# Patient Record
Sex: Female | Born: 1950 | Race: Black or African American | Hispanic: No | Marital: Single | Smoking: Never smoker
Health system: Southern US, Community
[De-identification: ages and names within clinical notes are randomized; demographics above are authoritative.]

## PROBLEM LIST (undated history)

## (undated) DIAGNOSIS — M1990 Unspecified osteoarthritis, unspecified site: Secondary | ICD-10-CM

## (undated) DIAGNOSIS — E785 Hyperlipidemia, unspecified: Secondary | ICD-10-CM

## (undated) DIAGNOSIS — K219 Gastro-esophageal reflux disease without esophagitis: Secondary | ICD-10-CM

## (undated) DIAGNOSIS — M109 Gout, unspecified: Secondary | ICD-10-CM

## (undated) DIAGNOSIS — C50919 Malignant neoplasm of unspecified site of unspecified female breast: Secondary | ICD-10-CM

## (undated) DIAGNOSIS — I10 Essential (primary) hypertension: Secondary | ICD-10-CM

## (undated) DIAGNOSIS — D249 Benign neoplasm of unspecified breast: Secondary | ICD-10-CM

## (undated) HISTORY — PX: ABDOMINAL HYSTERECTOMY: SHX81

## (undated) HISTORY — PX: CYST EXCISION: SHX5701

## (undated) HISTORY — DX: Unspecified osteoarthritis, unspecified site: M19.90

## (undated) HISTORY — DX: Malignant neoplasm of unspecified site of unspecified female breast: C50.919

## (undated) HISTORY — PX: KNEE SURGERY: SHX244

## (undated) HISTORY — DX: Hyperlipidemia, unspecified: E78.5

---

## 1999-02-04 ENCOUNTER — Emergency Department (HOSPITAL_COMMUNITY): Admission: EM | Admit: 1999-02-04 | Discharge: 1999-02-04 | Payer: Self-pay | Admitting: *Deleted

## 2000-02-29 ENCOUNTER — Other Ambulatory Visit: Admission: RE | Admit: 2000-02-29 | Discharge: 2000-02-29 | Payer: Self-pay | Admitting: Obstetrics

## 2000-03-15 ENCOUNTER — Encounter: Payer: Self-pay | Admitting: Obstetrics

## 2000-03-15 ENCOUNTER — Ambulatory Visit (HOSPITAL_COMMUNITY): Admission: RE | Admit: 2000-03-15 | Discharge: 2000-03-15 | Payer: Self-pay | Admitting: Obstetrics

## 2003-09-16 ENCOUNTER — Emergency Department (HOSPITAL_COMMUNITY): Admission: EM | Admit: 2003-09-16 | Discharge: 2003-09-16 | Payer: Self-pay | Admitting: Emergency Medicine

## 2003-12-09 ENCOUNTER — Encounter: Admission: RE | Admit: 2003-12-09 | Discharge: 2003-12-09 | Payer: Self-pay | Admitting: Family Medicine

## 2004-08-26 ENCOUNTER — Emergency Department (HOSPITAL_COMMUNITY): Admission: EM | Admit: 2004-08-26 | Discharge: 2004-08-26 | Payer: Self-pay | Admitting: Emergency Medicine

## 2005-08-12 ENCOUNTER — Ambulatory Visit: Payer: Self-pay | Admitting: Internal Medicine

## 2005-08-13 ENCOUNTER — Ambulatory Visit (HOSPITAL_COMMUNITY): Admission: RE | Admit: 2005-08-13 | Discharge: 2005-08-13 | Payer: Self-pay | Admitting: Internal Medicine

## 2005-08-16 ENCOUNTER — Ambulatory Visit: Payer: Self-pay | Admitting: Internal Medicine

## 2005-10-12 ENCOUNTER — Ambulatory Visit: Payer: Self-pay | Admitting: Family Medicine

## 2005-11-03 ENCOUNTER — Encounter: Admission: RE | Admit: 2005-11-03 | Discharge: 2005-11-25 | Payer: Self-pay | Admitting: Orthopedic Surgery

## 2005-12-03 ENCOUNTER — Ambulatory Visit: Payer: Self-pay | Admitting: Family Medicine

## 2005-12-08 ENCOUNTER — Ambulatory Visit: Payer: Self-pay | Admitting: Family Medicine

## 2005-12-23 ENCOUNTER — Ambulatory Visit (HOSPITAL_COMMUNITY): Admission: RE | Admit: 2005-12-23 | Discharge: 2005-12-23 | Payer: Self-pay | Admitting: Internal Medicine

## 2005-12-24 ENCOUNTER — Ambulatory Visit: Payer: Self-pay | Admitting: Family Medicine

## 2006-01-24 ENCOUNTER — Ambulatory Visit: Payer: Self-pay | Admitting: Family Medicine

## 2006-03-28 ENCOUNTER — Ambulatory Visit: Payer: Self-pay | Admitting: Family Medicine

## 2006-07-28 ENCOUNTER — Emergency Department (HOSPITAL_COMMUNITY): Admission: EM | Admit: 2006-07-28 | Discharge: 2006-07-28 | Payer: Self-pay | Admitting: Family Medicine

## 2007-01-18 ENCOUNTER — Encounter (INDEPENDENT_AMBULATORY_CARE_PROVIDER_SITE_OTHER): Payer: Self-pay | Admitting: Family Medicine

## 2007-03-07 DIAGNOSIS — I10 Essential (primary) hypertension: Secondary | ICD-10-CM | POA: Insufficient documentation

## 2007-03-07 DIAGNOSIS — E785 Hyperlipidemia, unspecified: Secondary | ICD-10-CM | POA: Insufficient documentation

## 2007-03-07 DIAGNOSIS — M62838 Other muscle spasm: Secondary | ICD-10-CM | POA: Insufficient documentation

## 2007-03-07 DIAGNOSIS — M25519 Pain in unspecified shoulder: Secondary | ICD-10-CM | POA: Insufficient documentation

## 2007-04-28 ENCOUNTER — Telehealth (INDEPENDENT_AMBULATORY_CARE_PROVIDER_SITE_OTHER): Payer: Self-pay | Admitting: *Deleted

## 2007-04-28 ENCOUNTER — Ambulatory Visit: Payer: Self-pay | Admitting: Internal Medicine

## 2007-05-03 ENCOUNTER — Ambulatory Visit: Payer: Self-pay | Admitting: *Deleted

## 2007-05-03 ENCOUNTER — Ambulatory Visit: Payer: Self-pay | Admitting: Family Medicine

## 2007-05-03 LAB — CONVERTED CEMR LAB
ALT: 26 units/L (ref 0–35)
AST: 15 units/L (ref 0–37)
Albumin: 4.7 g/dL (ref 3.5–5.2)
Alkaline Phosphatase: 72 units/L (ref 39–117)
BUN: 24 mg/dL — ABNORMAL HIGH (ref 6–23)
Bilirubin Urine: NEGATIVE
Blood in Urine, dipstick: NEGATIVE
CO2: 23 meq/L (ref 19–32)
Calcium: 9.3 mg/dL (ref 8.4–10.5)
Chloride: 104 meq/L (ref 96–112)
Cholesterol: 206 mg/dL — ABNORMAL HIGH (ref 0–200)
Creatinine, Ser: 0.79 mg/dL (ref 0.40–1.20)
Glucose, Bld: 92 mg/dL (ref 70–99)
Glucose, Urine, Semiquant: NEGATIVE
HDL: 53 mg/dL (ref >39)
Ketones, urine, test strip: NEGATIVE
LDL Cholesterol: 113 mg/dL — ABNORMAL HIGH (ref 0–99)
Nitrite: NEGATIVE
Potassium: 4.5 meq/L (ref 3.5–5.3)
Sodium: 140 meq/L (ref 135–145)
Specific Gravity, Urine: 1.015
Total Bilirubin: 1.1 mg/dL (ref 0.3–1.2)
Total CHOL/HDL Ratio: 3.9
Total Protein: 7.7 g/dL (ref 6.0–8.3)
Triglycerides: 201 mg/dL — ABNORMAL HIGH (ref <150)
Urobilinogen, UA: NEGATIVE
VLDL: 40 mg/dL (ref 0–40)
WBC Urine, dipstick: NEGATIVE
pH: 5

## 2007-11-20 ENCOUNTER — Emergency Department (HOSPITAL_COMMUNITY): Admission: EM | Admit: 2007-11-20 | Discharge: 2007-11-20 | Payer: Self-pay | Admitting: Emergency Medicine

## 2008-05-13 ENCOUNTER — Telehealth (INDEPENDENT_AMBULATORY_CARE_PROVIDER_SITE_OTHER): Payer: Self-pay | Admitting: *Deleted

## 2008-06-05 ENCOUNTER — Ambulatory Visit: Payer: Self-pay | Admitting: Family Medicine

## 2008-06-05 LAB — CONVERTED CEMR LAB
ALT: 20 units/L (ref 0–35)
AST: 16 units/L (ref 0–37)
Albumin: 4.6 g/dL (ref 3.5–5.2)
Alkaline Phosphatase: 82 units/L (ref 39–117)
BUN: 29 mg/dL — ABNORMAL HIGH (ref 6–23)
Basophils Absolute: 0 10*3/uL (ref 0.0–0.1)
Basophils Relative: 1 % (ref 0–1)
CO2: 23 meq/L (ref 19–32)
Calcium: 9.4 mg/dL (ref 8.4–10.5)
Chloride: 105 meq/L (ref 96–112)
Cholesterol: 185 mg/dL (ref 0–200)
Creatinine, Ser: 0.87 mg/dL (ref 0.40–1.20)
Eosinophils Absolute: 0.1 10*3/uL (ref 0.0–0.7)
Eosinophils Relative: 3 % (ref 0–5)
Glucose, Bld: 102 mg/dL — ABNORMAL HIGH (ref 70–99)
HCT: 37.2 % (ref 36.0–46.0)
HDL: 49 mg/dL (ref >39)
Hemoglobin: 12.1 g/dL (ref 12.0–15.0)
LDL Cholesterol: 104 mg/dL — ABNORMAL HIGH (ref 0–99)
Lymphocytes Relative: 38 % (ref 12–46)
Lymphs Abs: 1.7 10*3/uL (ref 0.7–4.0)
MCHC: 32.5 g/dL (ref 30.0–36.0)
MCV: 97.9 fL (ref 78.0–100.0)
Monocytes Absolute: 0.4 10*3/uL (ref 0.1–1.0)
Monocytes Relative: 8 % (ref 3–12)
Neutro Abs: 2.3 10*3/uL (ref 1.7–7.7)
Neutrophils Relative %: 50 % (ref 43–77)
Platelets: 258 10*3/uL (ref 150–400)
Potassium: 4.5 meq/L (ref 3.5–5.3)
RBC: 3.8 M/uL — ABNORMAL LOW (ref 3.87–5.11)
RDW: 12.7 % (ref 11.5–15.5)
Sodium: 140 meq/L (ref 135–145)
TSH: 0.853 microintl units/mL (ref 0.350–4.50)
Total Bilirubin: 0.7 mg/dL (ref 0.3–1.2)
Total CHOL/HDL Ratio: 3.8
Total Protein: 7.7 g/dL (ref 6.0–8.3)
Triglycerides: 160 mg/dL — ABNORMAL HIGH (ref <150)
VLDL: 32 mg/dL (ref 0–40)
WBC: 4.5 10*3/uL (ref 4.0–10.5)

## 2008-06-06 ENCOUNTER — Encounter (INDEPENDENT_AMBULATORY_CARE_PROVIDER_SITE_OTHER): Payer: Self-pay | Admitting: Family Medicine

## 2008-06-07 ENCOUNTER — Ambulatory Visit (HOSPITAL_COMMUNITY): Admission: RE | Admit: 2008-06-07 | Discharge: 2008-06-07 | Payer: Self-pay | Admitting: Family Medicine

## 2008-07-03 ENCOUNTER — Encounter (INDEPENDENT_AMBULATORY_CARE_PROVIDER_SITE_OTHER): Payer: Self-pay | Admitting: Family Medicine

## 2008-07-16 ENCOUNTER — Encounter (INDEPENDENT_AMBULATORY_CARE_PROVIDER_SITE_OTHER): Payer: Self-pay | Admitting: Family Medicine

## 2008-09-10 ENCOUNTER — Encounter (INDEPENDENT_AMBULATORY_CARE_PROVIDER_SITE_OTHER): Payer: Self-pay | Admitting: Family Medicine

## 2008-09-10 ENCOUNTER — Ambulatory Visit: Payer: Self-pay | Admitting: Family Medicine

## 2008-09-10 LAB — CONVERTED CEMR LAB
Bilirubin Urine: NEGATIVE
Blood in Urine, dipstick: NEGATIVE
Glucose, Urine, Semiquant: NEGATIVE
Nitrite: NEGATIVE
Protein, U semiquant: NEGATIVE
Specific Gravity, Urine: 1.02
Urobilinogen, UA: 0.2
WBC Urine, dipstick: NEGATIVE
pH: 5

## 2008-09-16 ENCOUNTER — Encounter (INDEPENDENT_AMBULATORY_CARE_PROVIDER_SITE_OTHER): Payer: Self-pay | Admitting: Family Medicine

## 2009-05-23 ENCOUNTER — Telehealth: Payer: Self-pay | Admitting: Physician Assistant

## 2009-05-30 ENCOUNTER — Ambulatory Visit: Payer: Self-pay | Admitting: Physician Assistant

## 2009-09-26 ENCOUNTER — Ambulatory Visit: Payer: Self-pay | Admitting: Physician Assistant

## 2009-09-26 DIAGNOSIS — R1013 Epigastric pain: Secondary | ICD-10-CM

## 2009-09-26 DIAGNOSIS — K3189 Other diseases of stomach and duodenum: Secondary | ICD-10-CM | POA: Insufficient documentation

## 2009-09-26 LAB — CONVERTED CEMR LAB
ALT: 13 units/L (ref 0–35)
AST: 14 units/L (ref 0–37)
Albumin: 4.4 g/dL (ref 3.5–5.2)
Alkaline Phosphatase: 58 units/L (ref 39–117)
BUN: 18 mg/dL (ref 6–23)
Basophils Absolute: 0 10*3/uL (ref 0.0–0.1)
Basophils Relative: 0 % (ref 0–1)
Bilirubin Urine: NEGATIVE
Blood in Urine, dipstick: NEGATIVE
CO2: 25 meq/L (ref 19–32)
Calcium: 8.9 mg/dL (ref 8.4–10.5)
Chloride: 104 meq/L (ref 96–112)
Creatinine, Ser: 0.97 mg/dL (ref 0.40–1.20)
Eosinophils Absolute: 0.2 10*3/uL (ref 0.0–0.7)
Eosinophils Relative: 3 % (ref 0–5)
Glucose, Bld: 71 mg/dL (ref 70–99)
Glucose, Urine, Semiquant: NEGATIVE
HCT: 37.7 % (ref 36.0–46.0)
Hemoglobin: 12.1 g/dL (ref 12.0–15.0)
Ketones, urine, test strip: NEGATIVE
Lymphocytes Relative: 34 % (ref 12–46)
Lymphs Abs: 1.9 10*3/uL (ref 0.7–4.0)
MCHC: 32.1 g/dL (ref 30.0–36.0)
MCV: 96.2 fL (ref 78.0–100.0)
Monocytes Absolute: 0.4 10*3/uL (ref 0.1–1.0)
Monocytes Relative: 7 % (ref 3–12)
Neutro Abs: 3.1 10*3/uL (ref 1.7–7.7)
Neutrophils Relative %: 56 % (ref 43–77)
Nitrite: NEGATIVE
Platelets: 264 10*3/uL (ref 150–400)
Potassium: 4 meq/L (ref 3.5–5.3)
Protein, U semiquant: NEGATIVE
RBC: 3.92 M/uL (ref 3.87–5.11)
RDW: 12.9 % (ref 11.5–15.5)
Rapid HIV Screen: NEGATIVE
Sodium: 141 meq/L (ref 135–145)
Specific Gravity, Urine: >=1.03
TSH: 1.095 microintl units/mL (ref 0.350–4.500)
Total Bilirubin: 0.6 mg/dL (ref 0.3–1.2)
Total Protein: 7.3 g/dL (ref 6.0–8.3)
Urobilinogen, UA: 0.2
WBC Urine, dipstick: NEGATIVE
WBC: 5.5 10*3/uL (ref 4.0–10.5)
pH: 6

## 2009-09-29 ENCOUNTER — Ambulatory Visit (HOSPITAL_COMMUNITY): Admission: RE | Admit: 2009-09-29 | Discharge: 2009-09-29 | Payer: Self-pay | Admitting: Internal Medicine

## 2009-09-30 ENCOUNTER — Ambulatory Visit: Payer: Self-pay | Admitting: Physician Assistant

## 2009-10-01 ENCOUNTER — Encounter: Payer: Self-pay | Admitting: Physician Assistant

## 2009-10-01 LAB — CONVERTED CEMR LAB
Cholesterol, target level: 200 mg/dL
Cholesterol: 182 mg/dL (ref 0–200)
HDL goal, serum: 40 mg/dL
HDL: 50 mg/dL (ref >39)
LDL Cholesterol: 101 mg/dL — ABNORMAL HIGH (ref 0–99)
LDL Goal: 130 mg/dL
Total CHOL/HDL Ratio: 3.6
Triglycerides: 154 mg/dL — ABNORMAL HIGH (ref <150)
VLDL: 31 mg/dL (ref 0–40)

## 2010-05-19 NOTE — Letter (Signed)
Summary: COLONOSCOPY  COLONOSCOPY   Imported By: Arta Bruce 08/15/2008 12:37:28  _____________________________________________________________________  External Attachment:    Type:   Image     Comment:   External Document  Appended Document: COLONOSCOPY    Clinical Lists Changes  Observations: Added new observation of COLONRECACT: Further recommendations pending biopsy results.  Repeat colonoscopy in 5 years. Repeat colonoscopy in 10 years.   (07/16/2008 9:14) Added new observation of COLONOSCOPY:  Results: Polyp.  Results: Hemorrhoids.     Results: Diverticulosis.        (07/16/2008 9:14)       Colonoscopy  Procedure date:  07/16/2008  Findings:       Results: Polyp.  Results: Hemorrhoids.     Results: Diverticulosis.         Comments:      Further recommendations pending biopsy results.  Repeat colonoscopy in 5 years. Repeat colonoscopy in 10 years.

## 2010-05-19 NOTE — Progress Notes (Signed)
Summary: med request  Phone Note Call from Patient Call back at [facility] Phone [phone]   Caller: Patient Summary of Call: THE PT IS OUT FROM HER CHOLESTEROL MEDICATION AND SHE WOULD LIKE TO GET SOME THAT CAN LAST UNTIL HER VISIT.  [facility] ([address]) [phone] DR Barbaraann Barthel Initial call taken by: Manon Hilding,  May 13, 2008 4:08 PM  Follow-up for Phone Call        forward to Miran Kautzman Follow-up by: [place] Shannon,  May 14, 2008 9:40 AM  Additional Follow-up for Phone Call Additional follow up Details #1::        Pt has not been seen in 1 year. She must come to fasting labs in next week....FLP,CMET. This is last refill she will receive without labs.Done via escript request(separate document) Additional Follow-up by: Beverley Fiedler MD,  May 15, 2008 3:04 PM    Additional Follow-up for Phone Call Additional follow up Details #2::    spoke with patient and she already had an appt scheduled to f/u with Lynetta Tomczak on Feb 17. pt advised to come in fasting that day and she can still pick up her refill on medications but she needs to keep f/u appt with Justen Fonda........ Follow-up by: Mikey College CMA,  May 16, 2008 10:47 AM

## 2010-05-19 NOTE — Assessment & Plan Note (Signed)
Summary: CPP////KT   Vital Signs:  Patient profile:   60 year old female Height:      60 inches Weight:      196 pounds Temp:     97.7 degrees F oral Pulse rate:   60 / minute Pulse rhythm:   regular Resp:     18 per minute BP sitting:   120 / 78  (left arm) Cuff size:   regular  Vitals Entered By: [place] Shannon (Sep 10, 2008 2:08 PM)  History of Present Illness: Here for CPP. Had hysterectomy for fibroids in past. Denies vaginal bleeding or incontinence issues. Widow. Not sexually active. Has had her blood work,mammogram, and colonoscopy. Need Path report from GI,Dr.Hung. Pt had one polyp and reports that Dr.Hung told her it was benign.  Habits & Providers     Tobacco Status: never     Drug Use: no  Current Medications (verified): 1)  Benazepril-Hydrochlorothiazide 10-12.5 Mg  Tabs (Benazepril-Hydrochlorothiazide) .Marland Kitchen.. 1 Tab By Mouth Daily 2)  Pravachol 40 Mg  Tabs (Pravastatin Sodium) .Marland Kitchen.. 1 Tab By Mouth Daily 3)  Diclofenac Sodium 75 Mg Tbec (Diclofenac Sodium) .Marland Kitchen.. 1 By Mouth Two Times A Day As Needed 4)  Nabumetone 750 Mg Tabs (Nabumetone) .Marland Kitchen.. 1 By Mouth Two Times A Day As Needed 5)  Aspirin Adult Low Strength 81 Mg Tbec (Aspirin) .Marland Kitchen.. 1 By Mouth Once Daily  Allergies (verified): No Known Drug Allergies  Past History:  Past Medical History:    Hypertension    Hyperlipidemia     (06/05/2008)  Past Surgical History:    s/p right knee arthroscopy 2008.Marland KitchenMarland KitchenDr.Duda.    s/p TAH 1990(fibroids)     (06/05/2008)  Social History:    Never Smoked    Alcohol use-yes...occasional beer on weekend.    Drug use-yes...occasional MJ    Widow/Widower    No sexual partner.    Alcohol use-yes...occasional 4 beer on weekend.    Drug use-no     (09/10/2008)  Social History:    Never Smoked    Alcohol use-yes...occasional beer on weekend.    Drug use-yes...occasional MJ    Widow/Widower    No sexual partner.    Alcohol use-yes...occasional 4 beer on weekend.    Drug  use-no    Drug Use:  no  Review of Systems  The patient denies anorexia, chest pain, syncope, dyspnea on exertion, peripheral edema, prolonged cough, abdominal pain, melena, and hematochezia.   ENT:  Denies decreased hearing. CV:  Denies chest pain or discomfort, fainting, palpitations, and shortness of breath with exertion. Resp:  Denies cough. GI:  Denies abdominal pain, bloody stools, change in bowel habits, dark tarry stools, nausea, and vomiting; some heartburn with greasy foods.. GU:  Denies abnormal vaginal bleeding, discharge, and dysuria. Derm:  Denies poor wound healing and rash; No new rashes or changes in moles.. Neuro:  Denies falling down. Psych:  Denies anxiety and depression.  Physical Exam  General:  Well-developed,well-nourished,in no acute distress; alert,appropriate and cooperative throughout examination Head:  Normocephalic and atraumatic without obvious abnormalities. No apparent alopecia or balding. Eyes:  pupils equal, pupils round, pupils reactive to light, and no injection.   Ears:  External ear exam shows no significant lesions or deformities.  Otoscopic examination reveals clear canals, tympanic membranes are intact bilaterally without bulging, retraction, inflammation or discharge. Hearing is grossly normal bilaterally. Nose:  External nasal examination shows no deformity or inflammation. Nasal mucosa are pink and moist without lesions or exudates. Mouth:  Oral mucosa  and oropharynx without lesions or exudates.  Has had lost teeth but no evidence of current caries. Pt says last cleaned 2 years ago and she plans to return to her dentist. Neck:  supple, no thyromegaly, and no carotid bruits.   Breasts:  No mass, nodules, thickening, tenderness, bulging, retraction, inflamation, nipple discharge or skin changes noted.   Lungs:  Normal respiratory effort, chest expands symmetrically. Lungs are clear to auscultation, no crackles or wheezes. Heart:  Normal rate and  regular rhythm. S1 and S2 normal without gallop, murmur, click, rub or other extra sounds. Abdomen:  Bowel sounds positive,abdomen soft and non-tender without masses, organomegaly or hernias noted. Rectal:  deferred Genitalia:  Pelvic Exam:        External: normal female genitalia without lesions or masses        Vagina: normal without lesions or masses        Cervix: absent        Adnexa: normal bimanual exam without masses or fullness        Uterus: absent        Pap smear: vaginal wall smear. Msk:  No deformity or scoliosis noted of thoracic or lumbar spine.   Extremities:  No CCE Neurologic:  alert & oriented X3 and cranial nerves II-XII intact grossly.  Skin:  Intact without suspicious lesions or rashes Cervical Nodes:  No lymphadenopathy noted Axillary Nodes:  No palpable lymphadenopathy Psych:  Oriented X3, memory intact for recent and remote, normally interactive, good eye contact, not anxious appearing, and not depressed appearing.     Impression & Recommendations:  Problem # 1:  EXAMINATION, ROUTINE MEDICAL (ICD-V70.0) Had Mammogram. Had screening colonoscopy. Had bloodwork 05/2008. Tdap 05/2008. Orders: Thin Prep Pap ([id number]) Pap Smear, Thin Prep ( Collection of) ([id number]) UA Dipstick w/o Micro (automated)  (81003)  Problem # 2:  HYPERTENSION (ICD-401.9) At goal. Her updated medication list for this problem includes:    Benazepril-hydrochlorothiazide 10-12.5 Mg Tabs (Benazepril-hydrochlorothiazide) .Marland Kitchen... 1 tab by mouth daily  Problem # 3:  HYPERLIPIDEMIA (ICD-272.4) Stable.LDL 104. Her updated medication list for this problem includes:    Pravachol 40 Mg Tabs (Pravastatin sodium) .Marland Kitchen... 1 tab by mouth daily  Complete Medication List: 1)  Benazepril-hydrochlorothiazide 10-12.5 Mg Tabs (Benazepril-hydrochlorothiazide) .Marland Kitchen.. 1 tab by mouth daily 2)  Pravachol 40 Mg Tabs (Pravastatin sodium) .Marland Kitchen.. 1 tab by mouth daily 3)  Nabumetone 750 Mg Tabs (Nabumetone) .Marland Kitchen.. 1 by mouth  two times a day as needed 4)  Aspirin Adult Low Strength 81 Mg Tbec (Aspirin) .Marland Kitchen.. 1 by mouth once daily  Patient Instructions: 1)  Next visit  ~ October 2010 for BP check and fasting labs Prescriptions: PRAVACHOL 40 MG  TABS (PRAVASTATIN SODIUM) 1 tab by mouth daily  #30 Each x 5   Entered and Authorized by:   Beverley Fiedler MD   Signed by:   Beverley Fiedler MD on 09/10/2008   Method used:   Electronically to        [facility] [phone]* (retail)       [address]       [place], [place]  [address]       Ph: [phone]       Fax: [phone]   RxID:   [id number] BENAZEPRIL-HYDROCHLOROTHIAZIDE 10-12.5 MG  TABS (BENAZEPRIL-HYDROCHLOROTHIAZIDE) 1 tab by mouth daily  #30 Each x 5   Entered and Authorized by:   Beverley Fiedler MD   Signed by:   Beverley Fiedler MD on 09/10/2008   Method  used:   Electronically to        [facility] [phone]* (retail)       [address]       [place], [place]  [address]       Ph: [phone]       Fax: [phone]   RxID:   [id number]   Laboratory Results   Urine Tests  Date/Time Received: Sep 10, 2008 2:49 PM   Routine Urinalysis   Glucose: negative   (Normal Range: Negative) Bilirubin: negative   (Normal Range: Negative) Ketone: trace (5)   (Normal Range: Negative) Spec. Gravity: 1.020   (Normal Range: 1.003-1.035) Blood: negative   (Normal Range: Negative) pH: 5.0   (Normal Range: 5.0-8.0) Protein: negative   (Normal Range: Negative) Urobilinogen: 0.2   (Normal Range: 0-1) Nitrite: negative   (Normal Range: Negative) Leukocyte Esterace: negative   (Normal Range: Negative)

## 2010-05-19 NOTE — Letter (Signed)
Summary: [facility]  [facility]   Imported By: Arta Bruce 11/08/2008 11:38:51  _____________________________________________________________________  External Attachment:    Type:   Image     Comment:   External Document

## 2010-05-19 NOTE — Assessment & Plan Note (Signed)
Summary: RENEW MEDICATION/GK   Vital Signs:  Patient Profile:   60 Years Old Female Height:     60 inches Weight:      198.5 pounds BMI:     38.91 Pulse rhythm:   regular BP sitting:   120 / 82  (left arm) Cuff size:   regular  Pt. in pain?   yes    Location:   lt shoulder    Intensity:   7  Vitals Entered By: Vesta Mixer CMA (June 05, 2008 9:03 AM)              Is Patient Diabetic? No  Does patient need assistance? Ambulation Normal     Chief Complaint:  Left shoulder has been hurting x 2 months worse at night when she tries to sleep.Marland Kitchen  History of Present Illness: Pt not seen in > 1 year. Here for fasting labs and med refills. No acute issues.Reports compliance with her meds. No recent labs. Behind on multiple health maintenance issues.Does inform MD that she has Medicare(shows card).  Has 2 NSAID prescriptions from 2 orthopedists. MD discussed with her that she should not take both NSAIDS and should call her Ortho about which one he wishes her to take. Pt has seen her orthopedist for left shoulder(Dr.Duda) and was told it was "a nerve" and surgery was not recommended.Last saw Dr.Duda in 2009.    Prior Medications Reviewed Using: Medication Bottles  Current Allergies: No known allergies   Past Medical History:    Reviewed history and no changes required:       Hypertension       Hyperlipidemia  Past Surgical History:    Reviewed history and no changes required:       s/p right knee arthroscopy 2008.Marland KitchenMarland KitchenDr.Duda.       s/p TAH 1990(fibroids)   Social History:    Never Smoked    Alcohol use-yes...occasional beer on weekend.    Drug use-yes...occasional MJ   Risk Factors:  Tobacco use:  never Drug use:  yes Alcohol use:  yes   Review of Systems  The patient denies anorexia, chest pain, dyspnea on exertion, and prolonged cough.     Physical Exam  General:     Well-developed,well-nourished,in no acute distress; alert,appropriate and  cooperative throughout examination Lungs:     Normal respiratory effort, chest expands symmetrically. Lungs are clear to auscultation, no crackles or wheezes. Heart:     Normal rate and regular rhythm. S1 and S2 normal without gallop, murmur, click, rub or other extra sounds.    Impression & Recommendations:  Problem # 1:  HYPERTENSION (ICD-401.9)  Her updated medication list for this problem includes:    Benazepril-hydrochlorothiazide 10-12.5 Mg Tabs (Benazepril-hydrochlorothiazide) .Marland Kitchen... 1 tab by mouth daily  Orders: T-General Health Panel (CBCD, CMP, TSH) ([id number])   Problem # 2:  HYPERLIPIDEMIA (ICD-272.4)  Her updated medication list for this problem includes:    Pravachol 40 Mg Tabs (Pravastatin sodium) .Marland Kitchen... 1 tab by mouth daily  Orders: T-Lipid Profile ([id number])   Problem # 3:  Preventive Health Care (ICD-V70.0) Behind on multiple areas. Referral to GI for screening colonoscopy. Mammogram. Stool cards x 3. Tdap. F/U CPP in 4 months  Complete Medication List: 1)  Benazepril-hydrochlorothiazide 10-12.5 Mg Tabs (Benazepril-hydrochlorothiazide) .Marland Kitchen.. 1 tab by mouth daily 2)  Pravachol 40 Mg Tabs (Pravastatin sodium) .Marland Kitchen.. 1 tab by mouth daily 3)  Diclofenac Sodium 75 Mg Tbec (Diclofenac sodium) .Marland Kitchen.. 1 by mouth two times a day as needed 4)  Nabumetone 750 Mg Tabs (Nabumetone) .Marland Kitchen.. 1 by mouth two times a day as needed 5)  Aspirin Adult Low Strength 81 Mg Tbec (Aspirin) .Marland Kitchen.. 1 by mouth once daily  Other Orders: Gastroenterology Referral (GI) Mammogram (Screening) (Mammo)   Patient Instructions: 1)  You got a Tdap shot today. 2)  We are referring you to get a colonoscopy. 3)  Get yout mammogrm. 4)  See Dr.Rankins for CPP in 4 months   Prescriptions: PRAVACHOL 40 MG  TABS (PRAVASTATIN SODIUM) 1 tab by mouth daily  #30 Each x 4   Entered and Authorized by:   Beverley Fiedler MD   Signed by:   Beverley Fiedler MD on 06/05/2008   Method used:   Print then  Give to Patient   RxID:   [id number] BENAZEPRIL-HYDROCHLOROTHIAZIDE 10-12.5 MG  TABS (BENAZEPRIL-HYDROCHLOROTHIAZIDE) 1 tab by mouth daily  #30 Each x 4   Entered and Authorized by:   Beverley Fiedler MD   Signed by:   Beverley Fiedler MD on 06/05/2008   Method used:   Print then Give to Patient   RxID:   [id number]   Appended Document: RENEW MEDICATION/GK   Tetanus/Td Vaccine    Vaccine Type: Tdap    Site: right deltoid    Mfr: Sanofi Pasteur    Dose: 0.5 ml    Route: IM    Given by: Vesta Mixer CMA    Exp. Date: 06/14/2010    Lot #: [id number]    VIS given: 03/07/07 version given June 05, 2008.   Appended Document: hemoccult results  Laboratory Results    Stool - Occult Blood Hemmoccult #1: negative Date: 06/19/2008 Hemoccult #2: negative Date: 06/19/2008 Hemoccult #3: negative Date: 06/19/2008

## 2010-05-19 NOTE — Letter (Signed)
Summary: *HSN Results Follow up  [facility]  [address] [place], [place] [address]   Phone: [phone]  Fax: [phone]      06/06/2008   Sandy Morales [address] [place], [place]  [address]   Dear  Ms. [facility],                            ____S.Drinkard,FNP   ____D. Gore,FNP       ____B. McPherson,MD   __X__V. Margarie Mcguirt,MD    ____E. Mulberry,MD    ____N. Daphine Deutscher, FNP  ____D. Reche Dixon, MD    ____K. Philipp Deputy, MD    ____Other     This letter is to inform you that your recent test(s):  _______Pap Smear    __x____Lab Test     _______X-ray    ___X____ is within acceptable limits  _______ requires a medication change  _______ requires a follow-up lab visit  _______ requires a follow-up visit with your provider   Comments:       _________________________________________________________ If you have any questions, please contact our office                     Sincerely,  Beverley Fiedler MD [facility]

## 2010-05-19 NOTE — Assessment & Plan Note (Signed)
Summary: Sandy Morales PT//OUT OF BPS MEDS//KT   Vital Signs:  Patient Profile:   60 Years Old Female Temp:     203 degrees F 97.6 Pulse rate:   76 / minute Pulse rhythm:   regular Resp:     18 per minute BP sitting:   136 / 86  (left arm) Cuff size:   large  Pt. in pain?   yes    Location:   knee    Intensity:   6  Vitals Entered By: Vesta Mixer CMA (April 28, 2007 2:44 PM)              Is Patient Diabetic? No  Does patient need assistance? Ambulation Normal     Chief Complaint:  needs refill on meds out x 8 days [facility] [address]  History of Present Illness: 1.  Hypertension:  Out of meds for 8 days.  States did check pressures at pharmacy--sound borderline.  Pt. not a good historian regarding numbers obtained.  Minimal exercise.  Not really working on diet.  Describes a poor diet.  3 sodas daily.  Few vegetables.  2.  Hypercholesterolemia:  See above.  Little in way of fried food.  Current Allergies: No known allergies     Risk Factors:  Tobacco use:  never    Physical Exam  General:     obese. Lungs:     Normal respiratory effort, chest expands symmetrically. Lungs are clear to auscultation, no crackles or wheezes. Heart:     Normal rate and regular rhythm. S1 and S2 normal without gallop, murmur, click, rub or other extra sounds.  Radial pulses normal and equal.    Impression & Recommendations:  Problem # 1:  HYPERTENSION (ICD-401.9) Restart meds To return for fasting CMET. Her updated medication list for this problem includes:    Benazepril-hydrochlorothiazide 10-12.5 Mg Tabs (Benazepril-hydrochlorothiazide) .Marland Kitchen... 1 tab by mouth daily   Problem # 2:  HYPERLIPIDEMIA (ICD-272.4) Restart meds To return for FLP The following medications were removed from the medication list:    Crestor 10 Mg Tabs (Rosuvastatin calcium)  Her updated medication list for this problem includes:    Pravachol 40 Mg Tabs (Pravastatin sodium) .Marland Kitchen... 1 tab by mouth  daily   Complete Medication List: 1)  Benazepril-hydrochlorothiazide 10-12.5 Mg Tabs (Benazepril-hydrochlorothiazide) .Marland Kitchen.. 1 tab by mouth daily 2)  Pravachol 40 Mg Tabs (Pravastatin sodium) .Marland Kitchen.. 1 tab by mouth daily   Patient Instructions: 1)  Lab appt next week for fasting lipids, CMET, UA 2)  Call for CPP with Dr. Barbaraann Morales in next 4 months.    Prescriptions: PRAVACHOL 40 MG  TABS (PRAVASTATIN SODIUM) 1 tab by mouth daily  #30 x 4   Entered and Authorized by:   Julieanne Manson MD   Signed by:   Julieanne Manson MD on 04/28/2007   Method used:   Print then Give to Patient   RxID:   [id number] BENAZEPRIL-HYDROCHLOROTHIAZIDE 10-12.5 MG  TABS (BENAZEPRIL-HYDROCHLOROTHIAZIDE) 1 tab by mouth daily  #30 x 4   Entered and Authorized by:   Julieanne Manson MD   Signed by:   Julieanne Manson MD on 04/28/2007   Method used:   Print then Give to Patient   RxID:   [id number]  ]

## 2010-05-19 NOTE — Assessment & Plan Note (Signed)
Summary: 4 MONTH FU FOR CPP///KT   Vital Signs:  Patient profile:   60 year old female Height:      60 inches Weight:      197 pounds BMI:     38.61 Temp:     97.5 degrees F oral Pulse rate:   66 / minute Pulse rhythm:   regular Resp:     18 per minute BP sitting:   104 / 73  (left arm) Cuff size:   regular  Vitals Entered By: Armenia Shannon (September 26, 2009 11:25 AM)  Primary Care Provider:  Tereso Newcomer PA-C   History of Present Illness: Here for CPE.  Health Maint: S/p Hyst.  Had pap last year.  Does not need for few years. No vaginal bleeding or discharge.   Due for Mammo. No FHx breast, colo or ovarian cancer. Had colo in 2010.  Had poly.  Due for repeat 2015 - 2020. Not taking calcium. PHQ9=0  GERD:  Has occ. dypepsia.  No dyphagia or odynophagia.  No melena or hematochezia.  + belching.  No water brash.  Took relative's Nexium which helped.     Hypertension History:      She denies chest pain, dyspnea with exertion, and peripheral edema.  She notes no problems with any antihypertensive medication side effects.        Positive major cardiovascular risk factors include female age 35 years old or older, hyperlipidemia, and hypertension.  Negative major cardiovascular risk factors include non-tobacco-user status.     Problems Prior to Update: 1)  Screening For Malignant Neoplasm, Cervix  (ICD-V76.2) 2)  Examination, Routine Medical  (ICD-V70.0) 3)  Screening For Mlig Neop, Breast, Nos  (ICD-V76.10) 4)  Hyperlipidemia  (ICD-272.4) 5)  Hypertension  (ICD-401.9) 6)  Shoulder Pain, Left  (ICD-719.41) 7)  Muscle Spasm, Trapezius Muscle, Left  (ICD-728.85)  Allergies: No Known Drug Allergies  Past History:  Past Medical History: Last updated: 06/05/2008 Hypertension Hyperlipidemia  Past Surgical History: Last updated: 06/05/2008 s/p right knee arthroscopy 2008.Marland KitchenMarland KitchenDr.Duda. s/p TAH 1990(fibroids)  Family History: no fhx colo, breast or ovarian cancer Family  History Hypertension - aunt, sis  Social History: Reviewed history from 09/10/2008 and no changes required. Never Smoked Alcohol use-yes...occasional beer on weekend. Drug use-yes...occasional MJ Widow/Widower No sexual partner. Alcohol use-yes...occasional 4 beer on weekend. Drug use-no  Review of Systems      See HPI General:  Denies chills and fever. Eyes:  will schedule eye appt soon. CV:  See HPI; Denies chest pain or discomfort and shortness of breath with exertion. Resp:  Denies cough. GI:  See HPI; Denies bloody stools and dark tarry stools. GU:  Denies dysuria and hematuria. MS:  Denies joint pain. Derm:  Denies rash. Neuro:  Denies headaches. Psych:  Denies depression and suicidal thoughts/plans. Heme:  Denies bleeding.  Physical Exam  General:  alert, well-developed, and well-nourished.   Head:  normocephalic and atraumatic.   Ears:  R ear normal and L ear normal.   Nose:  no external deformity.   Mouth:  pharynx pink and moist.   Neck:  supple, no thyromegaly, no carotid bruits, and no cervical lymphadenopathy.   Breasts:  skin/areolae normal, no masses, no abnormal thickening, no nipple discharge, no tenderness, and no adenopathy.   Lungs:  normal breath sounds, no crackles, and no wheezes.   Heart:  normal rate, regular rhythm, and no murmur.   Abdomen:  soft, non-tender, normal bowel sounds, and no hepatomegaly.   Rectal:  deferred Genitalia:  deferred  Msk:  normal ROM.   Pulses:  R posterior tibial normal, R dorsalis pedis normal, L posterior tibial normal, and L dorsalis pedis normal.   Extremities:  no edema Neurologic:  alert & oriented X3, cranial nerves II-XII intact, strength normal in all extremities, and DTRs symmetrical and normal.   Skin:  turgor normal.   Psych:  normally interactive and good eye contact.     Impression & Recommendations:  Problem # 1:  DYSPEPSIA (ICD-536.8) rx for pepcid as needed  Problem # 2:  HYPERTENSION  (ICD-401.9) Assessment: Improved  Her updated medication list for this problem includes:    Benazepril-hydrochlorothiazide 10-12.5 Mg Tabs (Benazepril-hydrochlorothiazide) .Marland Kitchen... 1 tab by mouth daily  Orders: T-Comprehensive Metabolic Panel (57846-96295) T-TSH (28413-24401)  Problem # 3:  HYPERLIPIDEMIA (ICD-272.4)  Her updated medication list for this problem includes:    Pravachol 40 Mg Tabs (Pravastatin sodium) .Marland Kitchen... 1 tab by mouth daily  Orders: T-Comprehensive Metabolic Panel (02725-36644)  Problem # 4:  EXAMINATION, ROUTINE MEDICAL (ICD-V70.0) PHQ9=0 not due for pap yet colo up todate imm's up to date  Orders: T-CBC w/Diff (03474-25956) T-TSH (38756-43329) UA Dipstick w/o Micro (manual) (51884) Rapid HIV  (92370) Mammogram (Screening) (Mammo)  Complete Medication List: 1)  Benazepril-hydrochlorothiazide 10-12.5 Mg Tabs (Benazepril-hydrochlorothiazide) .Marland Kitchen.. 1 tab by mouth daily 2)  Pravachol 40 Mg Tabs (Pravastatin sodium) .Marland Kitchen.. 1 tab by mouth daily 3)  Nabumetone 750 Mg Tabs (Nabumetone) .Marland Kitchen.. 1 by mouth two times a day as needed 4)  Aspirin Adult Low Strength 81 Mg Tbec (Aspirin) .Marland Kitchen.. 1 by mouth once daily 5)  Pepcid 20 Mg Tabs (Famotidine) .... Take 1 tablet by mouth two times a day as needed for indigestion  Hypertension Assessment/Plan:      The patient's hypertensive risk group is category B: At least one risk factor (excluding diabetes) with no target organ damage.  Her calculated 10 year risk of coronary heart disease is 7 %.  Today's blood pressure is 104/73.  Her blood pressure goal is < 140/90.  Patient Instructions: 1)  Schedule fasting Lipids.  Nothing to eat or drink after midnight except water.   2)  Avoid foods high in acid(tomatoes, citrus juices,spicy foods).Avoid eating within two hours of lying down or before exercising. Do not over eat: try smaller more frequent meals. Elevate head of bed twelve inches when sleeping.  3)  Please schedule a follow-up  appointment in 6 months with Athol Bolds for blood pressure.  Prescriptions: BENAZEPRIL-HYDROCHLOROTHIAZIDE 10-12.5 MG  TABS (BENAZEPRIL-HYDROCHLOROTHIAZIDE) 1 tab by mouth daily  #30 Each x 11   Entered and Authorized by:   Tereso Newcomer PA-C   Signed by:   Tereso Newcomer PA-C on 09/26/2009   Method used:   Print then Give to Patient   RxID:   [id number] PRAVACHOL 40 MG  TABS (PRAVASTATIN SODIUM) 1 tab by mouth daily  #30 Each x 11   Entered and Authorized by:   Tereso Newcomer PA-C   Signed by:   Tereso Newcomer PA-C on 09/26/2009   Method used:   Print then Give to Patient   RxID:   [id number] PEPCID 20 MG TABS (FAMOTIDINE) Take 1 tablet by mouth two times a day as needed for indigestion  #30 x 3   Entered and Authorized by:   Tereso Newcomer PA-C   Signed by:   Tereso Newcomer PA-C on 09/26/2009   Method used:   Print then Give to Patient   RxID:   [id number]  Laboratory Results   Urine Tests    Routine Urinalysis   Glucose: negative   (Normal Range: Negative) Bilirubin: negative   (Normal Range: Negative) Ketone: negative   (Normal Range: Negative) Spec. Gravity: >=1.030   (Normal Range: 1.003-1.035) Blood: negative   (Normal Range: Negative) pH: 6.0   (Normal Range: 5.0-8.0) Protein: negative   (Normal Range: Negative) Urobilinogen: 0.2   (Normal Range: 0-1) Nitrite: negative   (Normal Range: Negative) Leukocyte Esterace: negative   (Normal Range: Negative)    Date/Time Received: September 26, 2009 11:46 AM   Other Tests  Rapid HIV: negative

## 2010-05-19 NOTE — Progress Notes (Signed)
  Phone Note Outgoing Call   Summary of Call: Needs f/u visit. Come fasting for labs. Initial call taken by: Tereso Newcomer PA-C,  May 23, 2009 4:33 PM  Follow-up for Phone Call        pt has appt 2/11....... Follow-up by: Mikey College CMA,  May 23, 2009 4:44 PM    Prescriptions: PRAVACHOL 40 MG  TABS (PRAVASTATIN SODIUM) 1 tab by mouth daily  #30 Each x 5   Entered and Authorized by:   Tereso Newcomer PA-C   Signed by:   Tereso Newcomer PA-C on 05/23/2009   Method used:   Electronically to        [facility] [phone]* (retail)       [address]       [place], [place]  [address]       Ph: [phone]       Fax: [phone]   RxID:   [id number]

## 2010-05-19 NOTE — Progress Notes (Signed)
Summary: refill request  Phone Note Call from Patient Call back at [facility] Phone [phone]   Summary of Call: pt also needs a refill on benzapril/hctz... she is scheduled 2/11 to re-establish. pt states she uses [facility] Initial call taken by: Mikey College CMA,  May 23, 2009 4:46 PM  Follow-up for Phone Call        NEEDS BLOOD PRESSURE MED LEGS SWELLING//[phone] Follow-up by: Arta Bruce,  May 26, 2009 9:37 AM  Additional Follow-up for Phone Call Additional follow up Details #1::        forward to provider, last filled on 09-10-08 for #30 x 5 Additional Follow-up by: Armenia Shannon,  May 26, 2009 12:41 PM    Prescriptions: BENAZEPRIL-HYDROCHLOROTHIAZIDE 10-12.5 MG  TABS (BENAZEPRIL-HYDROCHLOROTHIAZIDE) 1 tab by mouth daily  #30 Each x 5   Entered and Authorized by:   Tereso Newcomer PA-C   Signed by:   Tereso Newcomer PA-C on 05/26/2009   Method used:   Electronically to        [facility] [phone]* (retail)       [address]       [place], [place]  [address]       Ph: [phone]       Fax: [phone]   RxID:   [id number]

## 2010-05-19 NOTE — Letter (Signed)
Summary: *HSN Results Follow up  [facility]  [address] [place], [place] [address]   Phone: [phone]  Fax: [phone]      09/16/2008   Sandy Morales [address] [place], [place]  [address]   Dear  [name],                            ____S.Drinkard,FNP   ____D. Gore,FNP       ____B. McPherson,MD   _X___V. Rankins,MD    ____E. Mulberry,MD    ____N. Daphine Deutscher, FNP  ____D. Reche Dixon, MD    ____K. Philipp Deputy, MD    ____Other  ***Your Pap report was normal except some vaginal yeast which is easy to treat with an over-the-counter vaginal yeast cream like Monistat***.   This letter is to inform you that your recent test(s):  ___X____Pap Smear    _______Lab Test     _______X-ray    ___X____ is within acceptable limits  _______ requires a medication change  _______ requires a follow-up lab visit  _______ requires a follow-up visit with your Jaleel Allen   Comments:       _________________________________________________________ If you have any questions, please contact our office                     Sincerely,  Beverley Fiedler MD [facility]

## 2010-05-19 NOTE — Progress Notes (Signed)
  Phone Note Call from Patient   Summary of Call: pt seen today. Initial call taken by: Mikey College CMA,  April 28, 2007 5:10 PM    04/28/07  4:43pm Left message for patient to return call to office   ..................................................................Marland KitchenLevon Hedger  April 28, 2007 4:44 PM

## 2010-05-19 NOTE — Letter (Signed)
Summary: *HSN Results Follow up  [facility]  [address] [place], [place] [address]   Phone: [phone]  Fax: [phone]      10/01/2009   Sandy Morales [address] [place], [place]  [address]   Dear  Ms. [facility],                            ____S.Drinkard,FNP   ____D. Gore,FNP       ____B. McPherson,MD   ____V. Rankins,MD    ____E. Mulberry,MD    ____N. Daphine Deutscher, FNP  ____D. Reche Dixon, MD    ____K. Philipp Deputy, MD    __x__S. Alben Spittle, PA-C     This letter is to inform you that your recent test(s):  _______Pap Smear    ___x____Lab Test     _______X-ray    ___x____ is within acceptable limits  _______ requires a medication change  _______ requires a follow-up lab visit  _______ requires a follow-up visit with your provider   Comments: Liver and kidney function, blood counts, thyroid are normal.  Cholesterol levels are good.  Continue taking your current medications.       _________________________________________________________ If you have any questions, please contact our office                     Sincerely,  Tereso Newcomer PA-C [facility]

## 2010-05-19 NOTE — Assessment & Plan Note (Signed)
Summary: OFFICE VISIT//GK   Vital Signs:  Patient profile:   61 year old female Height:      60 inches Weight:      192 pounds BMI:     37.63 Temp:     98.7 degrees F oral Pulse rate:   72 / minute Pulse rhythm:   regular Resp:     18 per minute BP sitting:   119 / 80  (left arm) Cuff size:   regular  Vitals Entered By: Armenia Shannon (May 30, 2009 11:03 AM) CC: Hypertension Management Is Patient Diabetic? No Pain Assessment Patient in pain? no       Does patient need assistance? Functional Status Self care Ambulation Normal   CC:  Hypertension Management.  History of Present Illness: Here for f/u on HTN and hyperlipidemia. Had a hard time getting meds.  Had some swelling in her legs when out of meds.  No dyspnea.  Better now that she is back on meds. Needs flu shot. Had mammo 1 year ago. She is s/p TAH and had pap last year. . . good for 3 years. Had colo last year as well.   Hypertension History:      She complains of peripheral edema, but denies headache, chest pain, palpitations, dyspnea with exertion, orthopnea, PND, and syncope.  She notes no problems with any antihypertensive medication side effects.        Positive major cardiovascular risk factors include female age 80 years old or older, hyperlipidemia, and hypertension.  Negative major cardiovascular risk factors include non-tobacco-user status.     Habits & Providers  Alcohol-Tobacco-Diet     Alcohol drinks/day: <1     Alcohol type: beer     Tobacco Status: never  Problems Prior to Update: 1)  Screening For Malignant Neoplasm, Cervix  (ICD-V76.2) 2)  Examination, Routine Medical  (ICD-V70.0) 3)  Screening For Mlig Neop, Breast, Nos  (ICD-V76.10) 4)  Hyperlipidemia  (ICD-272.4) 5)  Hypertension  (ICD-401.9) 6)  Shoulder Pain, Left  (ICD-719.41) 7)  Muscle Spasm, Trapezius Muscle, Left  (ICD-728.85)  Current Medications (verified): 1)  Benazepril-Hydrochlorothiazide 10-12.5 Mg  Tabs  (Benazepril-Hydrochlorothiazide) .Marland Kitchen.. 1 Tab By Mouth Daily 2)  Pravachol 40 Mg  Tabs (Pravastatin Sodium) .Marland Kitchen.. 1 Tab By Mouth Daily 3)  Nabumetone 750 Mg Tabs (Nabumetone) .Marland Kitchen.. 1 By Mouth Two Times A Day As Needed 4)  Aspirin Adult Low Strength 81 Mg Tbec (Aspirin) .Marland Kitchen.. 1 By Mouth Once Daily  Allergies (verified): No Known Drug Allergies  Past History:  Past Medical History: Last updated: 06/05/2008 Hypertension Hyperlipidemia  Past Surgical History: Last updated: 06/05/2008 s/p right knee arthroscopy 2008.Marland KitchenMarland KitchenDr.Duda. s/p TAH 1990(fibroids)  Physical Exam  General:  alert, well-developed, and well-nourished.   Head:  normocephalic and atraumatic.   Neck:  supple, no thyromegaly, and no carotid bruits.   Lungs:  normal breath sounds, no crackles, and no wheezes.   Heart:  normal rate, regular rhythm, and no murmur.   Abdomen:  normal bowel sounds.   Extremities:  edema Neurologic:  alert & oriented X3 and cranial nerves II-XII intact.   Psych:  normally interactive.     Impression & Recommendations:  Problem # 1:  EXAMINATION, ROUTINE MEDICAL (ICD-V70.0) flu shot today due for mammo soon  Future Orders: Mammogram (Screening) (Mammo) ... 06/09/2009  Problem # 2:  HYPERTENSION (ICD-401.9) controlled  Her updated medication list for this problem includes:    Benazepril-hydrochlorothiazide 10-12.5 Mg Tabs (Benazepril-hydrochlorothiazide) .Marland Kitchen... 1 tab by mouth daily  Problem #  3:  HYPERLIPIDEMIA (ICD-272.4) come back fasting to check labs  Her updated medication list for this problem includes:    Pravachol 40 Mg Tabs (Pravastatin sodium) .Marland Kitchen... 1 tab by mouth daily  Complete Medication List: 1)  Benazepril-hydrochlorothiazide 10-12.5 Mg Tabs (Benazepril-hydrochlorothiazide) .Marland Kitchen.. 1 tab by mouth daily 2)  Pravachol 40 Mg Tabs (Pravastatin sodium) .Marland Kitchen.. 1 tab by mouth daily 3)  Nabumetone 750 Mg Tabs (Nabumetone) .Marland Kitchen.. 1 by mouth two times a day as needed 4)  Aspirin Adult  Low Strength 81 Mg Tbec (Aspirin) .Marland Kitchen.. 1 by mouth once daily  Hypertension Assessment/Plan:      The patient's hypertensive risk group is category B: At least one risk factor (excluding diabetes) with no target organ damage.  Her calculated 10 year risk of coronary heart disease is 11 %.  Today's blood pressure is 119/80.  Her blood pressure goal is < 140/90.  Patient Instructions: 1)  Return in the next 2-3 weeks fasting for labs and to arrange mammogram.  (Labs:  CMET, Lipids)  Dx:  V70.0; 272.4; 401.1. 2)  Please schedule a follow-up appointment in 4 months with Syenna Nazir for CPE.    EKG  Procedure date:  05/30/2009  Findings:      NSR HR 77 Leftward axis ?LVH no isch changes
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
## 2010-05-26 ENCOUNTER — Encounter: Payer: Self-pay | Admitting: Nurse Practitioner

## 2010-05-26 ENCOUNTER — Encounter (INDEPENDENT_AMBULATORY_CARE_PROVIDER_SITE_OTHER): Payer: Self-pay | Admitting: Nurse Practitioner

## 2010-05-26 LAB — CONVERTED CEMR LAB
Bilirubin Urine: NEGATIVE
Blood in Urine, dipstick: NEGATIVE
Glucose, Urine, Semiquant: NEGATIVE
Ketones, urine, test strip: NEGATIVE
Microalb, Ur: 0.5 mg/dL (ref 0.00–1.89)
Nitrite: NEGATIVE
Protein, U semiquant: NEGATIVE
Specific Gravity, Urine: 1.015
Urobilinogen, UA: 0.2
WBC Urine, dipstick: NEGATIVE
pH: 5

## 2010-06-04 NOTE — Assessment & Plan Note (Signed)
Summary: HTN   Vital Signs:  Patient profile:   60 year old female Menstrual status:  hysterectomy Weight:      194.5 pounds Temp:     97.3 degrees F oral Pulse rate:   60 / minute Pulse rhythm:   regular Resp:     16 per minute BP sitting:   110 / 80  (left arm) Cuff size:   regular  Vitals Entered By: Levon Hedger (May 26, 2010 10:51 AM) CC: follow-up visit HTN...pinched nerve in shoulder having alot of pain , Hypertension Management, Lipid Management, Abdominal Pain Is Patient Diabetic? No Pain Assessment Patient in pain? yes     Location: shoulder  Does patient need assistance? Functional Status Self care Ambulation Normal     Menstrual Status hysterectomy Last PAP Result NEGATIVE FOR INTRAEPITHELIAL LESIONS OR MALIGNANCY.   Primary Care Provider:  Tereso Newcomer PA-C  CC:  follow-up visit HTN...pinched nerve in shoulder having alot of pain , Hypertension Management, Lipid Management, and Abdominal Pain.  History of Present Illness:  Pt into the office for f/u on blood pressure  Obesity - down 3 pounds since her last visit.  Does some stretching exercises  Dyspepsia History:      She has no alarm features of dyspepsia including no history of melena, hematochezia, dysphagia, persistent vomiting, or involuntary weight loss > 5%.  The patient does not have a prior history of documented ulcer disease.  The dominant symptom is not heartburn or acid reflux.  An H-2 blocker medication is currently being taken.  She notes that the symptoms have improved with the H-2 blocker therapy.  Symptoms have not persisted after 4 weeks of H-2 blocker treatment.    Hypertension History:      She denies headache, chest pain, and palpitations.  She notes no problems with any antihypertensive medication side effects.  pt is taking meds as ordered.        Positive major cardiovascular risk factors include female age 7 years old or older, hyperlipidemia, and hypertension.  Negative  major cardiovascular risk factors include no history of diabetes and non-tobacco-user status.    Lipid Management History:      Positive NCEP/ATP III risk factors include female age 35 years old or older and hypertension.  Negative NCEP/ATP III risk factors include non-diabetic and non-tobacco-user status.        The patient states that she does not know about the "Therapeutic Lifestyle Change" diet.  The patient does not know about adjunctive measures for cholesterol lowering.  She expresses no side effects from her lipid-lowering medication.  Comments include: pt is taking meds as ordered.  The patient denies any symptoms to suggest myopathy or liver disease.       Habits & Providers  Alcohol-Tobacco-Diet     Alcohol drinks/day: <1     Alcohol type: beer     Tobacco Status: never  Exercise-Depression-Behavior     Drug Use: no  Medications Prior to Update: 1)  Benazepril-Hydrochlorothiazide 10-12.5 Mg  Tabs (Benazepril-Hydrochlorothiazide) .Marland Kitchen.. 1 Tab By Mouth Daily 2)  Pravachol 40 Mg  Tabs (Pravastatin Sodium) .Marland Kitchen.. 1 Tab By Mouth Daily 3)  Nabumetone 750 Mg Tabs (Nabumetone) .Marland Kitchen.. 1 By Mouth Two Times A Day As Needed 4)  Aspirin Adult Low Strength 81 Mg Tbec (Aspirin) .Marland Kitchen.. 1 By Mouth Once Daily 5)  Pepcid 20 Mg Tabs (Famotidine) .... Take 1 Tablet By Mouth Two Times A Day As Needed For Indigestion  Current Medications (verified): 1)  Benazepril-Hydrochlorothiazide 10-12.5 Mg  Tabs (Benazepril-Hydrochlorothiazide) .Marland Kitchen.. 1 Tab By Mouth Daily 2)  Pravachol 40 Mg  Tabs (Pravastatin Sodium) .Marland Kitchen.. 1 Tab By Mouth Daily 3)  Nabumetone 750 Mg Tabs (Nabumetone) .Marland Kitchen.. 1 By Mouth Two Times A Day As Needed 4)  Aspirin Adult Low Strength 81 Mg Tbec (Aspirin) .Marland Kitchen.. 1 By Mouth Once Daily 5)  Pepcid 20 Mg Tabs (Famotidine) .... Take 1 Tablet By Mouth Two Times A Day As Needed For Indigestion  Allergies (verified): No Known Drug Allergies  Review of Systems CV:  Denies chest pain or  discomfort. Resp:  Denies cough. GI:  Denies abdominal pain, indigestion, nausea, and vomiting; indigestion improved - taking famotidine. MS:  Complains of joint pain and stiffness; bil shoulders with more achiness now than at other times. 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  Physical Exam  General:  alert.   Head:  normocephalic.   Eyes:  glasses Lungs:  normal breath sounds.   Heart:  normal rate and regular rhythm.   Msk:  up to the exam table - no assist Neurologic:  alert & oriented X3.   Skin:  color normal.   Psych:  Oriented X3.     Shoulder/Elbow Exam  Shoulder Exam:    Right:    Stability:  stable    Tenderness:  no    Swelling:  no    Erythema:  no    Left:    Stability:  stable    Tenderness:  no    Swelling:  no    Erythema:  no    active ROM in bilateral shoulders   Impression & Recommendations:  Problem # 1:  HYPERTENSION (ICD-401.9) Pt is taking meds as ordered DASH diet Her updated medication list for this problem includes:    Benazepril-hydrochlorothiazide 10-12.5 Mg Tabs (Benazepril-hydrochlorothiazide) .Marland Kitchen... 1 tab by mouth daily  Orders: T-Urine Microalbumin w/creat. ratio [phone])  Problem # 2:  HYPERLIPIDEMIA (ICD-272.4) reviewed low fat diet with pt continue current meds Her updated medication list for this problem includes:    Pravachol 40 Mg Tabs (Pravastatin sodium) .Marland Kitchen... 1 tab by mouth daily  Orders: Urine Microalbumin ([id number])  Problem # 3:  DYSPEPSIA (ICD-536.8) pt takes meds if needed  reviewed with pt the foods that make it worse  Problem # 4:  SHOULDER PAIN, LEFT (ICD-719.41) advised pt to do some ROM activities Her updated medication list for this problem includes:    Nabumetone 750 Mg Tabs (Nabumetone) .Marland Kitchen... 1 by mouth two times a day as needed    Aspirin Adult Low Strength 81 Mg Tbec (Aspirin) .Marland Kitchen... 1 by mouth once  daily  Complete Medication List: 1)  Benazepril-hydrochlorothiazide 10-12.5 Mg Tabs (Benazepril-hydrochlorothiazide) .Marland Kitchen.. 1 tab by mouth daily 2)  Pravachol 40 Mg Tabs (Pravastatin sodium) .Marland Kitchen.. 1 tab by mouth daily 3)  Nabumetone 750 Mg Tabs (Nabumetone) .Marland Kitchen.. 1 by mouth two times a day as needed 4)  Aspirin Adult Low Strength 81 Mg Tbec (Aspirin) .Marland Kitchen.. 1 by mouth once daily 5)  Pepcid 20 Mg Tabs (Famotidine) .... Take 1 tablet by mouth two times a day as needed for indigestion  Dyspepsia Assessment/Plan:  Step Therapy: GERD Treatment Protocols:    Step-1: improved  Hypertension Assessment/Plan:      The patient's hypertensive risk group is category B: At least one risk factor (excluding diabetes) with no target organ damage.  Her calculated 10  year risk of coronary heart disease is 9 %.  Today's blood pressure is 110/80.  Her blood pressure goal is < 140/90.  Lipid Assessment/Plan:      Based on NCEP/ATP III, the patient's risk factor category is "2 or more risk factors and a calculated 10 year CAD risk of < 20%".  The patient's lipid goals are as follows: Total cholesterol goal is 200; LDL cholesterol goal is 130; HDL cholesterol goal is 40; Triglyceride goal is 150.    Patient Instructions: 1)  Follow up 6 months for high blood pressure. 2)  Continue to take all your medications as ordered 3)  Come fasting for labs. 4)  You will need lipids, cmp, cbc,tsh.   Orders Added: 1)  T-Urine Microalbumin w/creat. ratio [[id number]] 2)  Est. Patient Level IV [16109] 3)  Urine Microalbumin [82044]    Laboratory Results   Urine Tests  Date/Time Received: May 26, 2010 11:05 AM   Routine Urinalysis   Color: lt. yellow Appearance: Clear Glucose: negative   (Normal Range: Negative) Bilirubin: negative   (Normal Range: Negative) Ketone: negative   (Normal Range: Negative) Spec. Gravity: 1.015   (Normal Range: 1.003-1.035) Blood: negative   (Normal Range: Negative) pH: 5.0    (Normal Range: 5.0-8.0) Protein: negative   (Normal Range: Negative) Urobilinogen: 0.2   (Normal Range: 0-1) Nitrite: negative   (Normal Range: Negative) Leukocyte Esterace: negative   (Normal Range: Negative)

## 2010-10-16 ENCOUNTER — Other Ambulatory Visit: Payer: Self-pay | Admitting: Physician Assistant

## 2010-10-19 ENCOUNTER — Other Ambulatory Visit (HOSPITAL_COMMUNITY): Payer: Self-pay | Admitting: Family Medicine

## 2010-10-19 DIAGNOSIS — Z1231 Encounter for screening mammogram for malignant neoplasm of breast: Secondary | ICD-10-CM

## 2010-10-29 ENCOUNTER — Ambulatory Visit (HOSPITAL_COMMUNITY)
Admission: RE | Admit: 2010-10-29 | Discharge: 2010-10-29 | Disposition: A | Discharge status: Home / Self Care | Payer: Medicare Other | Source: Ambulatory Visit | Attending: Family Medicine | Admitting: Family Medicine

## 2010-10-29 DIAGNOSIS — Z1231 Encounter for screening mammogram for malignant neoplasm of breast: Secondary | ICD-10-CM | POA: Insufficient documentation

## 2010-11-17 ENCOUNTER — Other Ambulatory Visit: Payer: Self-pay | Admitting: Physician Assistant

## 2010-11-18 ENCOUNTER — Other Ambulatory Visit: Payer: Self-pay | Admitting: Physician Assistant

## 2011-10-17 ENCOUNTER — Other Ambulatory Visit: Payer: Self-pay | Admitting: Physician Assistant

## 2011-11-22 ENCOUNTER — Encounter (HOSPITAL_COMMUNITY): Payer: Self-pay | Admitting: *Deleted

## 2011-11-22 ENCOUNTER — Emergency Department (INDEPENDENT_AMBULATORY_CARE_PROVIDER_SITE_OTHER)
Admission: EM | Admit: 2011-11-22 | Discharge: 2011-11-22 | Disposition: A | Discharge status: Home / Self Care | Payer: Medicare Other | Source: Home / Self Care | Attending: Emergency Medicine | Admitting: Emergency Medicine

## 2011-11-22 DIAGNOSIS — M109 Gout, unspecified: Secondary | ICD-10-CM

## 2011-11-22 HISTORY — DX: Essential (primary) hypertension: I10

## 2011-11-22 HISTORY — DX: Gastro-esophageal reflux disease without esophagitis: K21.9

## 2011-11-22 LAB — URIC ACID: Uric Acid, Serum: 5.3 mg/dL (ref 2.4–7.0)

## 2011-11-22 MED ORDER — COLCHICINE 0.6 MG PO TABS: ORAL_TABLET | ORAL | Status: DC

## 2011-11-22 MED ORDER — PREDNISONE 5 MG PO KIT: 1.0000 | PACK | Freq: Every day | ORAL | Status: DC

## 2011-11-22 NOTE — ED Provider Notes (Signed)
Chief Complaint  Patient presents with  . Toe Pain    History of Present Illness:   Sandy Morales is a 61 year old female who has had a three-day history of pain, swelling, redness, and heat over the MTP joint of the left great toe. This is rated as a 10 over 10 in intensity. It's painful to touch, to walk, to stand, or to move the toe. She denies any injury to the area. She's had no fever or chills. She denies any other joint pain. She's had no history of gout. She denies any medication or dietary changes. She states she's never had anything like this before.  Review of Systems:  Other than noted above, the patient denies any of the following symptoms: Systemic:  No fevers, chills, sweats, or aches.  No fatigue or tiredness. Musculoskeletal:  No joint pain, arthritis, bursitis, swelling, back pain, or neck pain. Neurological:  No muscular weakness, paresthesias, headache, or trouble with speech or coordination.  No dizziness.   PMFSH:  Past medical history, family history, social history, meds, and allergies were reviewed.  Physical Exam:   Vital signs:  BP 120/84  Pulse 70  Temp 98.7 F (37.1 C) (Oral)  Resp 22  SpO2 100% Gen:  Alert and oriented times 3.  In no distress. Musculoskeletal: There is pain, swelling, redness, and heat over the MTP joint of the left great toe. It was painful to move. Otherwise, all joints had a full a ROM with no swelling, bruising or deformity.  No edema, pulses full. Extremities were warm and pink.  Capillary refill was brisk.  Skin:  Clear, warm and dry.  No rash. Neuro:  Alert and oriented times 3.  Muscle strength was normal.  Sensation was intact to light touch.   Other Labs Obtained at Urgent Care Center:  A uric acid level was obtained.  Results are pending at this time and we will call about any positive results.  Assessment:  The encounter diagnosis was Gout.  Plan:   1.  The following meds were prescribed:   New Prescriptions   COLCHICINE 0.6 MG  TABLET    Take 2 now and 1 in 1 hour.  May repeat dose once daily.  For gout attack.   PREDNISONE 5 MG KIT    Take 1 kit (5 mg total) by mouth daily after breakfast. Prednisone 5 mg 6 day dosepack.  Take as directed.   2.  The patient was instructed in symptomatic care, including rest and activity, elevation, application of ice and compression.  Appropriate handouts were given. 3.  The patient was told to return if becoming worse in any way, if no better in 3 or 4 days, and given some red flag symptoms that would indicate earlier return.   4.  The patient was told to follow up with a primary care physician. [facility], so she has to find a new primary care Dr. She was given some primary care resources.   Reuben Likes, MD 11/22/11 [phone]

## 2011-11-22 NOTE — ED Notes (Signed)
Woke  Up  2  Days  Ago   With  painfull  l  Big  toe

## 2011-12-27 ENCOUNTER — Other Ambulatory Visit (HOSPITAL_COMMUNITY): Payer: Self-pay | Admitting: Internal Medicine

## 2011-12-27 DIAGNOSIS — Z1231 Encounter for screening mammogram for malignant neoplasm of breast: Secondary | ICD-10-CM

## 2012-01-04 ENCOUNTER — Ambulatory Visit (HOSPITAL_COMMUNITY)
Admission: RE | Admit: 2012-01-04 | Discharge: 2012-01-04 | Disposition: A | Discharge status: Home / Self Care | Payer: Medicare Other | Source: Ambulatory Visit | Attending: Internal Medicine | Admitting: Internal Medicine

## 2012-01-04 DIAGNOSIS — Z1231 Encounter for screening mammogram for malignant neoplasm of breast: Secondary | ICD-10-CM | POA: Insufficient documentation

## 2012-01-06 ENCOUNTER — Other Ambulatory Visit: Payer: Self-pay | Admitting: Internal Medicine

## 2012-01-06 DIAGNOSIS — R928 Other abnormal and inconclusive findings on diagnostic imaging of breast: Secondary | ICD-10-CM

## 2012-01-11 ENCOUNTER — Other Ambulatory Visit: Payer: Self-pay | Admitting: Internal Medicine

## 2012-01-11 ENCOUNTER — Ambulatory Visit
Admission: RE | Admit: 2012-01-11 | Discharge: 2012-01-11 | Disposition: A | Discharge status: Home / Self Care | Payer: Medicare Other | Source: Ambulatory Visit | Attending: Internal Medicine | Admitting: Internal Medicine

## 2012-01-11 DIAGNOSIS — R921 Mammographic calcification found on diagnostic imaging of breast: Secondary | ICD-10-CM

## 2012-01-11 DIAGNOSIS — R928 Other abnormal and inconclusive findings on diagnostic imaging of breast: Secondary | ICD-10-CM

## 2012-01-18 ENCOUNTER — Ambulatory Visit
Admission: RE | Admit: 2012-01-18 | Discharge: 2012-01-18 | Disposition: A | Discharge status: Home / Self Care | Payer: Medicare Other | Source: Ambulatory Visit | Attending: Internal Medicine | Admitting: Internal Medicine

## 2012-01-18 DIAGNOSIS — R921 Mammographic calcification found on diagnostic imaging of breast: Secondary | ICD-10-CM

## 2012-01-19 ENCOUNTER — Other Ambulatory Visit: Payer: Self-pay | Admitting: Internal Medicine

## 2012-01-19 DIAGNOSIS — C50912 Malignant neoplasm of unspecified site of left female breast: Secondary | ICD-10-CM

## 2012-01-20 ENCOUNTER — Telehealth: Payer: Self-pay | Admitting: *Deleted

## 2012-01-20 DIAGNOSIS — Z170 Estrogen receptor positive status [ER+]: Secondary | ICD-10-CM | POA: Insufficient documentation

## 2012-01-20 DIAGNOSIS — C50419 Malignant neoplasm of upper-outer quadrant of unspecified female breast: Secondary | ICD-10-CM

## 2012-01-20 DIAGNOSIS — C50412 Malignant neoplasm of upper-outer quadrant of left female breast: Secondary | ICD-10-CM | POA: Insufficient documentation

## 2012-01-20 NOTE — Telephone Encounter (Signed)
Confirmed [facility] for 01/26/12 at 0800 .  Instructions and contact information given.  

## 2012-01-25 ENCOUNTER — Ambulatory Visit
Admission: RE | Admit: 2012-01-25 | Discharge: 2012-01-25 | Disposition: A | Discharge status: Home / Self Care | Payer: Medicare Other | Source: Ambulatory Visit | Attending: Internal Medicine | Admitting: Internal Medicine

## 2012-01-25 DIAGNOSIS — C50912 Malignant neoplasm of unspecified site of left female breast: Secondary | ICD-10-CM

## 2012-01-25 MED ORDER — GADOBENATE DIMEGLUMINE 529 MG/ML IV SOLN
17.0000 mL | Freq: Once | INTRAVENOUS | Status: AC | PRN
  Administered 2012-01-25: 17 mL via INTRAVENOUS

## 2012-01-26 ENCOUNTER — Telehealth: Payer: Self-pay | Admitting: Oncology

## 2012-01-26 ENCOUNTER — Ambulatory Visit (HOSPITAL_BASED_OUTPATIENT_CLINIC_OR_DEPARTMENT_OTHER): Payer: Medicare Other | Admitting: Oncology

## 2012-01-26 ENCOUNTER — Ambulatory Visit: Payer: Medicare Other | Attending: Surgery | Admitting: Physical Therapy

## 2012-01-26 ENCOUNTER — Encounter: Payer: Self-pay | Admitting: *Deleted

## 2012-01-26 ENCOUNTER — Ambulatory Visit: Payer: Medicare Other

## 2012-01-26 ENCOUNTER — Encounter (INDEPENDENT_AMBULATORY_CARE_PROVIDER_SITE_OTHER): Payer: Self-pay | Admitting: Surgery

## 2012-01-26 ENCOUNTER — Encounter: Payer: Self-pay | Admitting: Oncology

## 2012-01-26 ENCOUNTER — Ambulatory Visit
Admission: RE | Admit: 2012-01-26 | Discharge: 2012-01-26 | Disposition: A | Discharge status: Home / Self Care | Payer: Medicare Other | Source: Ambulatory Visit | Attending: Radiation Oncology | Admitting: Radiation Oncology

## 2012-01-26 ENCOUNTER — Other Ambulatory Visit (HOSPITAL_BASED_OUTPATIENT_CLINIC_OR_DEPARTMENT_OTHER): Payer: Medicare Other | Admitting: Lab

## 2012-01-26 ENCOUNTER — Ambulatory Visit (HOSPITAL_BASED_OUTPATIENT_CLINIC_OR_DEPARTMENT_OTHER): Payer: Medicare Other | Admitting: Surgery

## 2012-01-26 VITALS — BP 138/84 | HR 75 | Temp 98.1°F | Resp 20 | Ht 60.0 in | Wt 201.4 lb

## 2012-01-26 DIAGNOSIS — D0590 Unspecified type of carcinoma in situ of unspecified breast: Secondary | ICD-10-CM

## 2012-01-26 DIAGNOSIS — C50919 Malignant neoplasm of unspecified site of unspecified female breast: Secondary | ICD-10-CM | POA: Insufficient documentation

## 2012-01-26 DIAGNOSIS — Z170 Estrogen receptor positive status [ER+]: Secondary | ICD-10-CM

## 2012-01-26 DIAGNOSIS — D0510 Intraductal carcinoma in situ of unspecified breast: Secondary | ICD-10-CM

## 2012-01-26 DIAGNOSIS — G4700 Insomnia, unspecified: Secondary | ICD-10-CM

## 2012-01-26 DIAGNOSIS — C50419 Malignant neoplasm of upper-outer quadrant of unspecified female breast: Secondary | ICD-10-CM

## 2012-01-26 DIAGNOSIS — I10 Essential (primary) hypertension: Secondary | ICD-10-CM

## 2012-01-26 DIAGNOSIS — M25619 Stiffness of unspecified shoulder, not elsewhere classified: Secondary | ICD-10-CM | POA: Insufficient documentation

## 2012-01-26 DIAGNOSIS — R293 Abnormal posture: Secondary | ICD-10-CM | POA: Insufficient documentation

## 2012-01-26 DIAGNOSIS — IMO0001 Reserved for inherently not codable concepts without codable children: Secondary | ICD-10-CM | POA: Insufficient documentation

## 2012-01-26 LAB — CBC WITH DIFFERENTIAL/PLATELET
BASO%: 0.4 % (ref 0.0–2.0)
Basophils Absolute: 0 10*3/uL (ref 0.0–0.1)
EOS%: 1.5 % (ref 0.0–7.0)
Eosinophils Absolute: 0.1 10*3/uL (ref 0.0–0.5)
HCT: 37.9 % (ref 34.8–46.6)
HGB: 12.6 g/dL (ref 11.6–15.9)
LYMPH%: 37 % (ref 14.0–49.7)
MCH: 31.7 pg (ref 25.1–34.0)
MCHC: 33.2 g/dL (ref 31.5–36.0)
MCV: 95.5 fL (ref 79.5–101.0)
MONO#: 0.4 10*3/uL (ref 0.1–0.9)
MONO%: 6.1 % (ref 0.0–14.0)
NEUT#: 3.7 10*3/uL (ref 1.5–6.5)
NEUT%: 55 % (ref 38.4–76.8)
Platelets: 240 10*3/uL (ref 145–400)
RBC: 3.97 10*6/uL (ref 3.70–5.45)
RDW: 12.5 % (ref 11.2–14.5)
WBC: 6.7 10*3/uL (ref 3.9–10.3)
lymph#: 2.5 10*3/uL (ref 0.9–3.3)

## 2012-01-26 LAB — COMPREHENSIVE METABOLIC PANEL (CC13)
ALT: 16 U/L (ref 0–55)
AST: 11 U/L (ref 5–34)
Albumin: 3.8 g/dL (ref 3.5–5.0)
Alkaline Phosphatase: 67 U/L (ref 40–150)
BUN: 20 mg/dL (ref 7.0–26.0)
CO2: 21 mEq/L — ABNORMAL LOW (ref 22–29)
Calcium: 9.7 mg/dL (ref 8.4–10.4)
Chloride: 108 mEq/L — ABNORMAL HIGH (ref 98–107)
Creatinine: 0.9 mg/dL (ref 0.6–1.1)
Glucose: 86 mg/dl (ref 70–99)
Potassium: 3.5 mEq/L (ref 3.5–5.1)
Sodium: 142 mEq/L (ref 136–145)
Total Bilirubin: 0.5 mg/dL (ref 0.20–1.20)
Total Protein: 7.4 g/dL (ref 6.4–8.3)

## 2012-01-26 NOTE — Progress Notes (Signed)
Checked new patient. No financial issues, patient has medicare and no secondary.

## 2012-01-26 NOTE — Telephone Encounter (Signed)
lmonvm adviisng the pt of her jan 2014 appts °

## 2012-01-26 NOTE — Progress Notes (Signed)
Sandy Morales was evaluated in multidisciplinary clinic today. 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 I discussed this with her and her son and let her know that should that change on her final pathology I would be happy to meet her back for formal consultation.

## 2012-01-26 NOTE — Progress Notes (Signed)
ID: Sandy Morales   DOB: Feb 11, 1951  MR#: [id number]  WUJ#:[id number]  PCP: Sandy Livings MD GYN:  SU: Sandy Morales OTHER MD: Dr. Yisroel Morales, Dr Sandy Morales  HISTORY OF PRESENT ILLNESS: Sandy Morales had routine screening mammography at [facility] hospital 01/04/2012 showing heterogeneously dense breasts. In addition, in Sandy left breast, new calcifications were noted. [facility] diagnostic [facility]ography at [facility] 01/11/2012 confirmed amorphous calcifications in Sandy left upper outer quadrant. Biopsy was performed 01/18/2012, and showed ([id number]) ductal carcinoma in situ, grade 3, strongly estrogen and progesterone receptor positive. Sandy Morales's subsequent history is as detailed below.  INTERVAL HISTORY: Sandy Morales was seen together with her son Sandy Morales at [facility] 01/26/2012  REVIEW OF SYSTEMS: She tolerated her biopsy without unusual complications. She complains of chronic insomnia, some shortness of breath when walking up stairs, heartburn, and problems with gout and arthritis. A detailed review of systems was otherwise noncontributory.  PAST MEDICAL HISTORY: Past Medical History  Diagnosis Date  . Hypertension   . GERD (gastroesophageal reflux disease)   . Breast cancer   . Dyslipidemia   . Osteoarthritis     PAST SURGICAL HISTORY: Past Surgical History  Procedure Date  . Knee surgery   . Abdominal hysterectomy     FAMILY HISTORY Family History  Problem Relation Age of Onset  . Lung cancer Mother   . Lung cancer Father    Sandy Morales's father died from lung cancer in Sandy setting of tobacco abuse. Sandy Morales's mother died possibly from cancer as well. Sandy Morales is not quite sure of Sandy age of death of either of her parents. Sandy Morales had 3 brothers and 7 sisters. She is not aware of any breast or ovarian cancer history in Sandy family.  GYNECOLOGIC HISTORY: First menstrual period age 57. First live birth age 35. She is GX  P3. She underwent TAH-BSO in 1995. She never used hormone replacement.  SOCIAL HISTORY: (updated OCT 2013) Sandy Morales worked for a [employer] for 32 years. 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 She has been widowed for approximately 11 years. She lives by herself. Her children are out Sandy Morales, who is currently incarcerated; Sandy Morales, who lives in [place] and works in [employer]; and Sandy Morales, who is disabled secondary to neuro fibromatosis. Sandy Morales has 16 grandchildren and 2 great-grandchildren. She attends a [employer], adult church.   ADVANCED DIRECTIVES: Not in place; Sandy Morales tells me she has talked with their sons Sandy Morales and Sandy Morales and that they would make decisions together if she became mantle incapacitated.  HEALTH MAINTENANCE: History  Substance Use Topics  . Smoking status: Never Smoker   . Smokeless tobacco: Not on file  . Alcohol Use: No     occasonal     Colonoscopy: 2011  PAP: s/p hyst  Bone density: never  Lipid panel:  No Known Allergies  Current Outpatient Prescriptions  Medication Sig Dispense Refill  . aspirin 81 MG tablet Take 81 mg by mouth daily.      . benazepril-hydrochlorthiazide (LOTENSIN HCT) 10-12.5 MG per tablet TAKE ONE TABLET BY MOUTH EVERY DAY  30 tablet  12  . colchicine 0.6 MG tablet Take 2 now and 1 in 1 hour.  May repeat dose once daily.  For gout attack.  12 tablet  2  . famotidine (PEPCID) 20 MG tablet Take 20 mg by mouth 2 (two) times daily.      Marland Kitchen  NABUMETONE PO Take 750 mg by mouth 2 (two) times daily.      . pravastatin (PRAVACHOL) 40 MG tablet TAKE ONE TABLET BY MOUTH EVERY DAY  30 tablet  12  . PredniSONE 5 MG KIT Take 1 kit (5 mg total) by mouth daily after breakfast. Prednisone 5 mg 6 day dosepack.  Take as directed.  1 kit  0    OBJECTIVE: Middle-aged [place] American woman who appears well Filed Vitals:   01/26/12 0900  BP: 138/84  Pulse: 75  Temp: 98.1 F (36.7 C)    Resp: 20     Body mass index is 39.33 kg/(m^2).    ECOG FS: 0  Sclerae unicteric Oropharynx clear No cervical or supraclavicular adenopathy Lungs no rales or rhonchi Heart regular rate and rhythm Abd benign MSK no focal spinal tenderness, no peripheral edema Neuro: nonfocal Breasts: Sandy right breast is unremarkable. Sandy left breast is status post recent biopsy. There is moderate ecchymosis. There is no nipple inversion or other skin involvement. Sandy left axilla is clear.  LAB RESULTS: Lab Results  Component Value Date   WBC 6.7 01/26/2012   NEUTROABS 3.7 01/26/2012   HGB 12.6 01/26/2012   HCT 37.9 01/26/2012   MCV 95.5 01/26/2012   PLT 240 01/26/2012      Chemistry      Component Value Date/Time   NA 142 01/26/2012 0836   NA 141 09/26/2009 2301   K 3.5 01/26/2012 0836   K 4.0 09/26/2009 2301   CL 108* 01/26/2012 0836   CL 104 09/26/2009 2301   CO2 21* 01/26/2012 0836   CO2 25 09/26/2009 2301   BUN 20.0 01/26/2012 0836   BUN 18 09/26/2009 2301   CREATININE 0.9 01/26/2012 0836   CREATININE 0.97 09/26/2009 2301      Component Value Date/Time   CALCIUM 9.7 01/26/2012 0836   CALCIUM 8.9 09/26/2009 2301   ALKPHOS 67 01/26/2012 0836   ALKPHOS 58 09/26/2009 2301   AST 11 01/26/2012 0836   AST 14 09/26/2009 2301   ALT 16 01/26/2012 0836   ALT 13 09/26/2009 2301   BILITOT 0.50 01/26/2012 0836   BILITOT 0.6 09/26/2009 2301       No results found for this basename: LABCA2    No components found with this basename: [id number]    No results found for this basename: INR:1;PROTIME:1 in Sandy last 168 hours  Urinalysis    Component Value Date/Time   COLORURINE lt. yellow 05/26/2010 1051   APPEARANCEUR Clear 05/26/2010 1051   LABSPEC 1.015 05/26/2010 1051   PHURINE 5.0 05/26/2010 1051   HGBUR negative 05/26/2010 1051   BILIRUBINUR negative 05/26/2010 1051   UROBILINOGEN 0.2 05/26/2010 1051   NITRITE negative 05/26/2010 1051    STUDIES: Mr Breast Bilateral W Wo Contrast  01/25/2012  *RADIOLOGY REPORT*   Clinical Data: Recently diagnosed left breast ductal carcinoma in situ.  BUN and creatinine were obtained on site at [facility] at [address] Results:  BUN 19 mg/dL,  Creatinine 0.8 mg/dL.  BILATERAL BREAST MRI WITH AND WITHOUT CONTRAST  Technique: Multiplanar, multisequence MR images of both breasts were obtained prior to and following Sandy intravenous administration of 17ml of MultiHance.  Three dimensional images were evaluated at Sandy independent DynaCad workstation.  Comparison:  Recent mammogram and biopsy examinations.  Findings: Moderate to marked background parenchymal enhancement in both breasts.  This is more confluent and more densely enhancing with irregular margins in Sandy upper inner and upper outer quadrants  of Sandy left breast.  This area has a mixture of enhancement kinetics, including rapid washin/washout.  This area measures 9.7 x 9.4 x 3.6 cm in maximum dimensions.  There is a post biopsy hematoma and biopsy marker clip artifact in Sandy lateral aspect of this area of asymmetrical enhancement.  A prominent intramammary lymph node is noted in Sandy posterior aspect of Sandy upper outer quadrant of Sandy left breast.  This has been mammographically stable since 2007.  Otherwise, no abnormal appearing lymph nodes are seen.  No additional masses or areas of enhancement suspicious for malignancy are seen in either breast.  IMPRESSION:  1.  Post biopsy changes in Sandy lateral aspect of a large area of asymmetrically dense and confluent, irregular enhancement in Sandy upper inner and upper outer quadrants of Sandy left breast, measuring 9.7 x 9.4 x 3.6 cm in maximum dimensions.  This has MR features suspicious for a large area of DCIS.  If breast conservation is desired, MR guided core needle biopsy of a portion of this area, remote from Sandy site of Sandy original biopsy, would be recommended for documentation of extent of disease. 2.  Mammographically stable prominent intramammary lymph node deep in  Sandy upper outer quadrant of Sandy left breast.  Sandy mammographic stability is compatible with a benign lymph node. 3.  No evidence of malignancy on Sandy right.  RECOMMENDATION: Left breast MR guided core needle biopsy if breast conservation is desired.  THREE-DIMENSIONAL MR IMAGE RENDERING ON INDEPENDENT WORKSTATION:  Three-dimensional MR images were rendered by post-processing of Sandy original MR data on an independent workstation.  Sandy three- dimensional MR images were interpreted, and findings were reported in Sandy accompanying complete MRI report for this study.  BI-RADS CATEGORY 4:  Suspicious abnormality - biopsy should be considered.   Original Report Authenticated By: Darrol Angel, M.D.    Mm Breast Stereo Biopsy Left  01/19/2012  *RADIOLOGY REPORT*  Clinical Data:  Microcalcifications in Sandy left upper outer quadrant  STEREOTACTIC-GUIDED VACUUM ASSISTED BIOPSY OF Sandy LEFT BREAST AND SPECIMEN RADIOGRAPH  Sandy Morales and I discussed Sandy procedure of stereotactic-guided biopsy, including benefits and alternatives.  We discussed Sandy high likelihood of a successful procedure. We discussed Sandy risks of Sandy procedure, including infection, bleeding, tissue injury, clip migration, and inadequate sampling.  Informed written consent was given.  Using sterile technique, 2% lidocaine, stereotactic guidance, and a 9 gauge vacuum assisted device, biopsy was performed of calcifications in Sandy left upper outer quadrant using a lateromedial approach.  Specimen radiograph was performed, showing calcifications within multiple core specimens.  Specimens with calcifications are identified for pathology.  At Sandy conclusion of Sandy procedure, a dumbbell shaped tissue marker clip was deployed into Sandy biopsy cavity.  Follow-up 2-view mammogram confirmed clip placement and removal of some of Sandy calcifications.  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   This is concordant with Sandy imaging findings.  Results were discussed with Sandy Morales by telephone at her request.  She reports no complications from Sandy procedure.  Breast MRI is scheduled for 01/25/2012 at 9:38.  Sandy Morales is scheduled to be seen in [facility] on 01/26/2012.  IMPRESSION: Stereotactic-guided biopsy of calcifications in Sandy left upper outer quadrant. 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  Breast MRI and [facility] scheduled.  No apparent complications.   Original Report Authenticated By: Lanora Manis  A. EAGLE, M.D.    Mm Breast Surgical Specimen  01/19/2012  *RADIOLOGY REPORT*  Clinical Data:  Microcalcifications in Sandy left upper outer quadrant  STEREOTACTIC-GUIDED VACUUM ASSISTED BIOPSY OF Sandy LEFT BREAST AND SPECIMEN RADIOGRAPH  Sandy Morales and I discussed Sandy procedure of stereotactic-guided biopsy, including benefits and alternatives.  We discussed Sandy high likelihood of a successful procedure. We discussed Sandy risks of Sandy procedure, including infection, bleeding, tissue injury, clip migration, and inadequate sampling.  Informed written consent was given.  Using sterile technique, 2% lidocaine, stereotactic guidance, and a 9 gauge vacuum assisted device, biopsy was performed of calcifications in Sandy left upper outer quadrant using a lateromedial approach.  Specimen radiograph was performed, showing calcifications within multiple core specimens.  Specimens with calcifications are identified for pathology.  At Sandy conclusion of Sandy procedure, a dumbbell shaped tissue marker clip was deployed into Sandy biopsy cavity.  Follow-up 2-view mammogram confirmed clip placement and removal of some of Sandy calcifications.  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  This is concordant with Sandy imaging findings.   Results were discussed with Sandy Morales by telephone at her request.  She reports no complications from Sandy procedure.  Breast MRI is scheduled for 01/25/2012 at 9:38.  Sandy Morales is scheduled to be seen in [facility] on 01/26/2012.  IMPRESSION: Stereotactic-guided biopsy of calcifications in Sandy left upper outer quadrant. 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  Breast MRI and [facility] scheduled.  No apparent complications.   Original Report Authenticated By: Daryl Eastern, M.D.    Mm Digital Diag Ltd L  01/11/2012  *RADIOLOGY REPORT*  Clinical Data:  Sandy Morales returns for evaluation of calcifications in Sandy outer portion of Sandy left breast noted on recent screening study dated 01/04/2012.  DIGITAL DIAGNOSTIC LEFT LIMITED MAMMOGRAM  Comparison:  10/29/2010, 09/29/2009, 06/07/2008, 12/23/2005  Findings:  Magnification views demonstrate fine amorphous calcifications in Sandy left upper outer quadrant.  Sandy appearance is slightly suspicious and biopsy is suggested.  IMPRESSION: Slightly suspicious calcifications in Sandy left upper outer quadrant.  RECOMMENDATION: Stereotactic core needle biopsy is suggested.  This has been scheduled for 01/18/2012 at 1:00 p.m.  BI-RADS CATEGORY 4:  Suspicious abnormality - biopsy should be considered.   Original Report Authenticated By: Daryl Eastern, M.D.    Mm Digital Screening  01/05/2012  *RADIOLOGY REPORT*  Clinical Data: Screening.  DIGITAL BILATERAL SCREENING MAMMOGRAM WITH CAD  Comparison:  Previous exams.  Findings: Two views of each breast demonstrate heterogeneously dense tissue .  In Sandy left breast, calcifications warrant further evaluation with magnified views.  In Sandy right breast, no mass or malignant type calcifications are identified.  Images were processed with CAD.  IMPRESSION: Further evaluation is suggested for calcifications in Sandy  left breast.  RECOMMENDATION: Diagnostic mammogram of Sandy left breast. (Code:FI-L-67M)  BI-RADS CATEGORY 0:  Incomplete.  Need additional imaging evaluation and/or prior mammograms for comparison.   Original Report Authenticated By: Patterson Hammersmith, M.D.     ASSESSMENT: 61 y.o. [place] woman status post left breast biopsy 01/18/2012 showing ductal carcinoma in situ, grade 3, strongly estrogen and progesterone receptor positive.  PLAN: Sandy extensive area of abnormality noted on MRI makes breast preservation unlikely. If Sandy Morales wanted to keep Sandy breast she would have to undergo additional biopsies. She prefers to have Sandy breast removed, with immediate  reconstruction, and this is very sensible as it will give her a 99% or so chance of cure assuming of course we are only dealing with noninvasive breast cancer.  Even if that is Sandy case, she may or may not wish to proceed to antiestrogen therapy, and she will return to see me a few weeks after her surgery to discuss Sandy possible side effects, toxicities and complications of antiestrogens as well as Sandy possible benefits. If she decides against further treatment, we will likely release her to Dr. Ginette Otto care at that time.  Sandy Morales understands that in any case she has an excellent prognosis. She knows to call for any problems that may develop before Sandy next visit.   Michaela Shankel C    01/26/2012

## 2012-01-26 NOTE — Progress Notes (Signed)
[facility] Psychosocial Distress Screening Clinical Social Work  Patient completed distress screening protocol, and scored a 2 on the Psychosocial Distress Thermometer which indicates mild distress. Clinical Social Worker met with patient and patient's son in [facility] to assess for distress and other psychosocial needs.  Pt stated she was doing "good" and was comfortable with treatment plan provided by physicians.  CSW informed pt of the patient and family support team and support services at [facility].  Pt did not express any needs at this time.  CSW encouraged pt to call with any questions or concerns.      Clinical Social Worker follow up needed: not at this time   Tamala Julian, MSW, LCSW Clinical Social Worker [facility] [phone]

## 2012-01-26 NOTE — Progress Notes (Signed)
Patient ID: Sandy Morales, female   DOB: 1951/02/15, 61 y.o.   MRN: [id number]  No chief complaint on file.   HPI Sandy Morales is a 60 y.o. female.  Pt sent at the request of Dr Azucena Kuba of [facility] due to large area of atypical microcalcifications left breast measuring 9.7 cm in maximal diameter.  Denies breast mass,  Breast pain, or  Nipple discharge bilaterally. No other complaints or breast history problems. HPI  Past Medical History  Diagnosis Date  . Hypertension   . GERD (gastroesophageal reflux disease)     Past Surgical History  Procedure Date  . Knee surgery   . Abdominal hysterectomy     No family history on file.  Social History History  Substance Use Topics  . Smoking status: Not on file  . Smokeless tobacco: Not on file  . Alcohol Use: Yes     occasonal    No Known Allergies  Current Outpatient Prescriptions  Medication Sig Dispense Refill  . aspirin 81 MG tablet Take 81 mg by mouth daily.      . benazepril-hydrochlorthiazide (LOTENSIN HCT) 10-12.5 MG per tablet TAKE ONE TABLET BY MOUTH EVERY DAY  30 tablet  12  . colchicine 0.6 MG tablet Take 2 now and 1 in 1 hour.  May repeat dose once daily.  For gout attack.  12 tablet  2  . famotidine (PEPCID) 20 MG tablet Take 20 mg by mouth 2 (two) times daily.      Marland Kitchen NABUMETONE PO Take 750 mg by mouth 2 (two) times daily.      . pravastatin (PRAVACHOL) 40 MG tablet TAKE ONE TABLET BY MOUTH EVERY DAY  30 tablet  12  . PredniSONE 5 MG KIT Take 1 kit (5 mg total) by mouth daily after breakfast. Prednisone 5 mg 6 day dosepack.  Take as directed.  1 kit  0   No current facility-administered medications for this visit.   Facility-Administered Medications Ordered in Other Visits  Medication Dose Route Frequency Provider Last Rate Last Dose  . gadobenate dimeglumine (MULTIHANCE) injection 17 mL  17 mL Intravenous Once PRN Medication Radiologist, MD   17 mL at 01/25/12 1050    Review of Systems Review of Systems    Constitutional: Negative for fever, chills and unexpected weight change.  HENT: Negative for hearing loss, congestion, sore throat, trouble swallowing and voice change.   Eyes: Negative for visual disturbance.  Respiratory: Negative for cough and wheezing.   Cardiovascular: Negative for chest pain, palpitations and leg swelling.  Gastrointestinal: Negative for nausea, vomiting, abdominal pain, diarrhea, constipation, blood in stool, abdominal distention and anal bleeding.  Genitourinary: Negative for hematuria, vaginal bleeding and difficulty urinating.  Musculoskeletal: Negative for arthralgias.  Skin: Negative for rash and wound.  Neurological: Negative for seizures, syncope and headaches.  Hematological: Negative for adenopathy. Does not bruise/bleed easily.  Psychiatric/Behavioral: Negative for confusion.    There were no vitals taken for this visit.  Physical Exam Physical Exam  Constitutional: She is oriented to person, place, and time. She appears well-developed and well-nourished.  HENT:  Head: Normocephalic and atraumatic.  Eyes: EOM are normal. Pupils are equal, round, and reactive to light.  Neck: Normal range of motion. Neck supple.  Cardiovascular: Normal rate and regular rhythm.   Pulmonary/Chest: Effort normal and breath sounds normal. Right breast exhibits no inverted nipple, no mass, no nipple discharge, no skin change and no tenderness. Left breast exhibits no inverted nipple, no mass, no nipple  discharge, no skin change and no tenderness. Breasts are symmetrical.    Abdominal: Soft.  Musculoskeletal: Normal range of motion.  Neurological: She is alert and oriented to person, place, and time.  Skin: Skin is warm and dry.  Psychiatric: She has a normal mood and affect. Her behavior is normal. Judgment and thought content normal.    Data Reviewed Clinical Data: Recently diagnosed left breast ductal carcinoma in  situ.  BUN and creatinine were obtained on site at  [facility] at  [address]  Results: BUN 19 mg/dL, Creatinine 0.8 mg/dL.  BILATERAL BREAST MRI WITH AND WITHOUT CONTRAST  Technique: Multiplanar, multisequence MR images of both breasts  were obtained prior to and following the intravenous administration  of 17ml of MultiHance. Three dimensional images were evaluated at  the independent DynaCad workstation.  Comparison: Recent mammogram and biopsy examinations.  Findings: Moderate to marked background parenchymal enhancement in  both breasts. This is more confluent and more densely enhancing  with irregular margins in the upper inner and upper outer quadrants  of the left breast. This area has a mixture of enhancement  kinetics, including rapid washin/washout. This area measures 9.7 x  9.4 x 3.6 cm in maximum dimensions. There is a post biopsy  hematoma and biopsy marker clip artifact in the lateral aspect of  this area of asymmetrical enhancement.  A prominent intramammary lymph node is noted in the posterior  aspect of the upper outer quadrant of the left breast. This has  been mammographically stable since 2007. Otherwise, no abnormal  appearing lymph nodes are seen.  No additional masses or areas of enhancement suspicious for  malignancy are seen in either breast.  IMPRESSION:  1. Post biopsy changes in the lateral aspect of a large area of  asymmetrically dense and confluent, irregular enhancement in the  upper inner and upper outer quadrants of the left breast, measuring  9.7 x 9.4 x 3.6 cm in maximum dimensions. This has MR features  suspicious for a large area of DCIS. If breast conservation is  desired, MR guided core needle biopsy of a portion of this area,  remote from the site of the original biopsy, would be recommended  for documentation of extent of disease.  2. Mammographically stable prominent intramammary lymph node deep  in the upper outer quadrant of the left breast. The mammographic  stability  is compatible with a benign lymph node.  3. No evidence of malignancy on the right.  RECOMMENDATION:  Left breast MR guided core needle biopsy if breast conservation is  desired. DCIS left breast pathology pathology Assessment      DCIS Left breast     Plan    Pt desires mastectomy after discussion of surgical options.  Area is quite large and breast size with this area would not be compatible with lumpectomy.  Pt desires reconstruction.  Schedule for left mastectomy and SLN mapping for DCIS.  The surgical and non surgical options have been discussed with the patient.  Risks of surgery include bleeding,  Infection,  Flap necrosis,  Tissue loss,  Chronic pain, death, Numbness,  And the need for additional procedures.  Reconstruction options also have been discussed with the patient as well.  The patient agrees to proceed.Sentinel lymph node mapping and dissection has been discussed with the patient.  Risk of bleeding,  Infection,  Seroma formation,  Additional procedures,,  Shoulder weakness ,  Shoulder stiffness,  Nerve and blood vessel injury and reaction to the  mapping dyes have been discussed.  Alternatives to surgery have been discussed with the patient.  The patient agrees to proceed.       Brydon Spahr A. 01/26/2012, 9:42 AM

## 2012-01-26 NOTE — Patient Instructions (Signed)
Total or Modified Radical Mastectomy   Care After   Refer to this sheet in the next few weeks. These instructions provide you with information on caring for yourself after your procedure. Your caregiver may also give you more specific instructions. Your treatment has been planned according to current medical practices, but problems sometimes occur. Call your caregiver if you have any problems or questions after your procedure.   ACTIVITY   Your caregiver will advise you when you may resume strenuous activities, driving, and sports.   After the drain(s) are removed, you may do light housework. Avoid heavy lifting, carrying, or pushing. You should not be lifting anything heavier than 5 lbs.   Take frequent rest periods. You may tire more easily than usual.   Always rest and elevate the arm affected by your surgery for a period of time equal to your activity time.   Continue doing the exercises given to you by the physical therapist/occupational therapist even after full range of motion has returned. The amount of time this takes will vary from person to person.   After normal range of motion has returned, some stiffness and soreness may persist for 2-3 months. This is normal and will subside.   Begin sports or strenuous activities in moderation. This will give you a chance to rebuild your endurance. Continue to be cautious of heavy lifting or carrying (no more than 10 lbs.) with your affected arm.   You may return to work as recommended by your caregiver.  NUTRITION   You may resume your normal diet.   Make sure you drink plenty of fluids (6-8 glasses a day).   Eat a well-balanced diet. Including daily portions of food from government recommended food groups:   Grains.   Vegetables.   Fruits.   Milk.   Meat & beans.   Oils.  Visit http://mypyramid.gov/ for more information   HYGIENE   You may wash your hair.   If your incision (cut from surgery) is closed, you may shower or tub bathe, unless instructed otherwise by  your doctor.  FEVER   If you feel feverish or have shaking chills, take your temperature. If your temperature is 102° F (38.9° C) or above, call your caregiver. The fever may mean there is an infection.   If you call early, infection can be treated with antibiotics and hospitalization may be avoided.  PAIN CONTROL   Mild discomfort may occur.   You may need to take an over-the-counter pain medication or a medication prescribed by your caregiver.   Call your caregiver if you experience increased pain.  INCISION CARE   Check your incision daily for increased redness, drainage, swelling, or separation of skin.   Call your caregiver if any of the above are noted.  ARM AND HAND CARE   If the lymph nodes under your arm were removed with a modified radical mastectomy, there may be a greater tendency for the arm to swell.   Try to avoid having blood pressures taken, blood drawn, or injections given in the affected arm. This is the arm on the same side as the surgery.   Use hand lotion to soften cuticles instead of cutting them to avoid cutting yourself.   Be careful when shaving your under arms. Use an electric shaver if possible. You may use a deodorant after the incision has completely healed. Until then, clean under your arms with hydrogen peroxide.   Use reasonable precaution when cooking, sewing, and gardening to avoid   burning or needle or thorn pricks.   Do not weigh your arm straight down with a package or your purse.   Follow the exercises and instructions given to you by the physical therapist/occupational therapist and your caregiver.  FOLLOW-UP APPOINTMENT   Call your caregiver for a follow-up appointment as directed.   PROSTHESIS INFORMATION   Wear your temporary prosthesis (artificial breast) until your caregiver gives you permission to purchase a permanent one. This will depend upon your rate of healing. We suggest you also wait until you are physically and emotionally ready to shop for one. The suitability  depends on several individual factors. We do not endorse any particular prosthesis, but suggest you try several until you are satisfied with appearance and fit. A list of stores may be obtained from your local [employer] at www.cancer.org or 1-800-ACS-2345 (1-800-227-2345).   A permanent prosthesis is medically necessary to restore balance. It is also income tax deductible. Be sure all receipts are marked "surgical". It is not essential to purchase a bra. You may sew a pocket into your regular bra.   Note: Remember to take all of your medical insurance information with you when shopping for your prosthesis.   SELECTING A PROSTHESIS FITTER   You may want to ask the following questions when selecting a fitter:   What styles and brands of forms are carried in stock?   How long have the forms been on the market and have there been any problems with them?   Why would one form be better than another?   How long should a particular form last?   May I wear the form for a trial period without obligation?   Do the forms require a prosthetic bra? If so, what is the price range? Must I always wear that style?   If alterations to the bra are necessary, can they be done at this location or be sent out?   Will I be charged for alterations?   Will I receive suggestions on how to alter my own wardrobe, if necessary?   Will you special order forms or bras if necessary?   Are fitters always available to meet my needs?   What kinds of garments should be worn for the fitting?   Are lounge wear, swim wear, and accessories available?   If I have insurance coverage or Medicare, will you suggest ways for processing the paper work?   Do you keep complete records so that mail reordering is possible?   How are warranty claims handled if I have a problem with the form?  Document Released: 11/27/2003 Document Revised: 06/28/2011 Document Reviewed: 08/01/2007   [employer]® Patient Information ©2013 [employer].

## 2012-02-08 ENCOUNTER — Telehealth: Payer: Self-pay | Admitting: *Deleted

## 2012-02-08 NOTE — Telephone Encounter (Signed)
Spoke to pt concerning [facility] from 01/26/12.  Pt denies questions regarding dx or treatment care plan.  Pt relate she had a "good" appt with Dr. Odis Luster concerning reconstruction yesterday.  Informed pt that Dr. Luisa Hart and Dr. Odis Luster will coordinate their schedules and surgery will get planned.  Pt relate "I'm ready".  Encourage pt to call with further needs or concerns.  Received verbal understandings.  Confirm f/u appt with Dr. Darnelle Catalan on 05/02/12.  Contact information given.

## 2012-02-21 ENCOUNTER — Encounter: Payer: Self-pay | Admitting: *Deleted

## 2012-02-21 NOTE — Progress Notes (Signed)
Mailed after appt letter to pt. 

## 2012-02-29 ENCOUNTER — Telehealth: Payer: Self-pay | Admitting: *Deleted

## 2012-02-29 ENCOUNTER — Encounter: Payer: Self-pay | Admitting: *Deleted

## 2012-02-29 NOTE — Telephone Encounter (Signed)
Left vm for pt to return call to r/s f/u appt with Dr. Darnelle Catalan d/t surgery date being scheduled on 04/27/12

## 2012-02-29 NOTE — Telephone Encounter (Signed)
Patient confirmed over the phone the new date and time on 05-18-2012 at 12:00pm

## 2012-04-14 ENCOUNTER — Encounter (HOSPITAL_COMMUNITY): Payer: Self-pay | Admitting: Respiratory Therapy

## 2012-04-19 DIAGNOSIS — C50919 Malignant neoplasm of unspecified site of unspecified female breast: Secondary | ICD-10-CM

## 2012-04-19 HISTORY — DX: Malignant neoplasm of unspecified site of unspecified female breast: C50.919

## 2012-04-20 NOTE — Pre-Procedure Instructions (Addendum)
20 KASEE HANTZ  04/20/2012   Your procedure is scheduled on:  04/27/2012  Report to [facility] at 6:30 AM.  Call this number if you have problems the morning of surgery: [phone]   Remember:   Do not eat foodor drink:After Midnight.     Take these medicines the morning of surgery with A SIP OF WATER: PEPCID, colchicine  STOP aspirin , nabumetone on 04/22/12   Do not wear jewelry, make-up or nail polish.  Do not wear lotions, powders, or perfumes. You may wear deodorant.  Do not shave 48 hours prior to surgery. Men may shave face and neck.  Do not bring valuables to the hospital.  Contacts, dentures or bridgework may not be worn into surgery.  Leave suitcase in the car. After surgery it may be brought to your room.  For patients admitted to the hospital, checkout time is 11:00 AM the day of discharge.   Patients discharged the day of surgery will not be allowed to drive home.  Name and phone number of your driver: /w family timothy son [phone]  Special Instructions: Shower using CHG 2 nights before surgery and the night before surgery.  If you shower the day of surgery use CHG.  Use special wash - you have one bottle of CHG for all showers.  You should use approximately 1/3 of the bottle for each shower.   Please read over the following fact sheets that you were given: Pain Booklet, Coughing and Deep Breathing, MRSA Information and Surgical Site Infection Prevention

## 2012-04-21 ENCOUNTER — Encounter (HOSPITAL_COMMUNITY)
Admission: RE | Admit: 2012-04-21 | Discharge: 2012-04-21 | Disposition: A | Discharge status: Home / Self Care | Payer: Medicare Other | Source: Ambulatory Visit | Attending: Surgery | Admitting: Surgery

## 2012-04-21 ENCOUNTER — Encounter (HOSPITAL_COMMUNITY): Payer: Self-pay

## 2012-04-21 VITALS — BP 120/76 | HR 102 | Temp 98.6°F | Resp 20 | Ht 60.0 in | Wt 198.0 lb

## 2012-04-21 DIAGNOSIS — D0510 Intraductal carcinoma in situ of unspecified breast: Secondary | ICD-10-CM

## 2012-04-21 HISTORY — DX: Gout, unspecified: M10.9

## 2012-04-21 LAB — CBC WITH DIFFERENTIAL/PLATELET
Basophils Absolute: 0 10*3/uL (ref 0.0–0.1)
Basophils Relative: 0 % (ref 0–1)
Eosinophils Absolute: 0.2 10*3/uL (ref 0.0–0.7)
Eosinophils Relative: 2 % (ref 0–5)
HCT: 35.7 % — ABNORMAL LOW (ref 36.0–46.0)
Hemoglobin: 12.1 g/dL (ref 12.0–15.0)
Lymphocytes Relative: 31 % (ref 12–46)
Lymphs Abs: 2.2 10*3/uL (ref 0.7–4.0)
MCH: 31.6 pg (ref 26.0–34.0)
MCHC: 33.9 g/dL (ref 30.0–36.0)
MCV: 93.2 fL (ref 78.0–100.0)
Monocytes Absolute: 0.4 10*3/uL (ref 0.1–1.0)
Monocytes Relative: 6 % (ref 3–12)
Neutro Abs: 4.1 10*3/uL (ref 1.7–7.7)
Neutrophils Relative %: 60 % (ref 43–77)
Platelets: 242 10*3/uL (ref 150–400)
RBC: 3.83 MIL/uL — ABNORMAL LOW (ref 3.87–5.11)
RDW: 11.7 % (ref 11.5–15.5)
WBC: 6.8 10*3/uL (ref 4.0–10.5)

## 2012-04-21 LAB — COMPREHENSIVE METABOLIC PANEL
ALT: 15 U/L (ref 0–35)
AST: 19 U/L (ref 0–37)
Albumin: 4 g/dL (ref 3.5–5.2)
Alkaline Phosphatase: 82 U/L (ref 39–117)
BUN: 29 mg/dL — ABNORMAL HIGH (ref 6–23)
CO2: 24 mEq/L (ref 19–32)
Calcium: 10.1 mg/dL (ref 8.4–10.5)
Chloride: 98 mEq/L (ref 96–112)
Creatinine, Ser: 1.06 mg/dL (ref 0.50–1.10)
GFR calc Af Amer: 64 mL/min — ABNORMAL LOW (ref >90)
GFR calc non Af Amer: 55 mL/min — ABNORMAL LOW (ref >90)
Glucose, Bld: 136 mg/dL — ABNORMAL HIGH (ref 70–99)
Potassium: 3.3 mEq/L — ABNORMAL LOW (ref 3.5–5.1)
Sodium: 138 mEq/L (ref 135–145)
Total Bilirubin: 0.5 mg/dL (ref 0.3–1.2)
Total Protein: 8.6 g/dL — ABNORMAL HIGH (ref 6.0–8.3)

## 2012-04-21 LAB — SURGICAL PCR SCREEN
MRSA, PCR: NEGATIVE
Staphylococcus aureus: NEGATIVE

## 2012-04-27 ENCOUNTER — Encounter (HOSPITAL_COMMUNITY): Payer: Self-pay | Admitting: Anesthesiology

## 2012-04-27 ENCOUNTER — Inpatient Hospital Stay (HOSPITAL_COMMUNITY)
Admission: RE | Admit: 2012-04-27 | Discharge: 2012-04-28 | DRG: 581 | Disposition: A | Discharge status: Home / Self Care | Payer: Medicare Other | Source: Ambulatory Visit | Attending: Surgery | Admitting: Surgery

## 2012-04-27 ENCOUNTER — Encounter (HOSPITAL_COMMUNITY): Payer: Self-pay | Admitting: *Deleted

## 2012-04-27 ENCOUNTER — Encounter (HOSPITAL_COMMUNITY)
Admission: RE | Disposition: A | Discharge status: Home / Self Care | Payer: Self-pay | Source: Ambulatory Visit | Attending: Surgery

## 2012-04-27 ENCOUNTER — Encounter (HOSPITAL_COMMUNITY)
Admission: RE | Admit: 2012-04-27 | Discharge: 2012-04-27 | Disposition: A | Discharge status: Home / Self Care | Payer: Medicare Other | Source: Ambulatory Visit | Attending: Surgery | Admitting: Surgery

## 2012-04-27 ENCOUNTER — Encounter (HOSPITAL_COMMUNITY): Payer: Self-pay | Admitting: General Practice

## 2012-04-27 ENCOUNTER — Ambulatory Visit (HOSPITAL_COMMUNITY): Payer: Medicare Other | Admitting: Anesthesiology

## 2012-04-27 DIAGNOSIS — R1013 Epigastric pain: Secondary | ICD-10-CM

## 2012-04-27 DIAGNOSIS — Z79899 Other long term (current) drug therapy: Secondary | ICD-10-CM

## 2012-04-27 DIAGNOSIS — Z01810 Encounter for preprocedural cardiovascular examination: Secondary | ICD-10-CM

## 2012-04-27 DIAGNOSIS — I10 Essential (primary) hypertension: Secondary | ICD-10-CM | POA: Diagnosis present

## 2012-04-27 DIAGNOSIS — Z01818 Encounter for other preprocedural examination: Secondary | ICD-10-CM

## 2012-04-27 DIAGNOSIS — D0590 Unspecified type of carcinoma in situ of unspecified breast: Secondary | ICD-10-CM | POA: Insufficient documentation

## 2012-04-27 DIAGNOSIS — M62838 Other muscle spasm: Secondary | ICD-10-CM

## 2012-04-27 DIAGNOSIS — E785 Hyperlipidemia, unspecified: Secondary | ICD-10-CM | POA: Diagnosis present

## 2012-04-27 DIAGNOSIS — Z7982 Long term (current) use of aspirin: Secondary | ICD-10-CM

## 2012-04-27 DIAGNOSIS — K3189 Other diseases of stomach and duodenum: Secondary | ICD-10-CM | POA: Diagnosis present

## 2012-04-27 DIAGNOSIS — M25519 Pain in unspecified shoulder: Secondary | ICD-10-CM

## 2012-04-27 DIAGNOSIS — C50419 Malignant neoplasm of upper-outer quadrant of unspecified female breast: Secondary | ICD-10-CM

## 2012-04-27 DIAGNOSIS — Z01812 Encounter for preprocedural laboratory examination: Secondary | ICD-10-CM

## 2012-04-27 DIAGNOSIS — K219 Gastro-esophageal reflux disease without esophagitis: Secondary | ICD-10-CM | POA: Diagnosis present

## 2012-04-27 DIAGNOSIS — D0510 Intraductal carcinoma in situ of unspecified breast: Secondary | ICD-10-CM

## 2012-04-27 DIAGNOSIS — IMO0002 Reserved for concepts with insufficient information to code with codable children: Secondary | ICD-10-CM

## 2012-04-27 HISTORY — PX: MASTECTOMY W/ SENTINEL NODE BIOPSY: SHX2001

## 2012-04-27 HISTORY — PX: TISSUE EXPANDER PLACEMENT: SHX2530

## 2012-04-27 HISTORY — PX: MASTECTOMY: SHX3

## 2012-04-27 LAB — CREATININE, SERUM
Creatinine, Ser: 0.7 mg/dL (ref 0.50–1.10)
GFR calc Af Amer: >90 mL/min (ref >90)
GFR calc non Af Amer: >90 mL/min (ref >90)

## 2012-04-27 LAB — CBC WITH DIFFERENTIAL/PLATELET
Basophils Absolute: 0 10*3/uL (ref 0.0–0.1)
Basophils Relative: 0 % (ref 0–1)
Eosinophils Absolute: 0 10*3/uL (ref 0.0–0.7)
Eosinophils Relative: 0 % (ref 0–5)
HCT: 31.4 % — ABNORMAL LOW (ref 36.0–46.0)
Hemoglobin: 10.8 g/dL — ABNORMAL LOW (ref 12.0–15.0)
Lymphocytes Relative: 15 % (ref 12–46)
Lymphs Abs: 1.6 10*3/uL (ref 0.7–4.0)
MCH: 31.9 pg (ref 26.0–34.0)
MCHC: 34.4 g/dL (ref 30.0–36.0)
MCV: 92.6 fL (ref 78.0–100.0)
Monocytes Absolute: 0.5 10*3/uL (ref 0.1–1.0)
Monocytes Relative: 5 % (ref 3–12)
Neutro Abs: 8.5 10*3/uL — ABNORMAL HIGH (ref 1.7–7.7)
Neutrophils Relative %: 80 % — ABNORMAL HIGH (ref 43–77)
Platelets: 250 10*3/uL (ref 150–400)
RBC: 3.39 MIL/uL — ABNORMAL LOW (ref 3.87–5.11)
RDW: 11.7 % (ref 11.5–15.5)
WBC: 10.7 10*3/uL — ABNORMAL HIGH (ref 4.0–10.5)

## 2012-04-27 SURGERY — MASTECTOMY WITH SENTINEL LYMPH NODE BIOPSY
Anesthesia: General | Site: Breast | Laterality: Left | Wound class: Clean

## 2012-04-27 MED ORDER — CEFAZOLIN SODIUM 1-5 GM-% IV SOLN
1.0000 g | Freq: Three times a day (TID) | INTRAVENOUS | Status: DC
  Administered 2012-04-27: 1 g via INTRAVENOUS
  Filled 2012-04-27 (×4): qty 50

## 2012-04-27 MED ORDER — INFLUENZA VIRUS VACC SPLIT PF IM SUSP
0.5000 mL | INTRAMUSCULAR | Status: AC
  Administered 2012-04-28: 0.5 mL via INTRAMUSCULAR
  Filled 2012-04-27: qty 0.5

## 2012-04-27 MED ORDER — ENOXAPARIN SODIUM 40 MG/0.4ML ~~LOC~~ SOLN
40.0000 mg | SUBCUTANEOUS | Status: DC
  Administered 2012-04-28: 40 mg via SUBCUTANEOUS
  Filled 2012-04-27 (×2): qty 0.4

## 2012-04-27 MED ORDER — FENTANYL CITRATE 0.05 MG/ML IJ SOLN
INTRAMUSCULAR | Status: DC | PRN
  Administered 2012-04-27 (×2): 50 ug via INTRAVENOUS
  Administered 2012-04-27: 100 ug via INTRAVENOUS

## 2012-04-27 MED ORDER — LIDOCAINE HCL (CARDIAC) 20 MG/ML IV SOLN
INTRAVENOUS | Status: DC | PRN
  Administered 2012-04-27: 100 mg via INTRAVENOUS

## 2012-04-27 MED ORDER — METHYLENE BLUE 1 % INJ SOLN
INTRAMUSCULAR | Status: AC
  Filled 2012-04-27: qty 10

## 2012-04-27 MED ORDER — COLCHICINE 0.6 MG PO TABS
0.6000 mg | ORAL_TABLET | Freq: Every day | ORAL | Status: DC
  Administered 2012-04-28: 0.6 mg via ORAL
  Filled 2012-04-27: qty 1

## 2012-04-27 MED ORDER — TECHNETIUM TC 99M SULFUR COLLOID FILTERED
1.0000 | Freq: Once | INTRAVENOUS | Status: AC | PRN
  Administered 2012-04-27: 1 via INTRADERMAL

## 2012-04-27 MED ORDER — NEOSTIGMINE METHYLSULFATE 1 MG/ML IJ SOLN
INTRAMUSCULAR | Status: DC | PRN
  Administered 2012-04-27: 4 mg via INTRAVENOUS

## 2012-04-27 MED ORDER — BENAZEPRIL HCL 10 MG PO TABS
10.0000 mg | ORAL_TABLET | Freq: Every day | ORAL | Status: DC
  Administered 2012-04-28: 10 mg via ORAL
  Filled 2012-04-27 (×2): qty 1

## 2012-04-27 MED ORDER — HYDROCHLOROTHIAZIDE 12.5 MG PO CAPS
12.5000 mg | ORAL_CAPSULE | Freq: Every day | ORAL | Status: DC
  Administered 2012-04-28: 12.5 mg via ORAL
  Filled 2012-04-27: qty 1

## 2012-04-27 MED ORDER — ALBUMIN HUMAN 5 % IV SOLN
INTRAVENOUS | Status: DC | PRN
  Administered 2012-04-27: 12:00:00 via INTRAVENOUS

## 2012-04-27 MED ORDER — 0.9 % SODIUM CHLORIDE (POUR BTL) OPTIME
TOPICAL | Status: DC | PRN
  Administered 2012-04-27 (×2): 1000 mL

## 2012-04-27 MED ORDER — BENAZEPRIL-HYDROCHLOROTHIAZIDE 10-12.5 MG PO TABS: 1.0000 | ORAL_TABLET | Freq: Every day | ORAL | Status: DC

## 2012-04-27 MED ORDER — LACTATED RINGERS IV SOLN
INTRAVENOUS | Status: DC | PRN
  Administered 2012-04-27 (×3): via INTRAVENOUS

## 2012-04-27 MED ORDER — ONDANSETRON HCL 4 MG/2ML IJ SOLN
INTRAMUSCULAR | Status: DC | PRN
  Administered 2012-04-27 (×2): 4 mg via INTRAVENOUS

## 2012-04-27 MED ORDER — GLYCOPYRROLATE 0.2 MG/ML IJ SOLN
INTRAMUSCULAR | Status: DC | PRN
  Administered 2012-04-27: 0.6 mg via INTRAVENOUS
  Administered 2012-04-27: 0.2 mg via INTRAVENOUS

## 2012-04-27 MED ORDER — ARTIFICIAL TEARS OP OINT
TOPICAL_OINTMENT | OPHTHALMIC | Status: DC | PRN
  Administered 2012-04-27: 1 via OPHTHALMIC

## 2012-04-27 MED ORDER — CEFAZOLIN SODIUM-DEXTROSE 2-3 GM-% IV SOLR
2.0000 g | Freq: Once | INTRAVENOUS | Status: AC
  Administered 2012-04-27: 2 g via INTRAVENOUS

## 2012-04-27 MED ORDER — SODIUM CHLORIDE 0.9 % IR SOLN
Status: DC | PRN
  Administered 2012-04-27: 12:00:00

## 2012-04-27 MED ORDER — ROCURONIUM BROMIDE 100 MG/10ML IV SOLN
INTRAVENOUS | Status: DC | PRN
  Administered 2012-04-27: 50 mg via INTRAVENOUS
  Administered 2012-04-27: 10 mg via INTRAVENOUS
  Administered 2012-04-27: 20 mg via INTRAVENOUS

## 2012-04-27 MED ORDER — OXYCODONE-ACETAMINOPHEN 5-325 MG PO TABS
1.0000 | ORAL_TABLET | ORAL | Status: DC | PRN
  Administered 2012-04-28: 2 via ORAL
  Filled 2012-04-27: qty 2

## 2012-04-27 MED ORDER — OXYCODONE HCL 5 MG PO TABS: 5.0000 mg | ORAL_TABLET | Freq: Once | ORAL | Status: DC | PRN

## 2012-04-27 MED ORDER — LACTATED RINGERS IV SOLN
INTRAVENOUS | Status: DC
  Administered 2012-04-27: 50 mL/h via INTRAVENOUS

## 2012-04-27 MED ORDER — ONDANSETRON HCL 4 MG/2ML IJ SOLN: 4.0000 mg | Freq: Four times a day (QID) | INTRAMUSCULAR | Status: DC | PRN

## 2012-04-27 MED ORDER — MORPHINE SULFATE 2 MG/ML IJ SOLN: 2.0000 mg | INTRAMUSCULAR | Status: DC | PRN

## 2012-04-27 MED ORDER — NABUMETONE 750 MG PO TABS
750.0000 mg | ORAL_TABLET | Freq: Two times a day (BID) | ORAL | Status: DC
Start: 2012-04-28 — End: 2012-04-28
  Administered 2012-04-28: 750 mg via ORAL
  Filled 2012-04-27 (×3): qty 1

## 2012-04-27 MED ORDER — PROPOFOL 10 MG/ML IV BOLUS
INTRAVENOUS | Status: DC | PRN
  Administered 2012-04-27: 200 mg via INTRAVENOUS

## 2012-04-27 MED ORDER — HYDROMORPHONE HCL PF 1 MG/ML IJ SOLN
INTRAMUSCULAR | Status: AC
  Filled 2012-04-27: qty 1

## 2012-04-27 MED ORDER — HYDROMORPHONE HCL PF 1 MG/ML IJ SOLN
0.2500 mg | INTRAMUSCULAR | Status: DC | PRN
  Administered 2012-04-27 (×2): 0.5 mg via INTRAVENOUS

## 2012-04-27 MED ORDER — FENTANYL CITRATE 0.05 MG/ML IJ SOLN
100.0000 ug | Freq: Once | INTRAMUSCULAR | Status: AC
  Administered 2012-04-27: 100 ug via INTRAVENOUS

## 2012-04-27 MED ORDER — MIDAZOLAM HCL 5 MG/5ML IJ SOLN
INTRAMUSCULAR | Status: DC | PRN
  Administered 2012-04-27: 2 mg via INTRAVENOUS

## 2012-04-27 MED ORDER — HYDROCODONE-ACETAMINOPHEN 7.5-325 MG PO TABS
2.0000 | ORAL_TABLET | Freq: Four times a day (QID) | ORAL | Status: DC | PRN
  Administered 2012-04-27 – 2012-04-28 (×2): 2 via ORAL
  Filled 2012-04-27 (×2): qty 2

## 2012-04-27 MED ORDER — SODIUM CHLORIDE 0.9 % IR SOLN
Status: DC | PRN
  Administered 2012-04-27: 1

## 2012-04-27 MED ORDER — OXYCODONE HCL 5 MG/5ML PO SOLN: 5.0000 mg | Freq: Once | ORAL | Status: DC | PRN

## 2012-04-27 MED ORDER — METHOCARBAMOL 500 MG PO TABS
500.0000 mg | ORAL_TABLET | Freq: Four times a day (QID) | ORAL | Status: DC
  Administered 2012-04-27 – 2012-04-28 (×2): 500 mg via ORAL
  Filled 2012-04-27 (×6): qty 1

## 2012-04-27 MED ORDER — ONDANSETRON HCL 4 MG PO TABS: 4.0000 mg | ORAL_TABLET | Freq: Four times a day (QID) | ORAL | Status: DC | PRN

## 2012-04-27 MED ORDER — FENTANYL CITRATE 0.05 MG/ML IJ SOLN
INTRAMUSCULAR | Status: AC
  Filled 2012-04-27: qty 2

## 2012-04-27 MED ORDER — KCL IN DEXTROSE-NACL 20-5-0.45 MEQ/L-%-% IV SOLN
INTRAVENOUS | Status: DC
  Administered 2012-04-28: 05:00:00 via INTRAVENOUS
  Filled 2012-04-27 (×4): qty 1000

## 2012-04-27 MED ORDER — SIMVASTATIN 10 MG PO TABS
10.0000 mg | ORAL_TABLET | Freq: Every day | ORAL | Status: DC
  Filled 2012-04-27 (×2): qty 1

## 2012-04-27 MED ORDER — FAMOTIDINE 20 MG PO TABS
20.0000 mg | ORAL_TABLET | Freq: Two times a day (BID) | ORAL | Status: DC
  Administered 2012-04-28: 20 mg via ORAL
  Filled 2012-04-27 (×2): qty 1

## 2012-04-27 SURGICAL SUPPLY — 75 items
APPLIER CLIP 9.375 MED OPEN (MISCELLANEOUS)
ATCH SMKEVC FLXB CAUT HNDSWH (FILTER) ×1 IMPLANT
BAG DECANTER FOR FLEXI CONT (MISCELLANEOUS) ×4 IMPLANT
BINDER BREAST LRG (GAUZE/BANDAGES/DRESSINGS) IMPLANT
BINDER BREAST XLRG (GAUZE/BANDAGES/DRESSINGS) ×2 IMPLANT
BIOPATCH RED 1 DISK 7.0 (GAUZE/BANDAGES/DRESSINGS) ×4 IMPLANT
CANISTER SUCTION 2500CC (MISCELLANEOUS) ×4 IMPLANT
CHLORAPREP W/TINT 26ML (MISCELLANEOUS) ×4 IMPLANT
CLIP APPLIE 9.375 MED OPEN (MISCELLANEOUS) IMPLANT
CLOTH BEACON ORANGE TIMEOUT ST (SAFETY) ×4 IMPLANT
CONT SPEC 4OZ CLIKSEAL STRL BL (MISCELLANEOUS) ×8 IMPLANT
COVER PROBE W GEL 5X96 (DRAPES) ×2 IMPLANT
COVER SURGICAL LIGHT HANDLE (MISCELLANEOUS) ×4 IMPLANT
DERMABOND ADVANCED (GAUZE/BANDAGES/DRESSINGS) ×1
DERMABOND ADVANCED .7 DNX12 (GAUZE/BANDAGES/DRESSINGS) ×1 IMPLANT
DRAIN CHANNEL 19F RND (DRAIN) ×6 IMPLANT
DRAPE LAPAROSCOPIC ABDOMINAL (DRAPES) IMPLANT
DRAPE ORTHO SPLIT 77X108 STRL (DRAPES) ×2
DRAPE PROXIMA HALF (DRAPES) ×6 IMPLANT
DRAPE SURG 17X23 STRL (DRAPES) ×4 IMPLANT
DRAPE SURG ORHT 6 SPLT 77X108 (DRAPES) ×2 IMPLANT
DRAPE UTILITY 15X26 W/TAPE STR (DRAPE) ×4 IMPLANT
DRAPE WARM FLUID 44X44 (DRAPE) ×2 IMPLANT
DRSG PAD ABDOMINAL 8X10 ST (GAUZE/BANDAGES/DRESSINGS) ×2 IMPLANT
DRSG TEGADERM 4X4.75 (GAUZE/BANDAGES/DRESSINGS) ×2 IMPLANT
ELECT BLADE 6.5 EXT (BLADE) IMPLANT
ELECT CAUTERY BLADE 6.4 (BLADE) ×4 IMPLANT
ELECT REM PT RETURN 9FT ADLT (ELECTROSURGICAL) ×2
ELECTRODE REM PT RTRN 9FT ADLT (ELECTROSURGICAL) ×1 IMPLANT
EVACUATOR SILICONE 100CC (DRAIN) ×4 IMPLANT
EVACUATOR SMOKE ACCUVAC VALLEY (FILTER) ×1
GLOVE BIO SURGEON STRL SZ 6.5 (GLOVE) ×2 IMPLANT
GLOVE BIO SURGEON STRL SZ7 (GLOVE) ×4 IMPLANT
GLOVE BIO SURGEON STRL SZ7.5 (GLOVE) ×2 IMPLANT
GLOVE BIO SURGEON STRL SZ8 (GLOVE) ×4 IMPLANT
GLOVE BIOGEL M 6.5 STRL (GLOVE) ×2 IMPLANT
GLOVE BIOGEL PI IND STRL 6.5 (GLOVE) ×2 IMPLANT
GLOVE BIOGEL PI IND STRL 7.5 (GLOVE) ×1 IMPLANT
GLOVE BIOGEL PI IND STRL 8 (GLOVE) ×2 IMPLANT
GLOVE BIOGEL PI INDICATOR 6.5 (GLOVE) ×2
GLOVE BIOGEL PI INDICATOR 7.5 (GLOVE) ×1
GLOVE BIOGEL PI INDICATOR 8 (GLOVE) ×2
GLOVE ECLIPSE 6.5 STRL STRAW (GLOVE) ×4 IMPLANT
GLOVE SURG SS PI 6.5 STRL IVOR (GLOVE) ×6 IMPLANT
GLOVE SURG SS PI 7.0 STRL IVOR (GLOVE) ×2 IMPLANT
GOWN PREVENTION PLUS XLARGE (GOWN DISPOSABLE) ×2 IMPLANT
GOWN STRL NON-REIN LRG LVL3 (GOWN DISPOSABLE) ×14 IMPLANT
IMPLANT TISSUE EXPANDER MOD H (Tissue) ×2 IMPLANT
KIT BASIN OR (CUSTOM PROCEDURE TRAY) ×2 IMPLANT
KIT ROOM TURNOVER OR (KITS) ×4 IMPLANT
MARKER SKIN DUAL TIP RULER LAB (MISCELLANEOUS) ×2 IMPLANT
NEEDLE 18GX1X1/2 (RX/OR ONLY) (NEEDLE) ×2 IMPLANT
NEEDLE 21 GA WING INFUSION (NEEDLE) ×2 IMPLANT
NEEDLE HYPO 25GX1X1/2 BEV (NEEDLE) ×2 IMPLANT
NS IRRIG 1000ML POUR BTL (IV SOLUTION) ×6 IMPLANT
PACK GENERAL/GYN (CUSTOM PROCEDURE TRAY) ×4 IMPLANT
PAD ARMBOARD 7.5X6 YLW CONV (MISCELLANEOUS) ×4 IMPLANT
PIN SAFETY STERILE (MISCELLANEOUS) ×2 IMPLANT
PREFILTER EVAC NS 1 1/3-3/8IN (MISCELLANEOUS) ×2 IMPLANT
SET ASEPTIC TRANSFER (MISCELLANEOUS) ×2 IMPLANT
SPECIMEN JAR X LARGE (MISCELLANEOUS) IMPLANT
SPONGE LAP 18X18 X RAY DECT (DISPOSABLE) ×2 IMPLANT
SUT ETHILON 3 0 FSL (SUTURE) IMPLANT
SUT MNCRL AB 3-0 PS2 18 (SUTURE) ×10 IMPLANT
SUT MNCRL AB 4-0 PS2 18 (SUTURE) IMPLANT
SUT PDS AB 3-0 SH 27 (SUTURE) IMPLANT
SUT PROLENE 3 0 PS 2 (SUTURE) ×4 IMPLANT
SUT VIC AB 3-0 SH 18 (SUTURE) ×2 IMPLANT
SYR BULB IRRIGATION 50ML (SYRINGE) ×4 IMPLANT
SYR CONTROL 10ML LL (SYRINGE) ×2 IMPLANT
TAPE CLOTH SURG 4X10 WHT LF (GAUZE/BANDAGES/DRESSINGS) ×2 IMPLANT
TOWEL OR 17X24 6PK STRL BLUE (TOWEL DISPOSABLE) ×4 IMPLANT
TOWEL OR 17X26 10 PK STRL BLUE (TOWEL DISPOSABLE) ×4 IMPLANT
TRAY FOLEY CATH 14FRSI W/METER (CATHETERS) ×2 IMPLANT
TUBE CONNECTING 12X1/4 (SUCTIONS) ×2 IMPLANT

## 2012-04-27 NOTE — Anesthesia Preprocedure Evaluation (Signed)
Anesthesia Evaluation  Patient identified by MRN, date of birth, ID band Patient awake    Reviewed: Allergy & Precautions, H&P , NPO status , Patient's Chart, lab work & pertinent test results  Airway Mallampati: I TM Distance: >3 FB     Dental  (+) Teeth Intact   Pulmonary          Cardiovascular hypertension, Pt. on medications     Neuro/Psych    GI/Hepatic GERD-  Controlled,  Endo/Other    Renal/GU      Musculoskeletal   Abdominal   Peds  Hematology   Anesthesia Other Findings   Reproductive/Obstetrics                           Anesthesia Physical Anesthesia Plan  ASA: II  Anesthesia Plan: General   Post-op Pain Management:    Induction: Intravenous  Airway Management Planned: LMA and Oral ETT  Additional Equipment:   Intra-op Plan:   Post-operative Plan: Extubation in OR  Informed Consent:   Dental advisory given  Plan Discussed with: CRNA, Anesthesiologist and Surgeon  Anesthesia Plan Comments:         Anesthesia Quick Evaluation

## 2012-04-27 NOTE — Interval H&P Note (Signed)
History and Physical Interval Note:  04/27/2012 8:29 AM  Sandy Morales  has presented today for surgery, with the diagnosis of DCIS LEFT BREAST CANCER   The various methods of treatment have been discussed with the patient and family. After consideration of risks, benefits and other options for treatment, the patient has consented to  Procedure(s) (LRB) with comments: MASTECTOMY WITH SENTINEL LYMPH NODE BIOPSY (Left) - left mastectomy and sentinel lymph node biopsy TISSUE EXPANDER (Left) - PLACEMENT OF LEFT BREAST TISSUE EXPANDER WITH POSSIBLE HD FLEX  as a surgical intervention .  The patient's history has been reviewed, patient examined, no change in status, stable for surgery.  I have reviewed the patient's chart and labs.  Questions were answered to the patient's satisfaction.     Marine Lezotte A.

## 2012-04-27 NOTE — Transfer of Care (Signed)
Immediate Anesthesia Transfer of Care Note  Patient: Sandy Morales  Procedure(s) Performed: Procedure(s) (LRB) with comments: MASTECTOMY WITH SENTINEL LYMPH NODE BIOPSY (Left) - LEFT SIMPLE MASTECTOMY; SENTINEL LYMPH NODE BIOPSY TISSUE EXPANDER (Left) - PLACEMENT OF LEFT BREAST TISSUE EXPANDER  Patient Location: PACU  Anesthesia Type:General  Level of Consciousness: awake, alert , oriented and sedated  Airway & Oxygen Therapy: Patient Spontanous Breathing and Patient connected to nasal cannula oxygen  Post-op Assessment: Report given to PACU RN, Post -op Vital signs reviewed and stable and Patient moving all extremities  Post vital signs: Reviewed and stable  Complications: No apparent anesthesia complications

## 2012-04-27 NOTE — Anesthesia Postprocedure Evaluation (Signed)
Anesthesia Post Note  Patient: Sandy Morales  Procedure(s) Performed: Procedure(s) (LRB): MASTECTOMY WITH SENTINEL LYMPH NODE BIOPSY (Left) TISSUE EXPANDER (Left)  Anesthesia type: General  Patient location: PACU  Post pain: Pain level controlled and Adequate analgesia  Post assessment: Post-op Vital signs reviewed, Patient's Cardiovascular Status Stable, Respiratory Function Stable, Patent Airway and Pain level controlled  Last Vitals:  Filed Vitals:   04/27/12 1330  BP: 132/70  Pulse: 66  Temp:   Resp:     Post vital signs: Reviewed and stable  Level of consciousness: awake, alert  and oriented  Complications: No apparent anesthesia complications

## 2012-04-27 NOTE — Plan of Care (Signed)
Problem: Consults Goal: Diagnosis-Modified Radical Mastectomy Outcome: Completed/Met Date Met:  04/27/12 Left Mastectomy

## 2012-04-27 NOTE — Preoperative (Signed)
Beta Blockers   Reason not to administer Beta Blockers:Not Applicable 

## 2012-04-27 NOTE — Brief Op Note (Signed)
04/27/2012  11:42 AM  PATIENT:  Sandy Morales  62 y.o. female  PRE-OPERATIVE DIAGNOSIS:  DCIS LEFT BREAST CANCER   POST-OPERATIVE DIAGNOSIS:  DCIS LEFT BREAST CANCER   PROCEDURE:  Procedure(s) (LRB) with comments: MASTECTOMY WITH SENTINEL LYMPH NODE BIOPSY (Left) - LEFT SIMPLE MASTECTOMY; SENTINEL LYMPH NODE BIOPSY TISSUE EXPANDER (Left) - PLACEMENT OF LEFT BREAST TISSUE EXPANDER WITH POSSIBLE HD FLEX   SURGEON:  Surgeon(s) and Role: Panel 1:    * Velora Heckler, MD - Assisting    * Kendell Sagraves A. Azul Brumett, MD - Primary  Panel 2:    * Etter Sjogren, MD - Primary  PHYSICIAN ASSISTANT:   ASSISTANTS: none   ANESTHESIA:   general  EBL:  Total I/O In: 1400 [I.V.:1400] Out: 250 [Urine:150; Blood:100]  BLOOD ADMINISTERED:none  DRAINS: none   LOCAL MEDICATIONS USED:  NONE  SPECIMEN:  Source of Specimen:  LEFT BREAST  DISPOSITION OF SPECIMEN:  PATHOLOGY  COUNTS:  YES  TOURNIQUET:  * No tourniquets in log *  DICTATION: .Other Dictation: Dictation Number [id number]  PLAN OF CARE: Admit for overnight observation  PATIENT DISPOSITION:  PACU - hemodynamically stable.   Delay start of Pharmacological VTE agent (>24hrs) due to surgical blood loss or risk of bleeding: no

## 2012-04-27 NOTE — Anesthesia Procedure Notes (Signed)
Procedure Name: Intubation Date/Time: 04/27/2012 10:27 AM Performed by: Fransisca Kaufmann Pre-anesthesia Checklist: Patient identified, Timeout performed, Emergency Drugs available, Suction available and Patient being monitored Patient Re-evaluated:Patient Re-evaluated prior to inductionOxygen Delivery Method: Circle system utilized Preoxygenation: Pre-oxygenation with 100% oxygen Intubation Type: IV induction Ventilation: Mask ventilation without difficulty Laryngoscope Size: Miller and 2 Grade View: Grade I Tube type: Oral Tube size: 7.5 mm Number of attempts: 1 Airway Equipment and Method: Stylet Placement Confirmation: ETT inserted through vocal cords under direct vision,  positive ETCO2 and breath sounds checked- equal and bilateral Secured at: 21 cm Tube secured with: Tape Dental Injury: Teeth and Oropharynx as per pre-operative assessment

## 2012-04-27 NOTE — H&P (Signed)
Demographics Sandy Morales 62 year old female  Comm Pref: None [address]  [place] [address] [phone] (H)    Problem ListUnprioritized  HYPERLIPIDEMIA  HYPERTENSION  DYSPEPSIA  SHOULDER PAIN, LEFT  MUSCLE SPASM, TRAPEZIUS MUSCLE, LEFT  Cancer of upper-outer quadrant of female breast  Significant History/Details  Smoking: Never Smoker   Smokeless Tobacco: Unknown  Alcohol: No  1 open order  Language: English   Specialty CommentsEditShow AllReport10/9/13 PT SIGNED Sandy Morales AND Sandy Morales DOS 04/27/12 TC/TG(ONLY ASSIST AVAILABE)([facility] t/f)-[facility]- Lt mastectomy w/ SLNbx (nuc med 8:00)/ cef 02/29/12 02/29/2012 patient schdeduled for 23 hrs observation surgery 04/27/2012 @ [facility] no precert required ip/op. (cef,chm)   MedicationsLong-Term  aspirin 81 MG tablet   benazepril-hydrochlorthiazide (LOTENSIN HCT) 10-12.5 MG per tablet   colchicine 0.6 MG tablet   famotidine (PEPCID) 20 MG tablet    nabumetone (RELAFEN) 750 MG tablet   pravastatin (PRAVACHOL) 40 MG tablet    HYDROcodone-acetaminophen (NORCO) 7.5-325 MG per tablet     Relevant Labs (3 years)  Na K Cl C02 WBC Hgb Hct Plts  04/21/12 1108 -- -- -- -- 6.8 12.1 35.7 242  04/21/12 1108 138 3.3 98 -- -- -- -- --  01/26/12 0836 -- -- -- -- 6.7 12.6 37.9 240  01/26/12 0836 142 3.5 108 -- -- -- -- --  09/26/09 2301 141 4.0 104 -- 5.5 12.1 37.7 264                  Relevant Encounters (Maximum of 10 visits)Date Type Department Provider Description  04/27/2012 Surgery [facility] OPERATING ROOM Dortha Schwalbe., MD             Patient ID: Sandy Morales, female   DOB: 1950/08/15, 62 y.o.   MRN: [id number]   No chief complaint on file.   HPI Sandy Morales is a 62 y.o. female.  Pt sent at the request of Dr Azucena Kuba of [facility] due to large area of atypical microcalcifications left breast measuring 9.7 cm in maximal diameter.  Denies breast mass,  Breast pain, or  Nipple  discharge bilaterally. No other complaints or breast history problems. HPI    Past Medical History   Diagnosis  Date   .  Hypertension     .  GERD (gastroesophageal reflux disease)         Past Surgical History   Procedure  Date   .  Knee surgery     .  Abdominal hysterectomy        No family history on file.   Social History History   Substance Use Topics   .  Smoking status:  Not on file   .  Smokeless tobacco:  Not on file   .  Alcohol Use:  Yes         occasonal      No Known Allergies    Current Outpatient Prescriptions   Medication  Sig  Dispense  Refill   .  aspirin 81 MG tablet  Take 81 mg by mouth daily.         .  benazepril-hydrochlorthiazide (LOTENSIN HCT) 10-12.5 MG per tablet  TAKE ONE TABLET BY MOUTH EVERY DAY   30 tablet   12   .  colchicine 0.6 MG tablet  Take 2 now and 1 in 1 hour.  May repeat dose once daily.  For gout attack.   12 tablet   2   .  famotidine (PEPCID) 20 MG tablet  Take 20 mg by mouth 2 (two) times daily.         Marland Kitchen  NABUMETONE PO  Take 750 mg by mouth 2 (two) times daily.         .  pravastatin (PRAVACHOL) 40 MG tablet  TAKE ONE TABLET BY MOUTH EVERY DAY   30 tablet   12   .  PredniSONE 5 MG KIT  Take 1 kit (5 mg total) by mouth daily after breakfast. Prednisone 5 mg 6 day dosepack.  Take as directed.   1 kit   0       No current facility-administered medications for this visit.       Facility-Administered Medications Ordered in Other Visits   Medication  Dose  Route  Frequency  Provider  Last Rate  Last Dose   .  gadobenate dimeglumine (MULTIHANCE) injection 17 mL   17 mL  Intravenous  Once PRN  Medication Radiologist, MD     17 mL at 01/25/12 1050      Review of Systems Review of Systems  Constitutional: Negative for fever, chills and unexpected weight change.  HENT: Negative for hearing loss, congestion, sore throat, trouble swallowing and voice change.   Eyes: Negative for visual disturbance.  Respiratory: Negative for  cough and wheezing.   Cardiovascular: Negative for chest pain, palpitations and leg swelling.  Gastrointestinal: Negative for nausea, vomiting, abdominal pain, diarrhea, constipation, blood in stool, abdominal distention and anal bleeding.  Genitourinary: Negative for hematuria, vaginal bleeding and difficulty urinating.  Musculoskeletal: Negative for arthralgias.  Skin: Negative for rash and wound.  Neurological: Negative for seizures, syncope and headaches.  Hematological: Negative for adenopathy. Does not bruise/bleed easily.  Psychiatric/Behavioral: Negative for confusion.    There were no vitals taken for this visit.   Physical Exam Physical Exam  Constitutional: She is oriented to person, place, and time. She appears well-developed and well-nourished.  HENT:   Head: Normocephalic and atraumatic.  Eyes: EOM are normal. Pupils are equal, round, and reactive to light.  Neck: Normal range of motion. Neck supple.  Cardiovascular: Normal rate and regular rhythm.   Pulmonary/Chest: Effort normal and breath sounds normal. Right breast exhibits no inverted nipple, no mass, no nipple discharge, no skin change and no tenderness. Left breast exhibits no inverted nipple, no mass, no nipple discharge, no skin change and no tenderness. Breasts are symmetrical.    Abdominal: Soft.  Musculoskeletal: Normal range of motion.  Neurological: She is alert and oriented to person, place, and time.  Skin: Skin is warm and dry.  Psychiatric: She has a normal mood and affect. Her behavior is normal. Judgment and thought content normal.    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   BUN and creatinine were obtained on site at [facility] at   [address]   Results: BUN 19 mg/dL, Creatinine 0.8 mg/dL.   BILATERAL BREAST MRI WITH AND WITHOUT CONTRAST   Technique: Multiplanar, multisequence MR images of both breasts   were obtained prior to  and following the intravenous administration   of 17ml of MultiHance. Three dimensional images were evaluated at   the independent DynaCad workstation.   Comparison: Recent mammogram and biopsy examinations.   Findings: Moderate to marked background parenchymal enhancement in   both breasts. This is more confluent and more densely enhancing   with irregular margins in the upper inner and upper outer quadrants  of the left breast. This area has a mixture of enhancement   kinetics, including rapid washin/washout. This area measures 9.7 x   9.4 x 3.6 cm in maximum dimensions. There is a post biopsy   hematoma and biopsy marker clip artifact in the lateral aspect of   this area of asymmetrical enhancement.   A prominent intramammary lymph node is noted in the posterior   aspect of the upper outer quadrant of the left breast. This has   been mammographically stable since 2007. Otherwise, no abnormal   appearing lymph nodes are seen.   No additional masses or areas of enhancement suspicious for   malignancy are seen in either breast.   IMPRESSION:   1. Post biopsy changes in the lateral aspect of a large area of   asymmetrically dense and confluent, irregular enhancement in the   upper inner and upper outer quadrants of the left breast, measuring   9.7 x 9.4 x 3.6 cm in maximum dimensions. This has MR features   suspicious for a large area of DCIS. If breast conservation is   desired, MR guided core needle biopsy of a portion of this area,   remote from the site of the original biopsy, would be recommended   for documentation of extent of disease.   2. Mammographically stable prominent intramammary lymph node deep   in the upper outer quadrant of the left breast. The mammographic   stability is compatible with a benign lymph node.   3. No evidence of malignancy on the right.   RECOMMENDATION:   Left breast MR guided core needle biopsy if breast conservation is   desired. DCIS left breast  pathology pathology Assessment     DCIS Left breast    Plan Pt desires mastectomy after discussion of surgical options.  Area is quite large and breast size with this area would not be compatible with lumpectomy.  Pt desires reconstruction.  Schedule for left mastectomy and SLN mapping for DCIS.  The surgical and non surgical options have been discussed with the patient.  Risks of surgery include bleeding,  Infection,  Flap necrosis,  Tissue loss,  Chronic pain, death, Numbness,  And the need for additional procedures.  Reconstruction options also have been discussed with the patient as well.  The patient agrees to proceed.Sentinel lymph node mapping and dissection has been discussed with the patient.  Risk of bleeding,  Infection,  Seroma formation,  Additional procedures,,  Shoulder weakness ,  Shoulder stiffness,  Nerve and blood vessel injury and reaction to the mapping dyes have been discussed.  Alternatives to surgery have been discussed with the patient.  The patient agrees to proceed.       Artelia Game A.  04/27/2012

## 2012-04-27 NOTE — Progress Notes (Signed)
Report given to kay rn as caregiver 

## 2012-04-27 NOTE — Brief Op Note (Signed)
04/27/2012  12:53 PM  PATIENT:  Sandy Morales  62 y.o. female  PRE-OPERATIVE DIAGNOSIS:  DCIS LEFT BREAST CANCER   POST-OPERATIVE DIAGNOSIS:  DCIS LEFT BREAST CANCER   PROCEDURE:  Procedure(s) (LRB) with comments: MASTECTOMY WITH SENTINEL LYMPH NODE BIOPSY (Left) - LEFT SIMPLE MASTECTOMY; SENTINEL LYMPH NODE BIOPSY TISSUE EXPANDER (Left) - PLACEMENT OF LEFT BREAST TISSUE EXPANDER  SURGEON:  Surgeon(s) and Role: Panel 1:    * Velora Heckler, MD - Assisting    * Thomas A. Cornett, MD - Primary  Panel 2:    * Etter Sjogren, MD - Primary  PHYSICIAN ASSISTANT:   ASSISTANTS: none   ANESTHESIA:   general  EBL:  Total I/O In: 2250 [I.V.:2000; IV Piggyback:250] Out: 435 [Urine:260; Blood:175]  BLOOD ADMINISTERED:none  DRAINS: 2 19 French in mastectomy space   LOCAL MEDICATIONS USED:  NONE  SPECIMEN:  No Specimen  DISPOSITION OF SPECIMEN:  N/A  COUNTS:  YES  TOURNIQUET:  * No tourniquets in log *  DICTATION: .Other Dictation: Dictation Number [id number]  PLAN OF CARE: Admit for overnight observation  PATIENT DISPOSITION:  PACU - hemodynamically stable.   Delay start of Pharmacological VTE agent (>24hrs) due to surgical blood loss or risk of bleeding: yes

## 2012-04-28 ENCOUNTER — Encounter (HOSPITAL_COMMUNITY): Payer: Self-pay | Admitting: Surgery

## 2012-04-28 LAB — CBC
HCT: 30.4 % — ABNORMAL LOW (ref 36.0–46.0)
Hemoglobin: 10.1 g/dL — ABNORMAL LOW (ref 12.0–15.0)
MCH: 31.3 pg (ref 26.0–34.0)
MCHC: 33.2 g/dL (ref 30.0–36.0)
MCV: 94.1 fL (ref 78.0–100.0)
Platelets: 244 K/uL (ref 150–400)
RBC: 3.23 MIL/uL — ABNORMAL LOW (ref 3.87–5.11)
RDW: 11.9 % (ref 11.5–15.5)
WBC: 7.3 K/uL (ref 4.0–10.5)

## 2012-04-28 LAB — BASIC METABOLIC PANEL
BUN: 9 mg/dL (ref 6–23)
CO2: 27 mEq/L (ref 19–32)
Calcium: 9.3 mg/dL (ref 8.4–10.5)
Chloride: 102 mEq/L (ref 96–112)
Creatinine, Ser: 0.73 mg/dL (ref 0.50–1.10)
GFR calc Af Amer: >90 mL/min (ref >90)
GFR calc non Af Amer: 90 mL/min — ABNORMAL LOW (ref >90)
Glucose, Bld: 105 mg/dL — ABNORMAL HIGH (ref 70–99)
Potassium: 3.8 mEq/L (ref 3.5–5.1)
Sodium: 139 mEq/L (ref 135–145)

## 2012-04-28 MED ORDER — OXYCODONE-ACETAMINOPHEN 5-325 MG PO TABS: 1.0000 | ORAL_TABLET | ORAL | Status: DC | PRN

## 2012-04-28 MED ORDER — METHOCARBAMOL 500 MG PO TABS: 500.0000 mg | ORAL_TABLET | Freq: Three times a day (TID) | ORAL | Status: DC

## 2012-04-28 NOTE — Op Note (Signed)
NAMEEMILENE, ROMA NO.:  [id number]  MEDICAL RECORD NO.:  [id number]  LOCATION:  6N01C                        FACILITY:  [facility]  PHYSICIAN:  Etter Sjogren, M.D.     DATE OF BIRTH:  1950-05-10  DATE OF PROCEDURE:  04/27/2012 DATE OF DISCHARGE:                              OPERATIVE REPORT   PREOPERATIVE DIAGNOSIS:  Left breast cancer.  POSTOPERATIVE DIAGNOSIS:  Left breast cancer.  PROCEDURE PERFORMED:  Left breast reconstruction with tissue expander.  SURGEON:  Etter Sjogren, M.D.  ANESTHESIA:  General.  ESTIMATED BLOOD LOSS:  10 mL.  DRAINS:  Two 19-French.  CLINICAL NOTE:  A 62 year old woman has left breast cancer.  Is having mastectomy and desire reconstruction.  Options were discussed and she selected tissue expander followed as a stage procedure, and placement of an implant.  The nature of the procedure and the risks, and possible complications were discussed with her in detail.  These risks include, but not limited to, bleeding, infection, healing problems, scarring, loss of sensation, fluid accumulations, anesthesia complications, asymmetry, disappointment, failure of device, capsular contracture, displacement device, wrinkles, ripples, pulmonary embolism, pneumothorax, and chronic pain.  She understood all of this and wished to proceed.  DESCRIPTION OF THE PROCEDURE:  The patient was taken to the operating room.  General Surgery completed the mastectomy.  The space was irrigated thoroughly with saline.  Excellent hemostasis was confirmed. Submuscular space was developed using cautery and great care was taken to avoid damage to underlying chest cavity.  This space was developed deep to the pectoralis major and a portion of serratus muscle laterally. The space had been developed to the dimensions of the expander, irrigation with saline, and irrigation with antibiotic solution. Meticulous hemostasis with electrocautery.  The expander was  prepared after thoroughly cleaning gloves, this was a Mentor 6050 CC tissue expander, moderate height.  150 mL of sterile saline placed using a closed filling system and the expander was returned to antibiotic solution in which it soaked for greater than 5 minutes.  The expander was then positioned.  The muscle was closed with 3-0 Vicryl interrupted figure-of-eight sutures.  An additional 100 mL of saline placed and well tolerated.  Two 19-French drains positioned, brought out through separate stab wounds inferolaterally and secured with 3-0 Prolene suture.  Excellent hemostasis confirmed.  Skin closure with 3-0 Monocryl inverted deep dermal sutures and Dermabond.  Biopatch placed around the drains and a medium Tegaderm placed and dry sterile dressings, and the breast vest placed and she was transferred to the recovery room stable and tolerated procedure well.     Etter Sjogren, M.D.     DB/MEDQ  D:  04/27/2012  T:  04/28/2012  Job:  [id number]

## 2012-04-28 NOTE — Progress Notes (Signed)
  Good pain control. No complaints.  Objective: Vital signs in last 24 hours: Temp:  [97.5 F (36.4 C)-98.8 F (37.1 C)] 98.2 F (36.8 C) (01/10 0938) Pulse Rate:  [64-87] 87  (01/10 0938) Resp:  [15-20] 18  (01/10 0938) BP: (88-142)/(61-78) 129/78 mmHg (01/10 0938) SpO2:  [97 %-100 %] 99 % (01/10 0938) Weight:  [197 lb 15.6 oz (89.8 kg)] 197 lb 15.6 oz (89.8 kg) (01/09 1445)  Intake/Output from previous day: 01/09 0701 - 01/10 0700 In: 4091 [P.O.:360; I.V.:3431; IV Piggyback:300] Out: 1570 [Urine:1010; Drains:385; Blood:175] Intake/Output this shift: Total I/O In: -  Out: 835 [Urine:800; Drains:35]  Operative sites: Mastectomy flaps viable. Look very good thus far. Tissue expander appears to be in good position. Drains functioning. Drainage thin. No evidence of bleeding, infection, or vascular compromise.   Basename 04/28/12 0515 04/27/12 1617  WBC 7.3 10.7*  HGB 10.1* 10.8*  HCT 30.4* 31.4*  NA 139 --  K 3.8 --  CL 102 --  CO2 27 --  BUN 9 --  CREATININE 0.73 0.70  GLU -- --    Studies/Results: Nm Sentinel Node Inj-no Rpt (breast)  04/27/2012  CLINICAL DATA: dcis   Sulfur colloid was injected intradermally by the nuclear medicine  technologist for breast cancer sentinel node localization.      Assessment/Plan: OK for discharge. I will see her next week.  LOS: 1 day    Rossie Muskrat 04/28/2012 12:07 PM

## 2012-04-28 NOTE — Discharge Planning (Signed)
d'cd per w/c to private car home with all personal belongings, acomp. By family. Pt has copy of home instructions.

## 2012-04-28 NOTE — Progress Notes (Signed)
Notified Dr. Carolynne Edouard that pt IV infiltrated. Per Dr. Carolynne Edouard leave IV out for now and consult with rounding physician about IV antibiotics and fluids.

## 2012-04-28 NOTE — Progress Notes (Signed)
1 Day Post-Op  Subjective: Had a good night  Objective: Vital signs in last 24 hours: Temp:  [97.5 F (36.4 C)-98.8 F (37.1 C)] 98.6 F (37 C) (01/10 0612) Pulse Rate:  [64-82] 82  (01/10 0612) Resp:  [15-20] 16  (01/10 0612) BP: (88-142)/(61-76) 109/70 mmHg (01/10 0612) SpO2:  [97 %-100 %] 98 % (01/10 0612) Weight:  [197 lb 15.6 oz (89.8 kg)] 197 lb 15.6 oz (89.8 kg) (01/09 1445) Last BM Date: 04/27/12  Intake/Output from previous day: 01/09 0701 - 01/10 0700 In: 4091 [P.O.:360; I.V.:3431; IV Piggyback:300] Out: 1570 [Urine:1010; Drains:385; Blood:175] Intake/Output this shift:    Incision/Wound:clean dry intact.  Skin viable.  No hematoma  Lab Results:   Basename 04/28/12 0515 04/27/12 1617  WBC 7.3 10.7*  HGB 10.1* 10.8*  HCT 30.4* 31.4*  PLT 244 250   BMET  Basename 04/28/12 0515 04/27/12 1617  NA 139 --  K 3.8 --  CL 102 --  CO2 27 --  GLUCOSE 105* --  BUN 9 --  CREATININE 0.73 0.70  CALCIUM 9.3 --   PT/INR No results found for this basename: LABPROT:2,INR:2 in the last 72 hours ABG No results found for this basename: PHART:2,PCO2:2,PO2:2,HCO3:2 in the last 72 hours  Studies/Results: Nm Sentinel Node Inj-no Rpt (breast)  04/27/2012  CLINICAL DATA: dcis   Sulfur colloid was injected intradermally by the nuclear medicine  technologist for breast cancer sentinel node localization.      Anti-infectives: Anti-infectives     Start     Dose/Rate Route Frequency Ordered Stop   04/27/12 1800   ceFAZolin (ANCEF) IVPB 1 g/50 mL premix        1 g 100 mL/hr over 30 Minutes Intravenous 3 times per day 04/27/12 1543     04/27/12 1142   polymyxin B 500,000 Units, bacitracin 50,000 Units in sodium chloride irrigation 0.9 % 500 mL irrigation  Status:  Discontinued          As needed 04/27/12 1143 04/27/12 1304   04/27/12 0845   ceFAZolin (ANCEF) IVPB 2 g/50 mL premix        2 g 100 mL/hr over 30 Minutes Intravenous  Once 04/27/12 0841 04/27/12 1030           Assessment/Plan: s/p Procedure(s) (LRB) with comments: MASTECTOMY WITH SENTINEL LYMPH NODE BIOPSY (Left) - LEFT SIMPLE MASTECTOMY; SENTINEL LYMPH NODE BIOPSY TISSUE EXPANDER (Left) - PLACEMENT OF LEFT BREAST TISSUE EXPANDER Discharge  LOS: 1 day    Garyn Arlotta A. 04/28/2012

## 2012-04-28 NOTE — Discharge Summary (Signed)
Physician Discharge Summary  Patient ID: Sandy Morales MRN: [id number] DOB/AGE: 1950-09-14 62 y.o.  Admit date: 04/27/2012 Discharge date: 04/28/2012  Admission Diagnoses: left Breast DCIS  Discharge Diagnoses: SAME Active Problems:  * No active hospital problems. *    Discharged Condition: good  Hospital Course: UNREMARKABLE.    Consults: None  Significant Diagnostic Studies: NONE  Treatments: surgery: LEFT SIMPLE MASTECTOMY WITH SLN mapping  Discharge Exam: Blood pressure 109/70, pulse 82, temperature 98.6 F (37 C), temperature source Oral, resp. rate 16, height 5' (1.524 m), weight 197 lb 15.6 oz (89.8 kg), SpO2 98.00%. Incision/Wound:clean dry intact without hematoma  Disposition: 01-Home or Self Care  Discharge Orders    Future Appointments: Provider: Department: Dept Phone: Center:   05/18/2012 12:00 PM Lowella Dell, MD [facility] MEDICAL ONCOLOGY [phone] None       Medication List     As of 04/28/2012  8:52 AM    ASK your doctor about these medications         aspirin 81 MG tablet   Take 81 mg by mouth daily.      benazepril-hydrochlorthiazide 10-12.5 MG per tablet   Commonly known as: LOTENSIN HCT   Take 1 tablet by mouth daily.      colchicine 0.6 MG tablet   Take 2 now and 1 in 1 hour.  May repeat dose once daily.  For gout attack.      famotidine 20 MG tablet   Commonly known as: PEPCID   Take 20 mg by mouth 2 (two) times daily.      HYDROcodone-acetaminophen 7.5-325 MG per tablet   Commonly known as: NORCO   Take 1 tablet by mouth every 6 (six) hours as needed.      nabumetone 750 MG tablet   Commonly known as: RELAFEN   Take 750 mg by mouth 2 (two) times daily.      pravastatin 40 MG tablet   Commonly known as: PRAVACHOL   Take 40 mg by mouth daily.         Signed: Leianne Callins A. 04/28/2012, 8:52 AM

## 2012-04-28 NOTE — Op Note (Signed)
NAMEANAMARI, GALEAS              ACCOUNT NO.:  [id number]  MEDICAL RECORD NO.:  [id number]  LOCATION:  6N01C                        FACILITY:  [facility]  PHYSICIAN:  Maisie Fus A. Aylanie Cubillos, M.D.DATE OF BIRTH:  13-Apr-1951  DATE OF PROCEDURE:  04/27/2012 DATE OF DISCHARGE:                              OPERATIVE REPORT   PREOPERATIVE DIAGNOSIS:  Left breast ductal carcinoma in situ.  POSTOPERATIVE DIAGNOSIS:  Left breast ductal carcinoma in situ.  PROCEDURES: 1. Left simple mastectomy. 2. Left axillary sentinel lymph node mapping.  SURGEON:  Maisie Fus A. Yehia Mcbain, M.D.  ASSISTANT:  Velora Heckler, MD  EBL:  Less than 60 mL.  SPECIMEN:  Left breast with four left axillary sentinel nodes to Pathology.  INDICATIONS FOR PROCEDURE:  The patient is a 62 year old female with left breast DCIS.  She has opted for simple mastectomy on the left and reconstruction per Dr. Odis Luster of Plastic Surgery.  She met with both of those preoperatively and the procedures were discussed as well as her risks, benefits and long-term expectations.  She agreed to proceed. Risk of bleeding, infection, flap necrosis, the need for further procedure were discussed.  DESCRIPTION OF PROCEDURE:  The patient has met in the holding area and questions were answered.  The left breast was marked.  Nuclear Medicine injected in the left breast for mapping.  She was then taken back to the operative room and placed supine on the operating room table where general anesthesia was initiated.  Foley catheter was placed.  Upper chest and both arms regions were prepped and draped in sterile fashion. A time-out was done.  She received 2 g of Ancef.  Neoprobe was used to identify hot spot in the left axilla.  Curvilinear incision was made for superior and inferior skin flaps in the left and superior skin flap was taken to the clavicle and inferior skin flap was taken down to the inframammary fold.  The breast was taken off the chest wall  with cautery.  Once we got in the axilla, Neoprobe was used and four sentinel nodes were identified.  Background counts of the axilla were less than 10% of the primary counts at the nipple injection site.  The nodes looked grossly normal.  I did not feel any suspicious adenopathy.  Long thoracic nerve and thoracodorsal trunk were preserved.  There were two of the intercostal brachial nerves were sacrificed.  At this point in time, hemostasis was achieved.  Dr. Odis Luster was then scrubbed in to complete the procedure.  See his note for details.     Maximina Pirozzi A. Felisia Balcom, M.D.     TAC/MEDQ  D:  04/27/2012  T:  04/28/2012  Job:  [id number]

## 2012-04-28 NOTE — Progress Notes (Addendum)
Spoke with Tresa Endo PA regarding stopping or continuing Relafen at dc.  Pt is to continue at discharge.  Listed as med for dc in MD's DC note, but to stop on AVS. AVS changed to continue Relafen. Dr. Odis Luster here and does not want pt to take her Relafen and dc d/t possible bleeding, AVS changed to reflect this.

## 2012-05-02 ENCOUNTER — Ambulatory Visit: Payer: Self-pay | Admitting: Oncology

## 2012-05-02 ENCOUNTER — Other Ambulatory Visit (INDEPENDENT_AMBULATORY_CARE_PROVIDER_SITE_OTHER): Payer: Self-pay | Admitting: Surgery

## 2012-05-02 DIAGNOSIS — D0510 Intraductal carcinoma in situ of unspecified breast: Secondary | ICD-10-CM

## 2012-05-18 ENCOUNTER — Ambulatory Visit: Payer: Self-pay | Admitting: Oncology

## 2012-07-31 ENCOUNTER — Telehealth: Payer: Self-pay | Admitting: *Deleted

## 2012-07-31 NOTE — Telephone Encounter (Signed)
Pt left a message on my voicemail to call her cause she has some questions.  Called and left a message for pt to return my call or she may call Dawn since Milnor was her navigator.

## 2012-07-31 NOTE — Telephone Encounter (Signed)
Pt returned my call and I answered all questions.  Informed pt that she has missed the last two appts w/ Dr. Darnelle Catalan and we need to get her back in.  Confirmed 08/16/12 appt w/ pt.  Mailed calendar to pt.  Pt stated that she needed a wig voucher and asked if she could get at time of appt and I told her yes, we would have ready.

## 2012-08-02 ENCOUNTER — Telehealth: Payer: Self-pay | Admitting: *Deleted

## 2012-08-02 NOTE — Telephone Encounter (Signed)
Pt called stating that she needs help getting some medicine due to not having the money for it.  Told pt that she needed to apply for assistance w/ our [employer] staff and gave her the correct information to do so.

## 2012-08-16 ENCOUNTER — Telehealth: Payer: Self-pay | Admitting: Oncology

## 2012-08-16 ENCOUNTER — Other Ambulatory Visit: Payer: Self-pay | Admitting: Emergency Medicine

## 2012-08-16 ENCOUNTER — Ambulatory Visit (HOSPITAL_BASED_OUTPATIENT_CLINIC_OR_DEPARTMENT_OTHER): Payer: Medicare Other | Admitting: Oncology

## 2012-08-16 ENCOUNTER — Other Ambulatory Visit (HOSPITAL_BASED_OUTPATIENT_CLINIC_OR_DEPARTMENT_OTHER): Payer: Medicare Other | Admitting: Lab

## 2012-08-16 VITALS — BP 134/85 | HR 85 | Temp 98.2°F | Resp 20 | Ht 60.0 in | Wt 202.3 lb

## 2012-08-16 DIAGNOSIS — M899 Disorder of bone, unspecified: Secondary | ICD-10-CM

## 2012-08-16 DIAGNOSIS — C773 Secondary and unspecified malignant neoplasm of axilla and upper limb lymph nodes: Secondary | ICD-10-CM

## 2012-08-16 DIAGNOSIS — C50419 Malignant neoplasm of upper-outer quadrant of unspecified female breast: Secondary | ICD-10-CM

## 2012-08-16 DIAGNOSIS — C50919 Malignant neoplasm of unspecified site of unspecified female breast: Secondary | ICD-10-CM | POA: Insufficient documentation

## 2012-08-16 DIAGNOSIS — E2839 Other primary ovarian failure: Secondary | ICD-10-CM

## 2012-08-16 DIAGNOSIS — Z789 Other specified health status: Secondary | ICD-10-CM

## 2012-08-16 DIAGNOSIS — Z780 Asymptomatic menopausal state: Secondary | ICD-10-CM

## 2012-08-16 LAB — CBC WITH DIFFERENTIAL/PLATELET
BASO%: 0.7 % (ref 0.0–2.0)
Basophils Absolute: 0 10*3/uL (ref 0.0–0.1)
EOS%: 2.7 % (ref 0.0–7.0)
Eosinophils Absolute: 0.2 10*3/uL (ref 0.0–0.5)
HCT: 40.3 % (ref 34.8–46.6)
HGB: 13.5 g/dL (ref 11.6–15.9)
LYMPH%: 37.2 % (ref 14.0–49.7)
MCH: 30.5 pg (ref 25.1–34.0)
MCHC: 33.4 g/dL (ref 31.5–36.0)
MCV: 91.4 fL (ref 79.5–101.0)
MONO#: 0.7 10*3/uL (ref 0.1–0.9)
MONO%: 11.1 % (ref 0.0–14.0)
NEUT#: 2.9 10*3/uL (ref 1.5–6.5)
NEUT%: 48.3 % (ref 38.4–76.8)
Platelets: 270 10*3/uL (ref 145–400)
RBC: 4.41 10*6/uL (ref 3.70–5.45)
RDW: 13.2 % (ref 11.2–14.5)
WBC: 5.9 10*3/uL (ref 3.9–10.3)
lymph#: 2.2 10*3/uL (ref 0.9–3.3)

## 2012-08-16 LAB — COMPREHENSIVE METABOLIC PANEL (CC13)
ALT: 16 U/L (ref 0–55)
AST: 15 U/L (ref 5–34)
Albumin: 4 g/dL (ref 3.5–5.0)
Alkaline Phosphatase: 89 U/L (ref 40–150)
BUN: 15.2 mg/dL (ref 7.0–26.0)
CO2: 28 mEq/L (ref 22–29)
Calcium: 10 mg/dL (ref 8.4–10.4)
Chloride: 101 mEq/L (ref 98–107)
Creatinine: 1 mg/dL (ref 0.6–1.1)
Glucose: 107 mg/dl — ABNORMAL HIGH (ref 70–99)
Potassium: 3.5 mEq/L (ref 3.5–5.1)
Sodium: 140 mEq/L (ref 136–145)
Total Bilirubin: 0.77 mg/dL (ref 0.20–1.20)
Total Protein: 8.3 g/dL (ref 6.4–8.3)

## 2012-08-16 MED ORDER — ANASTROZOLE 1 MG PO TABS: 1.0000 mg | ORAL_TABLET | Freq: Every day | ORAL | Status: DC

## 2012-08-16 NOTE — Progress Notes (Signed)
ID: [name]   DOB: Nov 20, 1950  MR#: [id number]  YQM#:[id number]  PCP: Quitman Livings MD GYN:  SU: Harriette Bouillon OTHER MD: Dr. Yisroel Ramming, Dr Lurline Hare, Etter Sjogren  HISTORY OF PRESENT ILLNESS: Ms. Nickolson had routine screening mammography at [facility] 01/04/2012 showing heterogeneously dense breasts. In addition, in the left breast, new calcifications were noted. Left diagnostic mammography at the breast center 01/11/2012 confirmed amorphous calcifications in the left upper outer quadrant. Biopsy was performed 01/18/2012, and showed ([id number]) ductal carcinoma in situ, grade 3, strongly estrogen and progesterone receptor positive. The patient's subsequent history is as detailed below.  INTERVAL HISTORY: Azriella returns together with her sister for followup of Bhavya's breast cancer. Since her last visit with me she had her definitive surgery. The surprise there was that though no invasive disease was documented in the breast there was a positive lymph nodes with a micrometastatic deposit.  REVIEW OF SYSTEMS: She did well with her surgery, without unusual pain, fever, bleeding, or other complications. She is back to her routine at present. Unfortunately she does not exercise regularly. A detailed review of systems today was unremarkable.  PAST MEDICAL HISTORY: Past Medical History  Diagnosis Date  . Hypertension   . GERD (gastroesophageal reflux disease)   . Breast cancer   . Dyslipidemia   . Osteoarthritis   . Gout     PAST SURGICAL HISTORY: Past Surgical History  Procedure Laterality Date  . Knee surgery    . Abdominal hysterectomy    . Cyst excision      rt middle finger  . Mastectomy  04/27/2012     left breast  with tissue expander  . Mastectomy w/ sentinel node biopsy  04/27/2012    Procedure: MASTECTOMY WITH SENTINEL LYMPH NODE BIOPSY;  Surgeon: Clovis Pu. Cornett, MD;  Location: [facility] OR;  Service: General;  Laterality: Left;  LEFT SIMPLE MASTECTOMY;  SENTINEL LYMPH NODE BIOPSY  . Tissue expander placement  04/27/2012    Procedure: TISSUE EXPANDER;  Surgeon: Etter Sjogren, MD;  Location: [facility] OR;  Service: Plastics;  Laterality: Left;  PLACEMENT OF LEFT BREAST TISSUE EXPANDER    FAMILY HISTORY Family History  Problem Relation Age of Onset  . Lung cancer Mother   . Lung cancer Father    the patient's father died from lung cancer in the setting of tobacco abuse. The patient's mother died possibly from cancer as well. The patient is not quite sure of the age of death of either of her parents. Ms. Verge had 3 brothers and 7 sisters. She is not aware of any breast or ovarian cancer history in the family.  GYNECOLOGIC HISTORY: First menstrual period age 82. First live birth age 52. She is GX P3. She underwent TAH-BSO in 1995. She never used hormone replacement.  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 She has been widowed for approximately 11 years. She lives by herself. Her children are out Glean Salen, who is currently incarcerated; Bianca Raneri, who lives in [place] and works in Holiday representative; and Nyara Capell, who is disabled secondary to neuro fibromatosis. The patient has 16 grandchildren and 2 great-grandchildren. She attends a Nurse, adult church.   ADVANCED DIRECTIVES: Not in place; the patient tells me she has talked with their sons Timthony and Aurther Loft and that they would make decisions together if she became mantle incapacitated.  HEALTH MAINTENANCE: History  Substance Use Topics  . Smoking status: Never Smoker   . Smokeless tobacco: Never Used     Comment: last marijunia 04/15/12  . Alcohol Use: No     Comment: occasonal     Colonoscopy: 2011  PAP: s/p hyst  Bone density: never  Lipid panel:  No Known Allergies  Current Outpatient Prescriptions  Medication Sig Dispense Refill  . anastrozole  (ARIMIDEX) 1 MG tablet Take 1 tablet (1 mg total) by mouth daily.  30 tablet  12  . aspirin 81 MG tablet Take 81 mg by mouth daily.      . benazepril-hydrochlorthiazide (LOTENSIN HCT) 10-12.5 MG per tablet Take 1 tablet by mouth daily.      . colchicine 0.6 MG tablet Take 2 now and 1 in 1 hour.  May repeat dose once daily.  For gout attack.  12 tablet  2  . famotidine (PEPCID) 20 MG tablet Take 20 mg by mouth 2 (two) times daily.      . methocarbamol (ROBAXIN) 500 MG tablet Take 1 tablet (500 mg total) by mouth 3 (three) times daily.  30 tablet  0  . oxyCODONE-acetaminophen (PERCOCET/ROXICET) 5-325 MG per tablet Take 1-2 tablets by mouth every 4 (four) hours as needed.  30 tablet  0  . pravastatin (PRAVACHOL) 40 MG tablet Take 40 mg by mouth daily.       No current facility-administered medications for this visit.    OBJECTIVE: Middle-aged [place] American woman in no acute distress Filed Vitals:   08/16/12 1202  BP: 134/85  Pulse: 85  Temp: 98.2 F (36.8 C)  Resp: 20     Body mass index is 39.51 kg/(m^2).    ECOG FS: 0  Sclerae unicteric Oropharynx clear No cervical or supraclavicular adenopathy Lungs no rales or rhonchi Heart regular rate and rhythm Abd benign MSK no focal spinal tenderness, no peripheral edema Neuro: nonfocal, well oriented, pleasant affect Breasts: The right breast is unremarkable. The left breast is status post mastectomy with expander in place. There is no evidence of local recurrence. The left axilla is clear.  LAB RESULTS: Lab Results  Component Value Date   WBC 5.9 08/16/2012   NEUTROABS 2.9 08/16/2012   HGB 13.5 08/16/2012   HCT 40.3 08/16/2012   MCV 91.4 08/16/2012   PLT 270 08/16/2012      Chemistry      Component Value Date/Time   NA 140 08/16/2012 1055   NA 139 04/28/2012 0515   K 3.5 08/16/2012 1055   K 3.8 04/28/2012 0515   CL 101 08/16/2012 1055   CL 102 04/28/2012 0515   CO2 28 08/16/2012 1055   CO2 27 04/28/2012 0515   BUN 15.2 08/16/2012  1055   BUN 9 04/28/2012 0515   CREATININE 1.0 08/16/2012 1055   CREATININE 0.73 04/28/2012 0515      Component Value Date/Time   CALCIUM 10.0 08/16/2012 1055   CALCIUM 9.3 04/28/2012 0515   ALKPHOS 89 08/16/2012 1055   ALKPHOS 82 04/21/2012 1108   AST 15 08/16/2012 1055   AST 19 04/21/2012 1108   ALT 16 08/16/2012 1055   ALT 15 04/21/2012 1108   BILITOT 0.77 08/16/2012 1055   BILITOT 0.5 04/21/2012 1108       No results found for this basename: LABCA2    No components found with this basename: LABCA125    No results found for this basename: INR,  in the last 168 hours  Urinalysis    Component Value Date/Time  COLORURINE lt. yellow 05/26/2010 1051   APPEARANCEUR Clear 05/26/2010 1051   LABSPEC 1.015 05/26/2010 1051   PHURINE 5.0 05/26/2010 1051   HGBUR negative 05/26/2010 1051   BILIRUBINUR negative 05/26/2010 1051   UROBILINOGEN 0.2 05/26/2010 1051   NITRITE negative 05/26/2010 1051    STUDIES: No results found.  ASSESSMENT: 62 y.o. 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  PLAN: We spent the better part of today's 45 minute appointment going over her situation. She understands that although we did not document invasive disease in the breast, there must have been some, since she had one positive lymph node. This was a micrometastatic deposit. Accordingly HER-2 stages stage II, and she will benefit from systemic therapy.  We then discussed the difference between tamoxifen and the aromatase inhibitors. I think she may benefit from anastrozole, which if she tolerates it well she can continue for 5 years for optimal results. She has a good understanding of the possible toxicities, side effects, and complications.  Accordingly she is starting anastrozole this week. She is going to return to see [place] in  3 months to make sure she is tolerating it well, and if she is we will start seeing her on a every 6 month basis. We will also schedule a bone density before the return visit here. She knows to call for any problems that may develop before then.   MAGRINAT,GUSTAV C    08/16/2012

## 2012-08-18 ENCOUNTER — Other Ambulatory Visit: Payer: Self-pay | Admitting: Oncology

## 2012-08-18 NOTE — Progress Notes (Signed)
ADDENDUM: the CA in the LN was grade 2.  GM

## 2012-11-15 ENCOUNTER — Telehealth: Payer: Self-pay | Admitting: *Deleted

## 2012-11-15 ENCOUNTER — Ambulatory Visit (HOSPITAL_BASED_OUTPATIENT_CLINIC_OR_DEPARTMENT_OTHER): Payer: Medicare Other | Admitting: Physician Assistant

## 2012-11-15 ENCOUNTER — Other Ambulatory Visit (HOSPITAL_BASED_OUTPATIENT_CLINIC_OR_DEPARTMENT_OTHER): Payer: Medicare Other | Admitting: Lab

## 2012-11-15 ENCOUNTER — Encounter: Payer: Self-pay | Admitting: Physician Assistant

## 2012-11-15 VITALS — BP 151/85 | HR 93 | Temp 98.5°F | Resp 20 | Ht 60.0 in | Wt 199.6 lb

## 2012-11-15 DIAGNOSIS — C50912 Malignant neoplasm of unspecified site of left female breast: Secondary | ICD-10-CM

## 2012-11-15 DIAGNOSIS — Z780 Asymptomatic menopausal state: Secondary | ICD-10-CM | POA: Insufficient documentation

## 2012-11-15 DIAGNOSIS — Z9189 Other specified personal risk factors, not elsewhere classified: Secondary | ICD-10-CM

## 2012-11-15 DIAGNOSIS — Z1231 Encounter for screening mammogram for malignant neoplasm of breast: Secondary | ICD-10-CM

## 2012-11-15 DIAGNOSIS — C50919 Malignant neoplasm of unspecified site of unspecified female breast: Secondary | ICD-10-CM

## 2012-11-15 DIAGNOSIS — C50412 Malignant neoplasm of upper-outer quadrant of left female breast: Secondary | ICD-10-CM

## 2012-11-15 DIAGNOSIS — C50419 Malignant neoplasm of upper-outer quadrant of unspecified female breast: Secondary | ICD-10-CM

## 2012-11-15 DIAGNOSIS — C773 Secondary and unspecified malignant neoplasm of axilla and upper limb lymph nodes: Secondary | ICD-10-CM

## 2012-11-15 DIAGNOSIS — Z789 Other specified health status: Secondary | ICD-10-CM

## 2012-11-15 LAB — CBC WITH DIFFERENTIAL/PLATELET
BASO%: 0.4 % (ref 0.0–2.0)
Basophils Absolute: 0 10*3/uL (ref 0.0–0.1)
EOS%: 12.9 % — ABNORMAL HIGH (ref 0.0–7.0)
Eosinophils Absolute: 0.6 10*3/uL — ABNORMAL HIGH (ref 0.0–0.5)
HCT: 37 % (ref 34.8–46.6)
HGB: 12.6 g/dL (ref 11.6–15.9)
LYMPH%: 28.6 % (ref 14.0–49.7)
MCH: 31.9 pg (ref 25.1–34.0)
MCHC: 34.1 g/dL (ref 31.5–36.0)
MCV: 93.7 fL (ref 79.5–101.0)
MONO#: 0.8 10*3/uL (ref 0.1–0.9)
MONO%: 17.7 % — ABNORMAL HIGH (ref 0.0–14.0)
NEUT#: 1.7 10*3/uL (ref 1.5–6.5)
NEUT%: 40.4 % (ref 38.4–76.8)
Platelets: 200 10*3/uL (ref 145–400)
RBC: 3.95 10*6/uL (ref 3.70–5.45)
RDW: 12.9 % (ref 11.2–14.5)
WBC: 4.3 10*3/uL (ref 3.9–10.3)
lymph#: 1.2 10*3/uL (ref 0.9–3.3)

## 2012-11-15 LAB — COMPREHENSIVE METABOLIC PANEL (CC13)
ALT: 13 U/L (ref 0–55)
AST: 17 U/L (ref 5–34)
Albumin: 3.9 g/dL (ref 3.5–5.0)
Alkaline Phosphatase: 86 U/L (ref 40–150)
BUN: 13.2 mg/dL (ref 7.0–26.0)
CO2: 28 mEq/L (ref 22–29)
Calcium: 10.1 mg/dL (ref 8.4–10.4)
Chloride: 103 mEq/L (ref 98–109)
Creatinine: 1 mg/dL (ref 0.6–1.1)
Glucose: 131 mg/dl (ref 70–140)
Potassium: 3.6 mEq/L (ref 3.5–5.1)
Sodium: 141 mEq/L (ref 136–145)
Total Bilirubin: 0.54 mg/dL (ref 0.20–1.20)
Total Protein: 8.2 g/dL (ref 6.4–8.3)

## 2012-11-15 NOTE — Telephone Encounter (Signed)
appts made and printed...td 

## 2012-11-15 NOTE — Progress Notes (Signed)
ID: Sandy Morales   DOB: 1950-10-30  MR#: [id number]  WUJ#:[id number]  PCP: Quitman Livings MD GYN:  SU: Harriette Bouillon OTHER MD: Dr. Yisroel Ramming, Dr Lurline Hare, Etter Sjogren  HISTORY OF PRESENT ILLNESS: Ms. Sandy Morales had routine screening mammography at [facility] hospital 01/04/2012 showing heterogeneously dense breasts. In addition, in the left breast, new calcifications were noted. Left diagnostic mammography at the breast center 01/11/2012 confirmed amorphous calcifications in the left upper outer quadrant. Biopsy was performed 01/18/2012, and showed ([id number]) ductal carcinoma in situ, grade 3, strongly estrogen and progesterone receptor positive. The patient's subsequent history is as detailed below.  INTERVAL HISTORY: Sandy Morales returns together with her sister Claris Che for followup of Penn[place]'s left breast cancer. Following her visit here with Dr. Darnelle Catalan in late April, Sandy Morales started on her anastrozole in early May, and continues with very good tolerance. She tells me she has had no side effects whatsoever, and specifically, denies any increased hot flashes, vaginal dryness, or increased joint pain. She has some chronic left knee pain associated with arthritis, but this is not attributed to the anastrozole.   REVIEW OF SYSTEMS: Sandy Morales has had no recent illnesses and denies fevers or chills. She's had no rashes and denies any abnormal bruising or bleeding. Her energy level is fairly good, as is her appetite. She's had no nausea or change in bowel or bladder habits. She has an occasional cough, usually dry, and occasionally productive of clear phlegm. No hemoptysis. No pleurisy, shortness of breath, or chest pain. She denies abnormal headaches, dizziness, or change in vision. She has no additional myalgias, arthralgias, bony pain, or peripheral swelling.  A detailed review of systems is otherwise noncontributory.   PAST MEDICAL HISTORY: Past Medical History  Diagnosis Date  . Hypertension   .  GERD (gastroesophageal reflux disease)   . Breast cancer   . Dyslipidemia   . Osteoarthritis   . Gout     PAST SURGICAL HISTORY: Past Surgical History  Procedure Laterality Date  . Knee surgery    . Abdominal hysterectomy    . Cyst excision      rt middle finger  . Mastectomy  04/27/2012     left breast  with tissue expander  . Mastectomy w/ sentinel node biopsy  04/27/2012    Procedure: MASTECTOMY WITH SENTINEL LYMPH NODE BIOPSY;  Surgeon: Clovis Pu. Cornett, MD;  Location: [facility] OR;  Service: General;  Laterality: Left;  LEFT SIMPLE MASTECTOMY; SENTINEL LYMPH NODE BIOPSY  . Tissue expander placement  04/27/2012    Procedure: TISSUE EXPANDER;  Surgeon: Etter Sjogren, MD;  Location: [facility] OR;  Service: Plastics;  Laterality: Left;  PLACEMENT OF LEFT BREAST TISSUE EXPANDER    FAMILY HISTORY Family History  Problem Relation Age of Onset  . Lung cancer Mother   . Lung cancer Father    the patient's father died from lung cancer in the setting of tobacco abuse. The patient's mother died possibly from cancer as well. The patient is not quite sure of the age of death of either of her parents. Ms. Waldron had 3 brothers and 7 sisters. She is not aware of any breast or ovarian cancer history in the family.  GYNECOLOGIC HISTORY: First menstrual period age 74. First live birth age 55. She is GX P3. She underwent TAH-BSO in 1995. She never used hormone replacement.  SOCIAL HISTORY: (updated OCT 2013) Sandy Morales worked for a [employer] for 32 years. 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  She has been widowed for approximately 11 years. She lives by herself. Her children are out Glean Salen, who is currently incarcerated; Valia Wingard, who lives in [place] and works in [employer]; and Elnoria Livingston, who is disabled secondary to neuro fibromatosis. The patient has 16 grandchildren and 2 great-grandchildren. She attends a [employer], adult church.   ADVANCED  DIRECTIVES: Not in place; the patient tells me she has talked with their sons Timthony and Aurther Loft and that they would make decisions together if she became mantle incapacitated.  HEALTH MAINTENANCE: History  Substance Use Topics  . Smoking status: Never Smoker   . Smokeless tobacco: Never Used     Comment: last marijunia 04/15/12  . Alcohol Use: No     Comment: occasonal     Colonoscopy: 2011  PAP: s/p hyst  Bone density: never  Lipid panel:  No Known Allergies  Current Outpatient Prescriptions  Medication Sig Dispense Refill  . allopurinol (ZYLOPRIM) 100 MG tablet       . anastrozole (ARIMIDEX) 1 MG tablet Take 1 tablet (1 mg total) by mouth daily.  30 tablet  12  . aspirin 81 MG tablet Take 81 mg by mouth daily.      . benazepril-hydrochlorthiazide (LOTENSIN HCT) 10-12.5 MG per tablet Take 1 tablet by mouth daily.      . famotidine (PEPCID) 20 MG tablet Take 20 mg by mouth 2 (two) times daily.      . pravastatin (PRAVACHOL) 40 MG tablet Take 40 mg by mouth daily.      . traMADol (ULTRAM) 50 MG tablet       . colchicine 0.6 MG tablet Take 2 now and 1 in 1 hour.  May repeat dose once daily.  For gout attack.  12 tablet  2  . diclofenac (VOLTAREN) 75 MG EC tablet       . methocarbamol (ROBAXIN) 500 MG tablet Take 1 tablet (500 mg total) by mouth 3 (three) times daily.  30 tablet  0  . oxyCODONE-acetaminophen (PERCOCET/ROXICET) 5-325 MG per tablet Take 1-2 tablets by mouth every 4 (four) hours as needed.  30 tablet  0   No current facility-administered medications for this visit.    OBJECTIVE: Middle-aged [place] American woman in no acute distress Filed Vitals:   11/15/12 1101  BP: 151/85  Pulse: 93  Temp: 98.5 F (36.9 C)  Resp: 20     Body mass index is 38.98 kg/(m^2).    ECOG FS: 0 Filed Weights   11/15/12 1101  Weight: 199 lb 9.6 oz (90.538 kg)   Sclerae unicteric Oropharynx clear No cervical or supraclavicular adenopathy Lungs clear to auscultation bilaterally,  no rales or rhonchi Heart regular rate and rhythm, no murmur appreciated Abd soft, nontender to palpation, positive bowel sounds MSK no focal spinal tenderness, no peripheral edema Neuro: nonfocal, well oriented, pleasant affect Breasts: The right breast is unremarkable. The left breast is status post mastectomy with expander in place. There is no evidence of local recurrence. Axillae are benign bilaterally with no palpable adenopathy palpated.  LAB RESULTS: Lab Results  Component Value Date   WBC 4.3 11/15/2012   NEUTROABS 1.7 11/15/2012   HGB 12.6 11/15/2012   HCT 37.0 11/15/2012   MCV 93.7 11/15/2012   PLT 200 11/15/2012      Chemistry      Component Value Date/Time   NA 141 11/15/2012 1047   NA 139 04/28/2012 0515   K 3.6 11/15/2012 1047   K 3.8 04/28/2012 0515  CL 101 08/16/2012 1055   CL 102 04/28/2012 0515   CO2 28 11/15/2012 1047   CO2 27 04/28/2012 0515   BUN 13.2 11/15/2012 1047   BUN 9 04/28/2012 0515   CREATININE 1.0 11/15/2012 1047   CREATININE 0.73 04/28/2012 0515      Component Value Date/Time   CALCIUM 10.1 11/15/2012 1047   CALCIUM 9.3 04/28/2012 0515   ALKPHOS 86 11/15/2012 1047   ALKPHOS 82 04/21/2012 1108   AST 17 11/15/2012 1047   AST 19 04/21/2012 1108   ALT 13 11/15/2012 1047   ALT 15 04/21/2012 1108   BILITOT 0.54 11/15/2012 1047   BILITOT 0.5 04/21/2012 1108        STUDIES: No results found.  ASSESSMENT: 62 y.o. [place] woman   (1)  status post left mastectomy and sentinel lymph node sampling 04/27/2012 for a pTis pN1, stage IIA invasive ductal carcinoma, estrogen and progesterone receptors both 100% positive, with an MIB-1 of 36%, and no HER-2 amplification.  (2)  started on anastrozole in early May 2014, the plan being to continue for 5 years.    PLAN:  Sandy Morales continues to tolerate the anastrozole very well, and the plan will be to continue for total of 5 years. She is due for her next right screening mammogram in September, and we will obtain a bone density  test at the same time. I will see her in 3 months to review those results and again assess her tolerance of the anastrozole. If she is doing well at that point, we will likely begin seeing her on a q. 6 month basis.   Sandy Morales voices understanding and agreement with this plan, and will call with any changes or problems.   Jovita Persing    11/15/2012

## 2012-12-16 ENCOUNTER — Emergency Department (HOSPITAL_COMMUNITY): Payer: Medicare Other | Admitting: Anesthesiology

## 2012-12-16 ENCOUNTER — Encounter (HOSPITAL_COMMUNITY): Payer: Self-pay | Admitting: Anesthesiology

## 2012-12-16 ENCOUNTER — Encounter (HOSPITAL_COMMUNITY): Admission: EM | Disposition: A | Discharge status: Home / Self Care | Payer: Self-pay | Source: Home / Self Care

## 2012-12-16 ENCOUNTER — Other Ambulatory Visit (HOSPITAL_COMMUNITY): Payer: Self-pay | Admitting: Plastic Surgery

## 2012-12-16 ENCOUNTER — Emergency Department (HOSPITAL_COMMUNITY): Payer: Medicare Other

## 2012-12-16 ENCOUNTER — Encounter (HOSPITAL_COMMUNITY): Payer: Self-pay | Admitting: Family Medicine

## 2012-12-16 ENCOUNTER — Inpatient Hospital Stay (HOSPITAL_COMMUNITY)
Admission: EM | Admit: 2012-12-16 | Discharge: 2012-12-18 | DRG: 909 | Disposition: A | Discharge status: Home / Self Care | Payer: Medicare Other | Attending: Emergency Medicine | Admitting: Emergency Medicine

## 2012-12-16 DIAGNOSIS — S20212A Contusion of left front wall of thorax, initial encounter: Secondary | ICD-10-CM

## 2012-12-16 DIAGNOSIS — F1210 Cannabis abuse, uncomplicated: Secondary | ICD-10-CM | POA: Diagnosis present

## 2012-12-16 DIAGNOSIS — M62838 Other muscle spasm: Secondary | ICD-10-CM

## 2012-12-16 DIAGNOSIS — M109 Gout, unspecified: Secondary | ICD-10-CM | POA: Diagnosis present

## 2012-12-16 DIAGNOSIS — T85898A Other specified complication of other internal prosthetic devices, implants and grafts, initial encounter: Principal | ICD-10-CM | POA: Diagnosis present

## 2012-12-16 DIAGNOSIS — N6489 Other specified disorders of breast: Secondary | ICD-10-CM | POA: Diagnosis present

## 2012-12-16 DIAGNOSIS — IMO0002 Reserved for concepts with insufficient information to code with codable children: Secondary | ICD-10-CM

## 2012-12-16 DIAGNOSIS — E872 Acidosis: Secondary | ICD-10-CM

## 2012-12-16 DIAGNOSIS — R579 Shock, unspecified: Secondary | ICD-10-CM

## 2012-12-16 DIAGNOSIS — E876 Hypokalemia: Secondary | ICD-10-CM

## 2012-12-16 DIAGNOSIS — C50412 Malignant neoplasm of upper-outer quadrant of left female breast: Secondary | ICD-10-CM

## 2012-12-16 DIAGNOSIS — K219 Gastro-esophageal reflux disease without esophagitis: Secondary | ICD-10-CM | POA: Diagnosis present

## 2012-12-16 DIAGNOSIS — I10 Essential (primary) hypertension: Secondary | ICD-10-CM

## 2012-12-16 DIAGNOSIS — K3189 Other diseases of stomach and duodenum: Secondary | ICD-10-CM

## 2012-12-16 DIAGNOSIS — R55 Syncope and collapse: Secondary | ICD-10-CM

## 2012-12-16 DIAGNOSIS — Z780 Asymptomatic menopausal state: Secondary | ICD-10-CM

## 2012-12-16 DIAGNOSIS — M1990 Unspecified osteoarthritis, unspecified site: Secondary | ICD-10-CM | POA: Diagnosis present

## 2012-12-16 DIAGNOSIS — Z853 Personal history of malignant neoplasm of breast: Secondary | ICD-10-CM

## 2012-12-16 DIAGNOSIS — E785 Hyperlipidemia, unspecified: Secondary | ICD-10-CM

## 2012-12-16 DIAGNOSIS — M25512 Pain in left shoulder: Secondary | ICD-10-CM

## 2012-12-16 HISTORY — PX: INCISION AND DRAINAGE OF WOUND: SHX1803

## 2012-12-16 LAB — COMPREHENSIVE METABOLIC PANEL
ALT: 18 U/L (ref 0–35)
AST: 18 U/L (ref 0–37)
Albumin: 3.7 g/dL (ref 3.5–5.2)
Alkaline Phosphatase: 74 U/L (ref 39–117)
BUN: 18 mg/dL (ref 6–23)
CO2: 23 mEq/L (ref 19–32)
Calcium: 9.7 mg/dL (ref 8.4–10.5)
Chloride: 101 mEq/L (ref 96–112)
Creatinine, Ser: 0.96 mg/dL (ref 0.50–1.10)
GFR calc Af Amer: 71 mL/min — ABNORMAL LOW (ref >90)
GFR calc non Af Amer: 62 mL/min — ABNORMAL LOW (ref >90)
Glucose, Bld: 200 mg/dL — ABNORMAL HIGH (ref 70–99)
Potassium: 3.1 mEq/L — ABNORMAL LOW (ref 3.5–5.1)
Sodium: 141 mEq/L (ref 135–145)
Total Bilirubin: 0.6 mg/dL (ref 0.3–1.2)
Total Protein: 7.7 g/dL (ref 6.0–8.3)

## 2012-12-16 LAB — GLUCOSE, CAPILLARY
Glucose-Capillary: 227 mg/dL — ABNORMAL HIGH (ref 70–99)
Glucose-Capillary: 145 mg/dL — ABNORMAL HIGH (ref 70–99)
Glucose-Capillary: 105 mg/dL — ABNORMAL HIGH (ref 70–99)

## 2012-12-16 LAB — ABO/RH: ABO/RH(D): O POS

## 2012-12-16 LAB — CBC
HCT: 24.5 % — ABNORMAL LOW (ref 36.0–46.0)
Hemoglobin: 8.4 g/dL — ABNORMAL LOW (ref 12.0–15.0)
MCH: 31.1 pg (ref 26.0–34.0)
MCHC: 34.3 g/dL (ref 30.0–36.0)
MCV: 90.7 fL (ref 78.0–100.0)
Platelets: 215 10*3/uL (ref 150–400)
RBC: 2.7 MIL/uL — ABNORMAL LOW (ref 3.87–5.11)
RDW: 12.5 % (ref 11.5–15.5)
WBC: 13.2 10*3/uL — ABNORMAL HIGH (ref 4.0–10.5)

## 2012-12-16 LAB — CBC WITH DIFFERENTIAL/PLATELET
Basophils Absolute: 0 10*3/uL (ref 0.0–0.1)
Basophils Relative: 0 % (ref 0–1)
Eosinophils Absolute: 0.6 10*3/uL (ref 0.0–0.7)
Eosinophils Relative: 7 % — ABNORMAL HIGH (ref 0–5)
HCT: 32.7 % — ABNORMAL LOW (ref 36.0–46.0)
Hemoglobin: 11.3 g/dL — ABNORMAL LOW (ref 12.0–15.0)
Lymphocytes Relative: 41 % (ref 12–46)
Lymphs Abs: 3.3 10*3/uL (ref 0.7–4.0)
MCH: 31.6 pg (ref 26.0–34.0)
MCHC: 34.6 g/dL (ref 30.0–36.0)
MCV: 91.3 fL (ref 78.0–100.0)
Monocytes Absolute: 0.4 10*3/uL (ref 0.1–1.0)
Monocytes Relative: 5 % (ref 3–12)
Neutro Abs: 3.9 10*3/uL (ref 1.7–7.7)
Neutrophils Relative %: 47 % (ref 43–77)
Platelets: 287 10*3/uL (ref 150–400)
RBC: 3.58 MIL/uL — ABNORMAL LOW (ref 3.87–5.11)
RDW: 12.4 % (ref 11.5–15.5)
WBC: 8.2 10*3/uL (ref 4.0–10.5)

## 2012-12-16 LAB — URINALYSIS, ROUTINE W REFLEX MICROSCOPIC
Bilirubin Urine: NEGATIVE
Glucose, UA: NEGATIVE mg/dL
Hgb urine dipstick: NEGATIVE
Ketones, ur: NEGATIVE mg/dL
Leukocytes, UA: NEGATIVE
Nitrite: NEGATIVE
Protein, ur: NEGATIVE mg/dL
Specific Gravity, Urine: 1.016 (ref 1.005–1.030)
Urobilinogen, UA: 0.2 mg/dL (ref 0.0–1.0)
pH: 6.5 (ref 5.0–8.0)

## 2012-12-16 LAB — HEMOGLOBIN A1C
Hgb A1c MFr Bld: 5.5 % (ref <5.7)
Mean Plasma Glucose: 111 mg/dL (ref <117)

## 2012-12-16 LAB — PROTIME-INR
INR: 1.04 (ref 0.00–1.49)
Prothrombin Time: 13.4 seconds (ref 11.6–15.2)

## 2012-12-16 LAB — PREPARE RBC (CROSSMATCH)

## 2012-12-16 LAB — CG4 I-STAT (LACTIC ACID): Lactic Acid, Venous: 4.26 mmol/L — ABNORMAL HIGH (ref 0.5–2.2)

## 2012-12-16 LAB — POCT I-STAT 4, (NA,K, GLUC, HGB,HCT)
Glucose, Bld: 123 mg/dL — ABNORMAL HIGH (ref 70–99)
HCT: 21 % — ABNORMAL LOW (ref 36.0–46.0)
Hemoglobin: 7.1 g/dL — ABNORMAL LOW (ref 12.0–15.0)
Potassium: 4.3 mEq/L (ref 3.5–5.1)
Sodium: 142 mEq/L (ref 135–145)

## 2012-12-16 LAB — LACTIC ACID, PLASMA: Lactic Acid, Venous: 4.6 mmol/L — ABNORMAL HIGH (ref 0.5–2.2)

## 2012-12-16 SURGERY — IRRIGATION AND DEBRIDEMENT WOUND
Anesthesia: General | Site: Breast | Laterality: Left | Wound class: Clean

## 2012-12-16 MED ORDER — IOHEXOL 300 MG/ML  SOLN
80.0000 mL | Freq: Once | INTRAMUSCULAR | Status: AC | PRN
  Administered 2012-12-16: 80 mL via INTRAVENOUS

## 2012-12-16 MED ORDER — OXYCODONE HCL 5 MG/5ML PO SOLN: 5.0000 mg | Freq: Once | ORAL | Status: DC | PRN

## 2012-12-16 MED ORDER — ANASTROZOLE 1 MG PO TABS
1.0000 mg | ORAL_TABLET | Freq: Every day | ORAL | Status: DC
  Administered 2012-12-16 – 2012-12-18 (×3): 1 mg via ORAL
  Filled 2012-12-16 (×3): qty 1

## 2012-12-16 MED ORDER — ONDANSETRON HCL 4 MG/2ML IJ SOLN: 4.0000 mg | Freq: Four times a day (QID) | INTRAMUSCULAR | Status: DC | PRN

## 2012-12-16 MED ORDER — FENTANYL CITRATE 0.05 MG/ML IJ SOLN
INTRAMUSCULAR | Status: DC | PRN
  Administered 2012-12-16: 25 ug via INTRAVENOUS
  Administered 2012-12-16: 100 ug via INTRAVENOUS
  Administered 2012-12-16: 25 ug via INTRAVENOUS
  Administered 2012-12-16 (×2): 50 ug via INTRAVENOUS

## 2012-12-16 MED ORDER — BENAZEPRIL-HYDROCHLOROTHIAZIDE 10-12.5 MG PO TABS
1.0000 | ORAL_TABLET | Freq: Every day | ORAL | Status: DC
Start: 2012-12-16 — End: 2012-12-16

## 2012-12-16 MED ORDER — SODIUM CHLORIDE 0.45 % IV SOLN
INTRAVENOUS | Status: DC
  Administered 2012-12-16 – 2012-12-17 (×2): via INTRAVENOUS

## 2012-12-16 MED ORDER — INSULIN ASPART 100 UNIT/ML ~~LOC~~ SOLN
6.0000 [IU] | Freq: Three times a day (TID) | SUBCUTANEOUS | Status: DC
  Administered 2012-12-17 – 2012-12-18 (×3): 6 [IU] via SUBCUTANEOUS

## 2012-12-16 MED ORDER — MIDAZOLAM HCL 5 MG/5ML IJ SOLN
INTRAMUSCULAR | Status: DC | PRN
  Administered 2012-12-16: 2 mg via INTRAVENOUS

## 2012-12-16 MED ORDER — SODIUM CHLORIDE 0.9 % IR SOLN
Status: DC | PRN
  Administered 2012-12-16 (×2)

## 2012-12-16 MED ORDER — LIDOCAINE HCL (CARDIAC) 20 MG/ML IV SOLN
INTRAVENOUS | Status: DC | PRN
  Administered 2012-12-16: 50 mg via INTRAVENOUS

## 2012-12-16 MED ORDER — BENAZEPRIL HCL 10 MG PO TABS
10.0000 mg | ORAL_TABLET | Freq: Every day | ORAL | Status: DC
  Administered 2012-12-16 – 2012-12-17 (×2): 10 mg via ORAL
  Filled 2012-12-16 (×3): qty 1

## 2012-12-16 MED ORDER — HYDROCHLOROTHIAZIDE 12.5 MG PO CAPS
12.5000 mg | ORAL_CAPSULE | Freq: Every day | ORAL | Status: DC
  Administered 2012-12-16 – 2012-12-17 (×2): 12.5 mg via ORAL
  Filled 2012-12-16 (×3): qty 1

## 2012-12-16 MED ORDER — PROPOFOL 10 MG/ML IV BOLUS
INTRAVENOUS | Status: DC | PRN
  Administered 2012-12-16: 200 mg via INTRAVENOUS
  Administered 2012-12-16: 40 mg via INTRAVENOUS

## 2012-12-16 MED ORDER — DOCUSATE SODIUM 100 MG PO CAPS
100.0000 mg | ORAL_CAPSULE | Freq: Every day | ORAL | Status: DC
  Administered 2012-12-16 – 2012-12-18 (×3): 100 mg via ORAL
  Filled 2012-12-16 (×3): qty 1

## 2012-12-16 MED ORDER — PHENYLEPHRINE HCL 10 MG/ML IJ SOLN
10.0000 mg | INTRAVENOUS | Status: DC | PRN
  Administered 2012-12-16: 40 ug/min via INTRAVENOUS

## 2012-12-16 MED ORDER — ALLOPURINOL 100 MG PO TABS
100.0000 mg | ORAL_TABLET | Freq: Every day | ORAL | Status: DC
Start: 2012-12-16 — End: 2012-12-18
  Administered 2012-12-16 – 2012-12-18 (×3): 100 mg via ORAL
  Filled 2012-12-16 (×3): qty 1

## 2012-12-16 MED ORDER — INSULIN ASPART 100 UNIT/ML ~~LOC~~ SOLN
0.0000 [IU] | Freq: Three times a day (TID) | SUBCUTANEOUS | Status: DC
  Administered 2012-12-16 – 2012-12-17 (×2): 3 [IU] via SUBCUTANEOUS

## 2012-12-16 MED ORDER — SODIUM CHLORIDE 0.9 % IV SOLN
INTRAVENOUS | Status: DC | PRN
  Administered 2012-12-16: 10:00:00 via INTRAVENOUS

## 2012-12-16 MED ORDER — METHOCARBAMOL 500 MG PO TABS
500.0000 mg | ORAL_TABLET | Freq: Four times a day (QID) | ORAL | Status: DC | PRN
  Administered 2012-12-16 – 2012-12-18 (×2): 500 mg via ORAL
  Filled 2012-12-16 (×2): qty 1

## 2012-12-16 MED ORDER — INSULIN ASPART 100 UNIT/ML ~~LOC~~ SOLN: 0.0000 [IU] | Freq: Every day | SUBCUTANEOUS | Status: DC

## 2012-12-16 MED ORDER — OXYCODONE-ACETAMINOPHEN 5-325 MG PO TABS
1.0000 | ORAL_TABLET | ORAL | Status: DC | PRN
  Administered 2012-12-16: 1 via ORAL
  Administered 2012-12-16 – 2012-12-18 (×7): 2 via ORAL
  Filled 2012-12-16 (×5): qty 2
  Filled 2012-12-16: qty 1
  Filled 2012-12-16 (×2): qty 2

## 2012-12-16 MED ORDER — SODIUM CHLORIDE 0.9 % IV BOLUS (SEPSIS)
1000.0000 mL | Freq: Once | INTRAVENOUS | Status: AC
  Administered 2012-12-16: 1000 mL via INTRAVENOUS

## 2012-12-16 MED ORDER — HYDROMORPHONE HCL PF 1 MG/ML IJ SOLN
0.2500 mg | INTRAMUSCULAR | Status: DC | PRN
  Administered 2012-12-16 (×4): 0.5 mg via INTRAVENOUS

## 2012-12-16 MED ORDER — HYDROMORPHONE HCL PF 1 MG/ML IJ SOLN
INTRAMUSCULAR | Status: AC
  Filled 2012-12-16: qty 1

## 2012-12-16 MED ORDER — DEXTROSE-NACL 5-0.45 % IV SOLN: INTRAVENOUS | Status: DC

## 2012-12-16 MED ORDER — POTASSIUM CHLORIDE 10 MEQ/100ML IV SOLN
10.0000 meq | INTRAVENOUS | Status: AC
  Administered 2012-12-16: 10 meq via INTRAVENOUS
  Filled 2012-12-16: qty 100

## 2012-12-16 MED ORDER — VANCOMYCIN HCL 10 G IV SOLR
1500.0000 mg | INTRAVENOUS | Status: DC
  Administered 2012-12-16 – 2012-12-17 (×2): 1500 mg via INTRAVENOUS
  Filled 2012-12-16 (×3): qty 1500

## 2012-12-16 MED ORDER — FAMOTIDINE 20 MG PO TABS
20.0000 mg | ORAL_TABLET | Freq: Two times a day (BID) | ORAL | Status: DC
  Administered 2012-12-16 – 2012-12-18 (×5): 20 mg via ORAL
  Filled 2012-12-16 (×7): qty 1

## 2012-12-16 MED ORDER — 0.9 % SODIUM CHLORIDE (POUR BTL) OPTIME
TOPICAL | Status: DC | PRN
  Administered 2012-12-16 (×2): 1000 mL

## 2012-12-16 MED ORDER — ROCURONIUM BROMIDE 100 MG/10ML IV SOLN
INTRAVENOUS | Status: DC | PRN
  Administered 2012-12-16: 40 mg via INTRAVENOUS

## 2012-12-16 MED ORDER — OXYCODONE HCL 5 MG PO TABS
5.0000 mg | ORAL_TABLET | Freq: Once | ORAL | Status: DC | PRN
Start: 2012-12-16 — End: 2012-12-16

## 2012-12-16 MED ORDER — SIMVASTATIN 40 MG PO TABS
40.0000 mg | ORAL_TABLET | Freq: Every day | ORAL | Status: DC
  Administered 2012-12-16 – 2012-12-17 (×2): 40 mg via ORAL
  Filled 2012-12-16 (×3): qty 1

## 2012-12-16 SURGICAL SUPPLY — 40 items
ATCH SMKEVC FLXB CAUT HNDSWH (FILTER) ×2 IMPLANT
BANDAGE ELASTIC 6 VELCRO ST LF (GAUZE/BANDAGES/DRESSINGS) IMPLANT
BINDER BREAST XLRG (GAUZE/BANDAGES/DRESSINGS) ×3 IMPLANT
BIOPATCH RED 1 DISK 7.0 (GAUZE/BANDAGES/DRESSINGS) ×6 IMPLANT
CANISTER SUCTION 2500CC (MISCELLANEOUS) ×3 IMPLANT
CHLORAPREP W/TINT 26ML (MISCELLANEOUS) ×3 IMPLANT
CLOTH BEACON ORANGE TIMEOUT ST (SAFETY) ×3 IMPLANT
COVER SURGICAL LIGHT HANDLE (MISCELLANEOUS) ×3 IMPLANT
DRAIN CHANNEL 19F RND (DRAIN) ×6 IMPLANT
DRAPE LAPAROSCOPIC ABDOMINAL (DRAPES) ×3 IMPLANT
DRAPE SURG 17X23 STRL (DRAPES) ×12 IMPLANT
DRESSING TELFA 8X3 (GAUZE/BANDAGES/DRESSINGS) ×3 IMPLANT
DRSG PAD ABDOMINAL 8X10 ST (GAUZE/BANDAGES/DRESSINGS) ×9 IMPLANT
ELECT REM PT RETURN 9FT ADLT (ELECTROSURGICAL) ×3
ELECTRODE REM PT RTRN 9FT ADLT (ELECTROSURGICAL) ×2 IMPLANT
EVACUATOR SILICONE 100CC (DRAIN) ×6 IMPLANT
EVACUATOR SMOKE ACCUVAC VALLEY (FILTER) ×1
GLOVE BIO SURGEON STRL SZ7.5 (GLOVE) ×3 IMPLANT
GLOVE BIOGEL PI IND STRL 8 (GLOVE) ×2 IMPLANT
GLOVE BIOGEL PI INDICATOR 8 (GLOVE) ×1
GOWN STRL NON-REIN LRG LVL3 (GOWN DISPOSABLE) ×6 IMPLANT
GOWN STRL REIN XL XLG (GOWN DISPOSABLE) ×3 IMPLANT
KIT BASIN OR (CUSTOM PROCEDURE TRAY) ×3 IMPLANT
KIT ROOM TURNOVER OR (KITS) ×3 IMPLANT
MARKER SKIN DUAL TIP RULER LAB (MISCELLANEOUS) ×3 IMPLANT
NS IRRIG 1000ML POUR BTL (IV SOLUTION) ×6 IMPLANT
PACK GENERAL/GYN (CUSTOM PROCEDURE TRAY) ×3 IMPLANT
PAD ARMBOARD 7.5X6 YLW CONV (MISCELLANEOUS) ×6 IMPLANT
PREFILTER EVAC NS 1 1/3-3/8IN (MISCELLANEOUS) ×3 IMPLANT
SPONGE GAUZE 4X4 12PLY (GAUZE/BANDAGES/DRESSINGS) IMPLANT
STAPLER VISISTAT 35W (STAPLE) ×3 IMPLANT
SUT MNCRL AB 3-0 PS2 18 (SUTURE) ×6 IMPLANT
SUT PROLENE 3 0 PS 2 (SUTURE) ×9 IMPLANT
SUT SILK 3 0 SH 30 (SUTURE) ×3 IMPLANT
SUT VIC AB 3-0 SH 18 (SUTURE) ×3 IMPLANT
SUT VIC AB 3-0 SH 27 (SUTURE) ×1
SUT VIC AB 3-0 SH 27X BRD (SUTURE) ×2 IMPLANT
TOWEL OR 17X24 6PK STRL BLUE (TOWEL DISPOSABLE) ×3 IMPLANT
TOWEL OR 17X26 10 PK STRL BLUE (TOWEL DISPOSABLE) ×3 IMPLANT
WATER STERILE IRR 1000ML POUR (IV SOLUTION) IMPLANT

## 2012-12-16 NOTE — Anesthesia Procedure Notes (Signed)
Procedure Name: Intubation Date/Time: 12/16/2012 10:29 AM Performed by: Alanda Amass A Pre-anesthesia Checklist: Patient identified, Timeout performed, Emergency Drugs available, Suction available and Patient being monitored Patient Re-evaluated:Patient Re-evaluated prior to inductionOxygen Delivery Method: Circle system utilized Preoxygenation: Pre-oxygenation with 100% oxygen Intubation Type: IV induction Ventilation: Mask ventilation without difficulty Laryngoscope Size: Mac and 3 Grade View: Grade I Tube type: Oral Tube size: 7.5 mm Number of attempts: 1 Airway Equipment and Method: Stylet Placement Confirmation: ETT inserted through vocal cords under direct vision,  breath sounds checked- equal and bilateral and positive ETCO2 Secured at: 21 cm Tube secured with: Tape Dental Injury: Teeth and Oropharynx as per pre-operative assessment

## 2012-12-16 NOTE — ED Provider Notes (Signed)
CSN: [id number]     Arrival date & time 12/16/12  0526 History   First MD Initiated Contact with Patient 12/16/12 0542     Chief Complaint  Patient presents with  . Loss of Consciousness  . Chest Pain   (Consider location/radiation/quality/duration/timing/severity/associated sxs/prior Treatment) HPI This patient is a 62 year old woman with a history of breast cancer, s/p left modified mastectomy who is approximately 11 days status post tissue expander placement to the left breast by Dr. Odis Luster. She presents today via EMS with complaints of swelling of the region where her expander was placed as well as the upper left chest.  The patient says she got out of bed and was walking to the bathroom when she felt pain in this region. The pain was very short lived but followed promptly by rapid swelling of this area. She thus called EMS. Paramedics report that the patient had a brief syncopal episode v. seizure at the scene. This occurred mostly with sitting in a chair.  The patient denies chest pain at this time. She denies history of seizure. No fever. She is quite diaphoretic and feels cold all over. The patient is not taking any antiplatelet or anticoagulation medication.   Past Medical History  Diagnosis Date  . Hypertension   . GERD (gastroesophageal reflux disease)   . Breast cancer   . Dyslipidemia   . Osteoarthritis   . Gout    Past Surgical History  Procedure Laterality Date  . Knee surgery    . Abdominal hysterectomy    . Cyst excision      rt middle finger  . Mastectomy  04/27/2012     left breast  with tissue expander  . Mastectomy w/ sentinel node biopsy  04/27/2012    Procedure: MASTECTOMY WITH SENTINEL LYMPH NODE BIOPSY;  Surgeon: Clovis Pu. Cornett, MD;  Location: [facility] OR;  Service: General;  Laterality: Left;  LEFT SIMPLE MASTECTOMY; SENTINEL LYMPH NODE BIOPSY  . Tissue expander placement  04/27/2012    Procedure: TISSUE EXPANDER;  Surgeon: Etter Sjogren, MD;  Location: [facility] OR;   Service: Plastics;  Laterality: Left;  PLACEMENT OF LEFT BREAST TISSUE EXPANDER   Family History  Problem Relation Age of Onset  . Lung cancer Mother   . Lung cancer Father    History  Substance Use Topics  . Smoking status: Never Smoker   . Smokeless tobacco: Never Used     Comment: last marijunia 04/15/12  . Alcohol Use: No     Comment: occasonal   OB History   Grav Para Term Preterm Abortions TAB SAB Ect Mult Living                 Review of Systems Gen: As per history of present illness, otherwise negative Eyes: no discharge or drainage, no occular pain or visual changes Nose: no epistaxis or rhinorrhea Mouth: no dental pain, no sore throat Neck: no neck pain Lungs: no SOB, cough, wheezing] Chest wall: as per hpi, otherwise negative CV: no chest pain, palpitations, dependent edema or orthopnea Abd: no abdominal pain, nausea, vomiting GU: no dysuria or gross hematuria MSK: no myalgias or arthralgias Neuro: no headache, no focal neurologic deficits Skin: diapohetic Psyche: negative.  Allergies  Review of patient's allergies indicates no known allergies.  Home Medications   Current Outpatient Rx  Name  Route  Sig  Dispense  Refill  . allopurinol (ZYLOPRIM) 100 MG tablet               .  anastrozole (ARIMIDEX) 1 MG tablet   Oral   Take 1 tablet (1 mg total) by mouth daily.   30 tablet   12   . aspirin 81 MG tablet   Oral   Take 81 mg by mouth daily.         . benazepril-hydrochlorthiazide (LOTENSIN HCT) 10-12.5 MG per tablet   Oral   Take 1 tablet by mouth daily.         Marland Kitchen EXPIRED: colchicine 0.6 MG tablet      Take 2 now and 1 in 1 hour.  May repeat dose once daily.  For gout attack.   12 tablet   2   . diclofenac (VOLTAREN) 75 MG EC tablet               . famotidine (PEPCID) 20 MG tablet   Oral   Take 20 mg by mouth 2 (two) times daily.         . methocarbamol (ROBAXIN) 500 MG tablet   Oral   Take 1 tablet (500 mg total) by mouth  3 (three) times daily.   30 tablet   0   . oxyCODONE-acetaminophen (PERCOCET/ROXICET) 5-325 MG per tablet   Oral   Take 1-2 tablets by mouth every 4 (four) hours as needed.   30 tablet   0   . pravastatin (PRAVACHOL) 40 MG tablet   Oral   Take 40 mg by mouth daily.         . traMADol (ULTRAM) 50 MG tablet                BP 96/67  Pulse 120  Temp(Src) 97.8 F (36.6 C) (Oral)  Resp 16  Ht 5' (1.524 m)  Wt 193 lb (87.544 kg)  BMI 37.69 kg/m2  SpO2 100% Physical Exam Gen: well developed and well nourished appearing, ill appearing Head: NCAT Eyes: PERL, EOMI Nose: no epistaixis or rhinorrhea Mouth/throat: mucosa is moist and pink Neck: supple, no stridor Lungs: CTA B, no wheezing, rhonchi or rales, RR 28 to 32/min Chest wall: well healed incision over the left anterior chest wall, significant swelling in this region, tissue is tense, no pain, no erythema, region of swelling expands to the clavicle. CV: rapid and regular, no murmur, palpable peripheral pulses Abd: soft, notender, obese, nondistended Back: no ttp, no cva ttp Skin: cool and diaphoretic.  Neuro: CN ii-xii grossly intact, no focal deficits Psyche; normal affect,  calm and cooperative.    ED Course  Procedures (including critical care time)  Results for orders placed during the hospital encounter of 12/16/12 (from the past 24 hour(s))  GLUCOSE, CAPILLARY     Status: Abnormal   Collection Time    12/16/12  5:42 AM      Result Value Range   Glucose-Capillary 227 (*) 70 - 99 mg/dL  CG4 I-STAT (LACTIC ACID)     Status: Abnormal   Collection Time    12/16/12  6:35 AM      Result Value Range   Lactic Acid, Venous 4.26 (*) 0.5 - 2.2 mmol/L   EKG: Sinus tachycardia, rate 113, no acute ischemic changes, normal intervals, normal axis, normal qrs complex  MDM  ED course: About 15-20 minutes after the patient arrived to the emergency department was notified by the patient's nurse that the patient had a  syncopal episode while in bed. She appeared to have some decorticate posturing. This episode lasted seconds and then the patient regained normal level of consciousness. The  patient was noted to be extremely diapohretic at this time. BP checked and 79/40s.   I immediately went to re-evaluate the patient. She was concsious and appropriately alert but diaphoretic with cool skin and appeared to be in shock. Re-evaluation of the left chest revealed expansion of suspected hematoma with increased firmness and tension in the region. [facility]SU on call was paged and asked to come to the patient's room for consultation.  I placed an additional IV in the patient right EJ and we have been resuscitating with crystalloid. The patient is typed and crossed for 2 units. We are awaiting lab results. Lactic acid is elevated at 4.26.   Dr. Harlon Flor has discussed the patient's presentation with Dr. Maxcine Ham and he en route to evaluate the patient.   CRITICAL CARE Performed by: Brandt Loosen   Total critical care time: 41m  Critical care time was exclusive of separately billable procedures and treating other patients.  Critical care was necessary to treat or prevent imminent or life-threatening deterioration.  Critical care was time spent personally by me on the following activities: development of treatment plan with patient and/or surrogate as well as nursing, discussions with consultants, evaluation of patient's response to treatment, examination of patient, obtaining history from patient or surrogate, ordering and performing treatments and interventions, ordering and review of laboratory studies, ordering and review of radiographic studies, pulse oximetry and re-evaluation of patient's condition.  [id number]:  Patient feeling better. Pulse has normalized and BP has remained stable at 104/70s range. CT of chest ordered. Dr. Odis Luster to consult and determine disposition.      Brandt Loosen, MD 12/16/12 [phone]

## 2012-12-16 NOTE — Anesthesia Postprocedure Evaluation (Signed)
Anesthesia Post Note  Patient: Sandy Morales  Procedure(s) Performed: Procedure(s) (LRB): INCISION AND DRAINAGE Hematoma IN CHEST with drain placement (Left)  Anesthesia type: General  Patient location: PACU  Post pain: Pain level controlled and Adequate analgesia  Post assessment: Post-op Vital signs reviewed, Patient's Cardiovascular Status Stable, Respiratory Function Stable, Patent Airway and Pain level controlled  Last Vitals:  Filed Vitals:   12/16/12 1218  BP: 123/82  Pulse:   Temp:   Resp: 20    Post vital signs: Reviewed and stable  Level of consciousness: awake, alert  and oriented  Complications: No apparent anesthesia complications

## 2012-12-16 NOTE — Brief Op Note (Signed)
12/16/2012  12:19 PM  PATIENT:  Sandy Morales  62 y.o. female  PRE-OPERATIVE DIAGNOSIS:  fluid  POST-OPERATIVE DIAGNOSIS:  Hematoma left breast  PROCEDURE:  Procedure(s): INCISION AND DRAINAGE Hematoma IN CHEST with drain placement (Left)  SURGEON:  Surgeon(s) and Role:    * Etter Sjogren, MD - Primary  PHYSICIAN ASSISTANT:   ASSISTANTS: none   ANESTHESIA:   general  EBL:  Total I/O In: 1550 [I.V.:1200; Blood:350] Out: 500 [Urine:300; Blood:200]  BLOOD ADMINISTERED:1 unit CC PRBC  DRAINS: (2) Jackson-Pratt drain(s) with closed bulb suction in the left chest   LOCAL MEDICATIONS USED:  NONE  SPECIMEN:  No Specimen  DISPOSITION OF SPECIMEN:  N/A  COUNTS:  YES  TOURNIQUET:  * No tourniquets in log *  DICTATION: .Other Dictation: Dictation Number [id number]  PLAN OF CARE: Admit to inpatient   PATIENT DISPOSITION:  PACU - hemodynamically stable.   Delay start of Pharmacological VTE agent (>24hrs) due to surgical blood loss or risk of bleeding: yes

## 2012-12-16 NOTE — Anesthesia Preprocedure Evaluation (Addendum)
Anesthesia Evaluation  Patient identified by MRN, date of birth, ID band Patient awake    Reviewed: Allergy & Precautions, H&P , NPO status , Patient's Chart, lab work & pertinent test results  Airway Mallampati: II TM Distance: >3 FB Neck ROM: full    Dental  (+) Teeth Intact and Dental Advisory Given   Pulmonary neg pulmonary ROS,          Cardiovascular hypertension, Pt. on medications     Neuro/Psych    GI/Hepatic GERD-  Medicated and Controlled,  Endo/Other  Morbid obesity  Renal/GU      Musculoskeletal  (+) Arthritis -,   Abdominal   Peds  Hematology   Anesthesia Other Findings   Reproductive/Obstetrics Breast CA                          Anesthesia Physical Anesthesia Plan  ASA: II  Anesthesia Plan: General   Post-op Pain Management:    Induction: Intravenous  Airway Management Planned: LMA  Additional Equipment:   Intra-op Plan:   Post-operative Plan: Extubation in OR  Informed Consent: I have reviewed the patients History and Physical, chart, labs and discussed the procedure including the risks, benefits and alternatives for the proposed anesthesia with the patient or authorized representative who has indicated his/her understanding and acceptance.     Plan Discussed with: CRNA, Anesthesiologist and Surgeon  Anesthesia Plan Comments:         Anesthesia Quick Evaluation

## 2012-12-16 NOTE — ED Notes (Signed)
Pt from home via EMS. Pt c/o left chest pain. Left chest is very swollen and tender to touch where pt had implant placed recently. Pt took 325ASA PTA. Per EMS pt had two "focal seizures" while on scene.

## 2012-12-16 NOTE — ED Notes (Addendum)
Pt had episode on unresponsiveness. Pt appears stiff with eyes open, not responding to verbal or physical stimuli for approx 15seconds. Post-ictal phase for approx , pt able to state name during this time. Pt now alert and oriented x4. Dr. Lavella Lemons aware and at bedside

## 2012-12-16 NOTE — Transfer of Care (Signed)
Immediate Anesthesia Transfer of Care Note  Patient: Sandy Morales  Procedure(s) Performed: Procedure(s): INCISION AND DRAINAGE Hematoma IN CHEST with drain placement (Left)  Patient Location: PACU  Anesthesia Type:General  Level of Consciousness: awake  Airway & Oxygen Therapy: Patient Spontanous Breathing and Patient connected to nasal cannula oxygen  Post-op Assessment: Report given to PACU RN and Post -op Vital signs reviewed and stable  Post vital signs: Reviewed and stable  Complications: No apparent anesthesia complications

## 2012-12-16 NOTE — ED Notes (Signed)
Old and new EKG given to Dr Lavella Lemons

## 2012-12-16 NOTE — Consult Note (Signed)
Reason for Consult:Possible bleeding around breast implant Referring Physician: Dr. Lavella Morales - [facility]  Sandy Morales is an 62 y.o. female.  HPI: 62 yo female presents after having a left tissue expander exchanged for an implant by Dr. Etter Morales reportedly on Morales.  She woke up this morning and experienced pain in her left chest with expansion of the left breast.  She presented with diaphoresis and was found to be hypotensive.  The ED has begun fluid resuscitation and labs have been drawn.  We are asked to see the patient emergently.    Past Medical History  Diagnosis Date  . Hypertension   . GERD (gastroesophageal reflux disease)   . Breast cancer   . Dyslipidemia   . Osteoarthritis   . Gout     Past Surgical History  Procedure Laterality Date  . Knee surgery    . Abdominal hysterectomy    . Cyst excision      rt middle finger  . Mastectomy  04/27/2012     left breast  with tissue expander  . Mastectomy w/ sentinel node biopsy  04/27/2012    Procedure: MASTECTOMY WITH SENTINEL LYMPH NODE BIOPSY;  Surgeon: Sandy Morales;  Location: [facility] OR;  Service: General;  Laterality: Left;  LEFT SIMPLE MASTECTOMY; SENTINEL LYMPH NODE BIOPSY  . Tissue expander placement  04/27/2012    Procedure: TISSUE EXPANDER;  Surgeon: Sandy Sjogren, Morales;  Location: [facility] OR;  Service: Plastics;  Laterality: Left;  PLACEMENT OF LEFT BREAST TISSUE EXPANDER    Family History  Problem Relation Age of Onset  . Lung cancer Mother   . Lung cancer Father     Social History:  reports that she has never smoked. She has never used smokeless tobacco. She reports that she uses illicit drugs (Marijuana). She reports that she does not drink alcohol.  Allergies: No Known Allergies  Medications:  Prior to Admission medications   Medication Sig Start Date End Date Taking? Authorizing Provider  allopurinol (ZYLOPRIM) 100 MG tablet  10/30/12   Historical Provider, Morales  anastrozole (ARIMIDEX) 1 MG tablet Take 1  tablet (1 mg total) by mouth daily. 08/16/12   Sandy Dell, Morales  aspirin 81 MG tablet Take 81 mg by mouth daily.    Historical Provider, Morales  benazepril-hydrochlorthiazide (LOTENSIN HCT) 10-12.5 MG per tablet Take 1 tablet by mouth daily.    Historical Provider, Morales  colchicine 0.6 MG tablet Take 2 now and 1 in 1 hour.  May repeat dose once daily.  For gout attack. 11/22/11 11/21/12  Sandy Likes, Morales  diclofenac (VOLTAREN) 75 MG EC tablet  10/30/12   Historical Provider, Morales  famotidine (PEPCID) 20 MG tablet Take 20 mg by mouth 2 (two) times daily.    Historical Provider, Morales  methocarbamol (ROBAXIN) 500 MG tablet Take 1 tablet (500 mg total) by mouth 3 (three) times daily. 04/28/12   Sandy A. Cornett, Morales  oxyCODONE-acetaminophen (PERCOCET/ROXICET) 5-325 MG per tablet Take 1-2 tablets by mouth every 4 (four) hours as needed. 04/28/12   Sandy A. Cornett, Morales  pravastatin (PRAVACHOL) 40 MG tablet Take 40 mg by mouth daily.    Historical Provider, Morales  traMADol Janean Sark) 50 MG tablet  10/31/12   Historical Provider, Morales     Results for orders placed during the hospital encounter of 12/16/12 (from the past 48 hour(s))  GLUCOSE, CAPILLARY     Status: Abnormal   Collection Time    12/16/12  5:42 AM  Result Value Range   Glucose-Capillary 227 (*) 70 - 99 mg/dL  CG4 I-STAT (LACTIC ACID)     Status: Abnormal   Collection Time    12/16/12  6:35 AM      Result Value Range   Lactic Acid, Venous 4.26 (*) 0.5 - 2.2 mmol/L   CBC pending  No results found.  ROS Blood pressure 104/72, pulse 95, temperature 97.8 F (36.6 C), temperature source Oral, resp. rate 26, height 5' (1.524 m), weight 193 lb (87.544 kg), SpO2 97.00%. Physical Exam WDWN - positioned in Trendelenberg position; awake, able to answer questions Describes fullness and discomfort in left breast - "not really painful" Left breast incision - well-healed; lateral third of incision recently healed; covered with Dermabond.  No sign of  infection The whole breast seems firm and significantly larger than the right.  No bruising noted.  Non-tender to palpation  Assessment/Plan: Probable subpectoral hematoma   I was able to reach Sandy Morales who is on his way to evaluate the patient.  She may need exploration with explantation of the breast implant, but we will leave this up to Sandy Morales.  Fluid resuscitation Type and cross  Sandy Morales K. 12/16/2012, 6:48 AM

## 2012-12-16 NOTE — ED Notes (Signed)
Dr Manly at bedside 

## 2012-12-16 NOTE — Preoperative (Signed)
Beta Blockers   Reason not to administer Beta Blockers:Not on Beta Blocker 

## 2012-12-16 NOTE — H&P (Signed)
Patient had left breast reconstruction with removal of tissue expander and placement of silicone gel implant 11 days ago. She had done very well until she got out of bed at 4 am this morning and felt a sudden tightness in the left chest around the implant. She apparently passed out but there is no evidence she fell on her chest. She was brought to the ER with a low blood pressure that responded immediately to IV fluids. She is stable at present. Hgb is > than 11 on initial sample. WBC is 8.2. She felt chills for the first time in the ER but she had 3 large ice packs on and around her left chest. Chills resolved when these were removed.   Physical Exam HEENT:PERRL, EOMI. Neck supple without mass.  Lungs: Clear and equal breath sounds. Heart: RRR without murmur. Chest:Right breast is unremarkable. Left chest has a fluid collection surrounded the implant. It is not extremely hard. The fluid seems more consistent with seroma but the suddenness of onset argues in favor of blood. There is no tenderness and no evidence of cellulitis. Abdomen: Benign  Extremities:  Good tone.  Neurologic: Grossly intact.  Impression: Sudden onset of fluid collection left chest surrounding implant. Possible bleeding episode, seroma, or incipient infection.  Plan: Surgical exploration, drainage of fluid, cultures as indicated, placement of drains, possible removal of implant. I also discussed the possibility of blood transfusion with her and she understands those risks.  For more details, please see office notes and last OR report in the paper chart.

## 2012-12-16 NOTE — Progress Notes (Signed)
ANTIBIOTIC CONSULT NOTE - INITIAL  Pharmacy Consult for vancomycin Indication: swelling s/p breast implant  No Known Allergies  Patient Measurements: Height: 5' (152.4 cm) Weight: 193 lb (87.544 kg) IBW/kg (Calculated) : 45.5   Vital Signs: Temp: 96.8 F (36 C) (08/30 0855) Temp src: Core (Comment) (08/30 0855) BP: 123/80 mmHg (08/30 0855) Pulse Rate: 88 (08/30 0845) Intake/Output from previous day:   Intake/Output from this shift:    Labs:  Recent Labs  12/16/12 0605  WBC 8.2  HGB 11.3*  PLT 287  CREATININE 0.96   Estimated Creatinine Clearance: 59 ml/min (by C-G formula based on Cr of 0.96). No results found for this basename: VANCOTROUGH, VANCOPEAK, VANCORANDOM, GENTTROUGH, GENTPEAK, GENTRANDOM, TOBRATROUGH, TOBRAPEAK, TOBRARND, AMIKACINPEAK, AMIKACINTROU, AMIKACIN,  in the last 72 hours   Microbiology: No results found for this or any previous visit (from the past 720 hour(s)).  Medical History: Past Medical History  Diagnosis Date  . Hypertension   . GERD (gastroesophageal reflux disease)   . Breast cancer   . Dyslipidemia   . Osteoarthritis   . Gout     Medications:   (Not in a hospital admission) Assessment: 62 yo lady with a h/o left mastectomy s/p breast implant about a week ago to start vancomycin for swelling  noted around implant, surgical prophylaxis.  Her WBC 8.2, SrCr 0.96  Goal of Therapy:  Vancomycin trough level 10-15 mcg/ml  Plan:  Vancomycin 1500 mg IV q24 hours. F/u renal function, cultures and clinical course.  Nahome Bublitz Poteet 12/16/2012,8:59 AM

## 2012-12-16 NOTE — Consult Note (Signed)
Reason for Consult:swelling Referring Physician: [name] is an 62 y.o. female.  HPI: The pt is a 62 yo bf who is 8 months s/p left mastectomy by Dr. Luisa Hart. She had a reconstruction done by Dr. Odis Luster. She was taken back to OR about a week ago to exchange the tissue expander for an implant. She was doing well until this morning about 4am when she got up to go to the bathroom. When she got back to bed she felt a terrible tightness in her chest and noticed some swelling. There is some question as to whether she passed out. She does not recall falling on her chest.  Past Medical History  Diagnosis Date  . Hypertension   . GERD (gastroesophageal reflux disease)   . Breast cancer   . Dyslipidemia   . Osteoarthritis   . Gout     Past Surgical History  Procedure Laterality Date  . Knee surgery    . Abdominal hysterectomy    . Cyst excision      rt middle finger  . Mastectomy  04/27/2012     left breast  with tissue expander  . Mastectomy w/ sentinel node biopsy  04/27/2012    Procedure: MASTECTOMY WITH SENTINEL LYMPH NODE BIOPSY;  Surgeon: Clovis Pu. Cornett, MD;  Location: [facility] OR;  Service: General;  Laterality: Left;  LEFT SIMPLE MASTECTOMY; SENTINEL LYMPH NODE BIOPSY  . Tissue expander placement  04/27/2012    Procedure: TISSUE EXPANDER;  Surgeon: Etter Sjogren, MD;  Location: [facility] OR;  Service: Plastics;  Laterality: Left;  PLACEMENT OF LEFT BREAST TISSUE EXPANDER    Family History  Problem Relation Age of Onset  . Lung cancer Mother   . Lung cancer Father     Social History:  reports that she has never smoked. She has never used smokeless tobacco. She reports that she uses illicit drugs (Marijuana). She reports that she does not drink alcohol.  Allergies: No Known Allergies  Medications: I have reviewed the patient's current medications.  Results for orders placed during the hospital encounter of 12/16/12 (from the past 48 hour(s))  GLUCOSE, CAPILLARY      Status: Abnormal   Collection Time    12/16/12  5:42 AM      Result Value Range   Glucose-Capillary 227 (*) 70 - 99 mg/dL  LACTIC ACID, PLASMA     Status: Abnormal   Collection Time    12/16/12  5:57 AM      Result Value Range   Lactic Acid, Venous 4.6 (*) 0.5 - 2.2 mmol/L  CBC WITH DIFFERENTIAL     Status: Abnormal   Collection Time    12/16/12  6:05 AM      Result Value Range   WBC 8.2  4.0 - 10.5 K/uL   RBC 3.58 (*) 3.87 - 5.11 MIL/uL   Hemoglobin 11.3 (*) 12.0 - 15.0 g/dL   HCT 40.9 (*) 81.1 - 91.4 %   MCV 91.3  78.0 - 100.0 fL   MCH 31.6  26.0 - 34.0 pg   MCHC 34.6  30.0 - 36.0 g/dL   RDW 78.2  95.6 - 21.3 %   Platelets 287  150 - 400 K/uL   Neutrophils Relative % 47  43 - 77 %   Neutro Abs 3.9  1.7 - 7.7 K/uL   Lymphocytes Relative 41  12 - 46 %   Lymphs Abs 3.3  0.7 - 4.0 K/uL   Monocytes Relative 5  3 -  12 %   Monocytes Absolute 0.4  0.1 - 1.0 K/uL   Eosinophils Relative 7 (*) 0 - 5 %   Eosinophils Absolute 0.6  0.0 - 0.7 K/uL   Basophils Relative 0  0 - 1 %   Basophils Absolute 0.0  0.0 - 0.1 K/uL  COMPREHENSIVE METABOLIC PANEL     Status: Abnormal   Collection Time    12/16/12  6:05 AM      Result Value Range   Sodium 141  135 - 145 mEq/L   Potassium 3.1 (*) 3.5 - 5.1 mEq/L   Chloride 101  96 - 112 mEq/L   CO2 23  19 - 32 mEq/L   Glucose, Bld 200 (*) 70 - 99 mg/dL   BUN 18  6 - 23 mg/dL   Creatinine, Ser 0.45  0.50 - 1.10 mg/dL   Calcium 9.7  8.4 - 40.9 mg/dL   Total Protein 7.7  6.0 - 8.3 g/dL   Albumin 3.7  3.5 - 5.2 g/dL   AST 18  0 - 37 U/L   ALT 18  0 - 35 U/L   Alkaline Phosphatase 74  39 - 117 U/L   Total Bilirubin 0.6  0.3 - 1.2 mg/dL   GFR calc non Af Amer 62 (*) >90 mL/min   GFR calc Af Amer 71 (*) >90 mL/min   Comment: (NOTE)     The eGFR has been calculated using the CKD EPI equation.     This calculation has not been validated in all clinical situations.     eGFR's persistently <90 mL/min signify possible Chronic Kidney     Disease.   TYPE AND SCREEN     Status: None   Collection Time    12/16/12  6:35 AM      Result Value Range   ABO/RH(D) O POS     Antibody Screen NEG     Sample Expiration 12/19/2012     Unit Number [id number]     Blood Component Type RED CELLS,LR     Unit division 00     Status of Unit ALLOCATED     Transfusion Status OK TO TRANSFUSE     Crossmatch Result Compatible     Unit Number [id number]     Blood Component Type RED CELLS,LR     Unit division 00     Status of Unit ALLOCATED     Transfusion Status OK TO TRANSFUSE     Crossmatch Result Compatible    CG4 I-STAT (LACTIC ACID)     Status: Abnormal   Collection Time    12/16/12  6:35 AM      Result Value Range   Lactic Acid, Venous 4.26 (*) 0.5 - 2.2 mmol/L  PREPARE RBC (CROSSMATCH)     Status: None   Collection Time    12/16/12  6:35 AM      Result Value Range   Order Confirmation ORDER PROCESSED BY BLOOD BANK    ABO/RH     Status: None   Collection Time    12/16/12  6:35 AM      Result Value Range   ABO/RH(D) O POS    PROTIME-INR     Status: None   Collection Time    12/16/12  6:52 AM      Result Value Range   Prothrombin Time 13.4  11.6 - 15.2 seconds   INR 1.04  0.00 - 1.49    No results found.  Review of Systems  HENT: Negative.  Eyes: Negative.   Respiratory: Negative.   Cardiovascular: Positive for chest pain.  Gastrointestinal: Negative.   Genitourinary: Negative.   Musculoskeletal: Negative.   Skin: Negative.   Neurological: Positive for weakness.  Endo/Heme/Allergies: Negative.   Psychiatric/Behavioral: Negative.    Blood pressure 108/68, pulse 82, temperature 97.8 F (36.6 C), temperature source Oral, resp. rate 29, height 5' (1.524 m), weight 193 lb (87.544 kg), SpO2 100.00%. Physical Exam  Constitutional: She is oriented to person, place, and time. She appears well-developed and well-nourished.  HENT:  Head: Normocephalic and atraumatic.  Eyes: Conjunctivae and EOM are normal. Pupils are equal,  round, and reactive to light.  Neck: Normal range of motion. Neck supple.  Cardiovascular: Normal rate, regular rhythm and normal heart sounds.   Respiratory: Effort normal and breath sounds normal.  There is swelling of left chest wall where her implant was recently placed but no bruising. Incision looks good  GI: Soft. Bowel sounds are normal.  Musculoskeletal: Normal range of motion.  Neurological: She is alert and oriented to person, place, and time.  Skin: Skin is warm and dry.  Psychiatric: She has a normal mood and affect. Her behavior is normal.    Assessment/Plan: The pt may have had some bleeding in the pocket where the implant is. Her skin looks good though with no bruising. Her BP is recovering with some fluid. I will defer the decision as to whether to reexplore the wound to Dr. Odis Luster but we will follow closely  TOTH III,Elliette Seabolt S 12/16/2012, 7:56 AM

## 2012-12-16 NOTE — ED Notes (Addendum)
Dr. Corliss Skains at bedside for assessment, pt mentating well

## 2012-12-17 LAB — TYPE AND SCREEN
ABO/RH(D): O POS
Antibody Screen: NEGATIVE
Unit division: 0
Unit division: 0

## 2012-12-17 LAB — GLUCOSE, CAPILLARY
Glucose-Capillary: 116 mg/dL — ABNORMAL HIGH (ref 70–99)
Glucose-Capillary: 136 mg/dL — ABNORMAL HIGH (ref 70–99)
Glucose-Capillary: 107 mg/dL — ABNORMAL HIGH (ref 70–99)
Glucose-Capillary: 96 mg/dL (ref 70–99)

## 2012-12-17 LAB — HEMOGLOBIN AND HEMATOCRIT, BLOOD
HCT: 29.1 % — ABNORMAL LOW (ref 36.0–46.0)
Hemoglobin: 10 g/dL — ABNORMAL LOW (ref 12.0–15.0)

## 2012-12-17 NOTE — Op Note (Signed)
Sandy Morales, Sandy Morales NO.:  [id number]  MEDICAL RECORD NO.:  [id number]  LOCATION:  3S14C                        FACILITY:  [facility]  PHYSICIAN:  Etter Sjogren, M.D.     DATE OF BIRTH:  [birth year]  DATE OF PROCEDURE:  12/16/2012 DATE OF DISCHARGE:                              OPERATIVE REPORT   PREOPERATIVE DIAGNOSIS:  Probable bleeding post breast reconstruction, left chest.  POSTOPERATIVE DIAGNOSIS:  Hematoma, left chest.  PROCEDURE PERFORMED: 1. Removal of implant with evacuation of hematoma and placement of     drains. 2. Replacement of implant, left chest.  ANESTHESIA:  General.  ESTIMATED BLOOD LOSS:  150 mL intraoperatively.  CLINICAL NOTE:  This is a 62 year old woman who has had breast cancer and had left mastectomy and placement of tissue expander.  Eleven days ago, she had removal of tissue expander and delayed breast reconstruction with an implant.  She had done well until early this morning.  She was getting out of bed and felt a sudden tightness and pain in left chest.  She subsequently became hypotensive and fainted without any history of falling on her chest.  She was brought to the emergency room where she was seen by General Surgery and was felt that she did have bleeding in the left chest around the reconstruction.  On evaluation, it was felt that she had fluid, it is difficult to tell if this represented a very large seroma or bleeding, but given the fact of a sudden onset of bleeding was the favored preoperative diagnosis.  CT scan felt that this was blood rather than seroma fluid.  She was taken to the operating room for surgery.  Prior to transfer to the operating room, the procedure was discussed with her and her family in great detail.  They understood that this is reexploration of the chest and there was a possibility that the implant would not be replaced.  She understood these drains and that there was a risk including, but  not limited to, bleeding, infection, healing problems, scarring, loss of sensation, fluid accumulations, pneumothorax, pulmonary embolism, DVT, and asymmetry as well as the possibility that if the implant were replaced that she could develop infection and eventually have to have the implant removed.  We also discussed the possibility of blood transfusion given the fact that she equilibrated at 8.4 and it is felt that she would probably drop a little bit lower during the surgical procedure.  She was in agreement with that as well, understanding the risks, possible complications of that.  She wished to proceed.  DESCRIPTION OF PROCEDURE:  The patient was taken to the operating room and placed supine.  After successful induction of general anesthesia, she was prepped with ChloraPrep and after waiting full 3 minutes for drying, she was draped with sterile drapes including impervious drapes. The lateral aspect of the old mastectomy scar was utilized. The dissection was carried to the muscle.  The muscle was opened and the submuscular space was entered and there was a fairly large hematoma. This was dissected posteriorly behind her latissimus muscle and anterior edge and also there was some hematoma superiorly.  The hematoma was evacuated.  On evacuating  the hematoma superiorly along the superior aspect of the capsule, a bleeding vessel was encountered underneath the pectoralis major muscle.  This appeared to be the source of the hematoma.  This was a arterial bleeder and it was suture ligated with 3- 0 Vicryl suture ligature and then also 3-0 silk suture ligature for reinforcement.  There was approximately 150 mL blood loss during the procedure and this portion of the procedure.  H and H was rechecked and it had dropped to 7 and it was felt that it was wise to proceed with a blood transfusion.  Thorough irrigation with saline and excellent hemostasis was now noted.  Two 19-French drains  positioned, 1 laterally and 1 medially that went to the superior area where that bleeding had occurred.  The space was now very clean.  There was no evidence of any purulence.  This was all related to hematoma.  The implant had been soaked with antibiotic solution throughout the procedure and it was gently replaced and the closure with 3-0 Vicryl simple interrupted sutures for the muscle and capsule layer followed by 3-0 Monocryl interrupted inverted deep dermal sutures and a few interspersed 2-0 Prolene simple sutures for the skin.  Biopatch was placed around the drains and ABDs placed around the wound and one was placed at the lateral chest as well and then a circumferential Ace wrap wrapping from her left towards the right in order to place a bit of pressure especially laterally and superiorly and then the chest vest was also positioned as well.  She was transferred to recovery room stable having tolerated the procedure well.  DISPOSITION:  She will be admitted to the hospital.     Etter Sjogren, M.D.     DB/MEDQ  D:  12/16/2012  T:  12/16/2012  Job:  [id number]

## 2012-12-17 NOTE — Progress Notes (Signed)
Subjective: Feels much better. Minimal discomfort.Tolerating liquids well.  Objective: Vital signs in last 24 hours: Temp:  [97.7 F (36.5 C)-99 F (37.2 C)] 99 F (37.2 C) (08/31 0700) Pulse Rate:  [80-94] 81 (08/31 0700) Resp:  [14-21] 20 (08/31 0700) BP: (107-137)/(65-89) 119/75 mmHg (08/31 0700) SpO2:  [97 %-100 %] 97 % (08/31 0700) Weight:  [207 lb 0.2 oz (93.9 kg)] 207 lb 0.2 oz (93.9 kg) (08/30 1345)  Intake/Output from previous day: 08/30 0701 - 08/31 0700 In: 2812.5 [P.O.:900; I.V.:1300; Blood:612.5] Out: 2820 [Urine:2450; Drains:170; Blood:200] Intake/Output this shift: Total I/O In: 150 [P.O.:150] Out: -   Operative sites: Left chest is soft. No evidence of bleeding or hematoma. Drains functioning. Drainage thin.   Recent Labs  12/16/12 0605 12/16/12 0855 12/16/12 1136 12/17/12 0400  WBC 8.2 13.2*  --   --   HGB 11.3* 8.4* 7.1* 10.0*  HCT 32.7* 24.5* 21.0* 29.1*  NA 141  --  142  --   K 3.1*  --  4.3  --   CL 101  --   --   --   CO2 23  --   --   --   BUN 18  --   --   --   CREATININE 0.96  --   --   --     Studies/Results: Ct Chest W Contrast  12/16/2012   *RADIOLOGY REPORT*  Clinical Data: 62 year old female with new left breast and chest pain and swelling.  Patient with left breast cancer, left mastectomy and left implant placed on 12/04/2012.  CT CHEST WITH CONTRAST  Technique:  Multidetector CT imaging of the chest was performed following the standard protocol during bolus administration of intravenous contrast.  Contrast: 80mL OMNIPAQUE IOHEXOL 300 MG/ML  SOLN  Comparison: None  Findings: A 12 x 3.5 x 12.1 cm collection along the left lateral chest wall (45-50 HU), posterior-inferior to the implant noted likely representing hemorrhage/hematoma. A left breast implant is identified with moderate amount of surrounding fluid which is increased density (45-55 HU).  This also likely represents hemorrhage. There is no evidence of active arterial extravasation.  There is no evidence of rib abnormality/fracture.  The heart and great vessels are within normal limits. There is no evidence of mediastinal hematoma, pneumothorax, thoracic aortic aneurysm or dissection.  The lungs are clear.  There is no evidence of pulmonary nodule, mass, airspace disease or consolidation.  No acute or suspicious bony abnormalities are noted.  IMPRESSION: 12 x 3.5 x 12.1 cm left lateral chest wall collection posterior - inferior to the left breast implant which has a moderate amount of adjacent fluid as well - both likely representing hemorrhage.  No evidence of rib fracture.   Original Report Authenticated By: Harmon Pier, M.D.   Dg Chest Port 1 View  12/16/2012   *RADIOLOGY REPORT*  Clinical Data: Chest pain  PORTABLE CHEST - 1 VIEW  Comparison: 04/21/2012  Findings: The cardiac shadow is stable.  The lungs are well-aerated bilaterally.  No focal bony abnormality is noted.  IMPRESSION: No acute abnormalities seen.   Original Report Authenticated By: Alcide Clever, M.D.    Assessment/Plan: Transfer to [address] [place]. Ambulate. Continue Vancomycin for prophylaxis.   LOS: 1 day    Sandy Morales M 12/17/2012 10:14 AM

## 2012-12-18 LAB — GLUCOSE, CAPILLARY: Glucose-Capillary: 100 mg/dL — ABNORMAL HIGH (ref 70–99)

## 2012-12-18 MED ORDER — SULFAMETHOXAZOLE-TMP DS 800-160 MG PO TABS: 1.0000 | ORAL_TABLET | Freq: Two times a day (BID) | ORAL | Status: DC

## 2012-12-18 MED ORDER — SULFAMETHOXAZOLE-TMP DS 800-160 MG PO TABS
1.0000 | ORAL_TABLET | Freq: Two times a day (BID) | ORAL | Status: DC
  Administered 2012-12-18: 1 via ORAL
  Filled 2012-12-18: qty 1

## 2012-12-18 MED ORDER — DSS 100 MG PO CAPS: 100.0000 mg | ORAL_CAPSULE | Freq: Every day | ORAL | Status: AC

## 2012-12-18 MED ORDER — OXYCODONE-ACETAMINOPHEN 5-325 MG PO TABS: 1.0000 | ORAL_TABLET | ORAL | Status: DC | PRN

## 2012-12-18 NOTE — Discharge Summary (Signed)
Physician Discharge Summary  Patient ID: Sandy Morales MRN: [id number] DOB/AGE: 07-29-1950 62 y.o.  Admit date: 12/16/2012 Discharge date: 12/18/2012  Admission Diagnoses:Hematoma left chest at previous breast reconstruction for breast cancer  Discharge Diagnoses: Same Active Problems:   * No active hospital problems. *   Discharged Condition: good  Hospital Course: On the day of admission the patient was taken to surgery and had left chest exploration and drainage of hematoma with replacement of implant. She was transfused 2 units of blood and Hgb the following day was 10. She has been stable since surgery without any sign of bleeding. She would like to go home and it is felt that she is ready for discharge. Her blood sugars have been slightly elevated and she may be a new onset  Type 2 diabetic. She will follow up with her primary physician regarding this.  Significant Diagnostic Studies: Intra-op Hgb 7. Hgb post transfusion on first post-op day was 10.  Treatments: antibiotics: vancomycin, anticoagulation: none and surgery: left chest drainage of hematoma and replacement of implant.  Discharge Exam: Blood pressure 116/77, pulse 85, temperature 98.3 F (36.8 C), temperature source Oral, resp. rate 16, height 5' (1.524 m), weight 207 lb 0.2 oz (93.9 kg), SpO2 97.00%.  Operative sites: Implant in good position. Drains functioning. Drainage thin. Chest is soft and there is no evidence of bleeding, hematoma, or infection.  Disposition: 01-Home or Self Care  Discharge Orders   Future Appointments Provider Department Dept Phone   01/04/2013 10:30 AM [facility] - [phone] [facility] [phone]   01/04/2013 11:00 AM [facility] [phone]   Patient should wear two piece clothing and wear no powder or deodorant. Patient should arrive 15 minutes early.   02/07/2013 10:30 AM [facility] Only [facility] [phone]   02/14/2013 10:45 AM Amy Allegra Grana, PA-C [facility] [phone]   Future Orders Complete By Expires   Diet - low sodium heart healthy  As directed        Medication List    STOP taking these medications       aspirin 81 MG tablet     traMADol 50 MG tablet  Commonly known as:  ULTRAM      TAKE these medications       allopurinol 100 MG tablet  Commonly known as:  ZYLOPRIM  Take 100 mg by mouth daily.     anastrozole 1 MG tablet  Commonly known as:  ARIMIDEX  Take 1 tablet (1 mg total) by mouth daily.     benazepril-hydrochlorthiazide 10-12.5 MG per tablet  Commonly known as:  LOTENSIN HCT  Take 1 tablet by mouth daily.     DSS 100 MG Caps  Take 100 mg by mouth daily.     famotidine 20 MG tablet  Commonly known as:  PEPCID  Take 20 mg by mouth 2 (two) times daily.     oxyCODONE-acetaminophen 5-325 MG per tablet  Commonly known as:  PERCOCET/ROXICET  Take 1-2 tablets by mouth every 4 (four) hours as needed.     pravastatin 80 MG tablet  Commonly known as:  PRAVACHOL  Take 80 mg by mouth daily.     sulfamethoxazole-trimethoprim 800-160 MG per tablet  Commonly known as:  BACTRIM DS  Take 1 tablet by mouth every 12 (twelve) hours.         SignedOdis Luster, Doraine Schexnider M 12/18/2012, 8:45 AM

## 2012-12-18 NOTE — Progress Notes (Signed)
Pt was discharged home, accompanied by family. Pt received discharge instructions about follow up appointments and prescriptions. Signed copy of instructions in patient chart.   Sandy Morales, Charity fundraiser

## 2012-12-19 ENCOUNTER — Encounter (HOSPITAL_COMMUNITY): Payer: Self-pay | Admitting: Plastic Surgery

## 2012-12-21 LAB — CULTURE, BLOOD (ROUTINE X 2)

## 2012-12-22 LAB — CULTURE, BLOOD (ROUTINE X 2): Culture: NO GROWTH

## 2013-01-04 ENCOUNTER — Other Ambulatory Visit: Payer: Self-pay

## 2013-01-04 ENCOUNTER — Ambulatory Visit
Admission: RE | Admit: 2013-01-04 | Discharge: 2013-01-04 | Disposition: A | Discharge status: Home / Self Care | Payer: Medicare Other | Source: Ambulatory Visit | Attending: Physician Assistant | Admitting: Physician Assistant

## 2013-01-04 DIAGNOSIS — Z1231 Encounter for screening mammogram for malignant neoplasm of breast: Secondary | ICD-10-CM

## 2013-01-18 ENCOUNTER — Ambulatory Visit
Admission: RE | Admit: 2013-01-18 | Discharge: 2013-01-18 | Disposition: A | Discharge status: Home / Self Care | Payer: Medicare Other | Source: Ambulatory Visit | Attending: Physician Assistant | Admitting: Physician Assistant

## 2013-01-18 DIAGNOSIS — C50912 Malignant neoplasm of unspecified site of left female breast: Secondary | ICD-10-CM

## 2013-01-18 DIAGNOSIS — C50412 Malignant neoplasm of upper-outer quadrant of left female breast: Secondary | ICD-10-CM

## 2013-01-18 DIAGNOSIS — Z780 Asymptomatic menopausal state: Secondary | ICD-10-CM

## 2013-02-07 ENCOUNTER — Other Ambulatory Visit (HOSPITAL_BASED_OUTPATIENT_CLINIC_OR_DEPARTMENT_OTHER): Payer: Medicare Other

## 2013-02-07 ENCOUNTER — Encounter (INDEPENDENT_AMBULATORY_CARE_PROVIDER_SITE_OTHER): Payer: Self-pay

## 2013-02-07 DIAGNOSIS — C50419 Malignant neoplasm of upper-outer quadrant of unspecified female breast: Secondary | ICD-10-CM

## 2013-02-07 DIAGNOSIS — C50912 Malignant neoplasm of unspecified site of left female breast: Secondary | ICD-10-CM

## 2013-02-07 DIAGNOSIS — C50412 Malignant neoplasm of upper-outer quadrant of left female breast: Secondary | ICD-10-CM

## 2013-02-07 LAB — CBC WITH DIFFERENTIAL/PLATELET
BASO%: 1.1 % (ref 0.0–2.0)
Basophils Absolute: 0.1 10*3/uL (ref 0.0–0.1)
EOS%: 3.9 % (ref 0.0–7.0)
Eosinophils Absolute: 0.2 10*3/uL (ref 0.0–0.5)
HCT: 41.1 % (ref 34.8–46.6)
HGB: 13.6 g/dL (ref 11.6–15.9)
LYMPH%: 25.1 % (ref 14.0–49.7)
MCH: 30.4 pg (ref 25.1–34.0)
MCHC: 33.2 g/dL (ref 31.5–36.0)
MCV: 91.7 fL (ref 79.5–101.0)
MONO#: 0.6 10*3/uL (ref 0.1–0.9)
MONO%: 10.3 % (ref 0.0–14.0)
NEUT#: 3.3 10*3/uL (ref 1.5–6.5)
NEUT%: 59.6 % (ref 38.4–76.8)
Platelets: 256 10*3/uL (ref 145–400)
RBC: 4.48 10*6/uL (ref 3.70–5.45)
RDW: 14.3 % (ref 11.2–14.5)
WBC: 5.5 10*3/uL (ref 3.9–10.3)
lymph#: 1.4 10*3/uL (ref 0.9–3.3)

## 2013-02-07 LAB — COMPREHENSIVE METABOLIC PANEL (CC13)
ALT: 10 U/L (ref 0–55)
AST: 12 U/L (ref 5–34)
Albumin: 4.1 g/dL (ref 3.5–5.0)
Alkaline Phosphatase: 84 U/L (ref 40–150)
Anion Gap: 15 mEq/L — ABNORMAL HIGH (ref 3–11)
BUN: 12.9 mg/dL (ref 7.0–26.0)
CO2: 21 mEq/L — ABNORMAL LOW (ref 22–29)
Calcium: 10.2 mg/dL (ref 8.4–10.4)
Chloride: 107 mEq/L (ref 98–109)
Creatinine: 1 mg/dL (ref 0.6–1.1)
Glucose: 117 mg/dl (ref 70–140)
Potassium: 3.3 mEq/L — ABNORMAL LOW (ref 3.5–5.1)
Sodium: 142 mEq/L (ref 136–145)
Total Bilirubin: 0.64 mg/dL (ref 0.20–1.20)
Total Protein: 8.4 g/dL — ABNORMAL HIGH (ref 6.4–8.3)

## 2013-02-14 ENCOUNTER — Telehealth: Payer: Self-pay | Admitting: Oncology

## 2013-02-14 ENCOUNTER — Encounter: Payer: Self-pay | Admitting: Family

## 2013-02-14 ENCOUNTER — Ambulatory Visit (HOSPITAL_BASED_OUTPATIENT_CLINIC_OR_DEPARTMENT_OTHER): Payer: Medicare Other | Admitting: Family

## 2013-02-14 VITALS — BP 147/95 | HR 98 | Temp 98.5°F | Resp 18 | Ht 60.0 in | Wt 196.4 lb

## 2013-02-14 DIAGNOSIS — E876 Hypokalemia: Secondary | ICD-10-CM

## 2013-02-14 DIAGNOSIS — M899 Disorder of bone, unspecified: Secondary | ICD-10-CM

## 2013-02-14 DIAGNOSIS — M8580 Other specified disorders of bone density and structure, unspecified site: Secondary | ICD-10-CM

## 2013-02-14 DIAGNOSIS — Z853 Personal history of malignant neoplasm of breast: Secondary | ICD-10-CM

## 2013-02-14 NOTE — Telephone Encounter (Signed)
, °

## 2013-02-14 NOTE — Patient Instructions (Addendum)
Please contact us at [phone] if you have any questions or concerns.  Please continue to do well and enjoy life!!!  Get plenty of rest, drink plenty of water, exercise daily (walking), eat a balanced diet.  Take calcium 600 mg daily in addition to vitamin D3 1000 IUs daily.   Complete monthly self-breast examinations.  Have a clinical breast exam by a physician every year.  Have your mammogram completed every year.  [facility]  Address: [address] [place], [place] [address]  [phone]  Hours:  10:00 am - 5:00 pm   Your potassium is low.  Make sure Dr. Dione Plover checks your potassium next month during your office visit.   Eat more potassium in your diet including:   Potassium Rich Foods List - Foods High in Potassium If you have high blood pressure, it is recommended to maintain a diet high in potassium rich food.  Try to include as many potassium rich foods in your diet as as possible, some foods high in potassium are various fruits, vegetables, dairy foods, and fish.        List of Foods High in Potassium: Foods with Potassium  Serving Size Potassium (mg)  Apricots, dried 10 halves 407  Avocados, raw 1 ounce 180  Bananas, raw 1 cup 594  Beets, cooked 1 cup 519  Brussel sprouts, cooked 1 cup 504  Cantaloupe 1 cup 494  Dates, dry 5 dates 271  Figs, dry 2 figs 271  Kiwi fruit, raw 1 medium 252  Lima beans 1 cup 955  Melons, honeydew 1 cup 461  Milk, fat free or skim 1 cup 407  Nectarines 1 nectarine 288  Orange juice 1 cup 496  Oranges 1 orange 237  Pears (fresh) 1 pear 208  Peanuts dry roasted, unsalted 1 ounce 187  Potatoes, baked, 1 potato 1081  Prune juice 1 cup 707  Prunes, dried 1 cup 828  Raisins 1 cup 1089  Spinach, cooked 1 cup 839  Tomato products, canned sauce 1 cup 909  Winter squash 1 cup 896  Yogurt plain, skim milk 8 ounces 579   Results for orders placed in visit on 02/07/13 (from the past 336 hour(s))   CBC WITH DIFFERENTIAL   Collection Time    02/07/13 10:29 AM      Result Value Range   WBC 5.5  3.9 - 10.3 10e3/uL   NEUT# 3.3  1.5 - 6.5 10e3/uL   HGB 13.6  11.6 - 15.9 g/dL   HCT 81.1  91.4 - 78.2 %   Platelets 256  145 - 400 10e3/uL   MCV 91.7  79.5 - 101.0 fL   MCH 30.4  25.1 - 34.0 pg   MCHC 33.2  31.5 - 36.0 g/dL   RBC 9.56  2.13 - 0.86 10e6/uL   RDW 14.3  11.2 - 14.5 %   lymph# 1.4  0.9 - 3.3 10e3/uL   MONO# 0.6  0.1 - 0.9 10e3/uL   Eosinophils Absolute 0.2  0.0 - 0.5 10e3/uL   Basophils Absolute 0.1  0.0 - 0.1 10e3/uL   NEUT% 59.6  38.4 - 76.8 %   LYMPH% 25.1  14.0 - 49.7 %   MONO% 10.3  0.0 - 14.0 %   EOS% 3.9  0.0 - 7.0 %   BASO% 1.1  0.0 - 2.0 %  COMPREHENSIVE METABOLIC PANEL (CC13)   Collection Time    02/07/13 10:29 AM      Result Value  Range   Sodium 142  136 - 145 mEq/L   Potassium 3.3 (*) 3.5 - 5.1 mEq/L   Chloride 107  98 - 109 mEq/L   CO2 21 (*) 22 - 29 mEq/L   Glucose 117  70 - 140 mg/dl   BUN 16.1  7.0 - 09.6 mg/dL   Creatinine 1.0  0.6 - 1.1 mg/dL   Total Bilirubin 0.45  0.20 - 1.20 mg/dL   Alkaline Phosphatase 84  40 - 150 U/L   AST 12  5 - 34 U/L   ALT 10  0 - 55 U/L   Total Protein 8.4 (*) 6.4 - 8.3 g/dL   Albumin 4.1  3.5 - 5.0 g/dL   Calcium 40.9  8.4 - 81.1 mg/dL   Anion Gap 15 (*) 3 - 11 mEq/L

## 2013-02-14 NOTE — Progress Notes (Signed)
[facility]  [phone] [phone]  OFFICE PROGRESS NOTE   ID: Sandy Morales   DOB: 07/07/1950  MR#: [id number]  JYN#:[id number]  PCP: Fleet Contras, MD SU: Harriette Bouillon, M.D. SU: Etter Sjogren, M.D. RAD ONC: Lurline Hare, M.D.   HISTORY OF PRESENT ILLNESS: Sandy Morales had routine screening mammography at [facility] 01/04/2012 showing heterogeneously dense breasts. In addition, in the left breast, new calcifications were noted. Left diagnostic mammography at the breast center 01/11/2012 confirmed amorphous calcifications in the left upper outer quadrant. Biopsy was performed 01/18/2012, and showed ([id number]) ductal carcinoma in situ, grade 3, strongly estrogen and progesterone receptor positive. The patient's subsequent history is as detailed below.   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  Since her last office visit on 11/15/2012, she states that she has intermittent left breast pain, and hot flashes, that are both tolerable.  Her interval history is significant for undergoing incision and drainage of hematoma in her chest with drain placement and left breast implant replacement by Dr. Odis Luster on 12/16/2012.  Her interval history is otherwise unremarkable and stable.   REVIEW OF SYSTEMS: A 10 point review of systems was completed and is negative except as noted above.  Sandy Morales denies any other symptomatology including fatigue, fever or chills, headache, vision changes, swollen glands, cough or shortness of breath, chest pain or discomfort, nausea, vomiting, diarrhea, constipation, change in urinary or bowel habits, any other arthralgias/myalgias, unusual bleeding/bruising or any other symptomatology.  A detailed review of systems is otherwise noncontributory.   PAST MEDICAL HISTORY: Past Medical History  Diagnosis Date  . Hypertension   . GERD (gastroesophageal reflux  disease)   . Breast cancer   . Dyslipidemia   . Osteoarthritis   . Gout     PAST SURGICAL HISTORY: Past Surgical History  Procedure Laterality Date  . Knee surgery    . Abdominal hysterectomy    . Cyst excision      rt middle finger  . Mastectomy  04/27/2012     left breast  with tissue expander  . Mastectomy w/ sentinel node biopsy  04/27/2012    Procedure: MASTECTOMY WITH SENTINEL LYMPH NODE BIOPSY;  Surgeon: Clovis Pu. Cornett, MD;  Location: [facility] OR;  Service: General;  Laterality: Left;  LEFT SIMPLE MASTECTOMY; SENTINEL LYMPH NODE BIOPSY  . Tissue expander placement  04/27/2012    Procedure: TISSUE EXPANDER;  Surgeon: Etter Sjogren, MD;  Location: [facility] OR;  Service: Plastics;  Laterality: Left;  PLACEMENT OF LEFT BREAST TISSUE EXPANDER  . Incision and drainage of wound Left 12/16/2012    Procedure: INCISION AND DRAINAGE Hematoma IN CHEST with drain placement;  Surgeon: Etter Sjogren, MD;  Location: [facility] OR;  Service: Plastics;  Laterality: Left;    FAMILY HISTORY Family History  Problem Relation Age of Onset  . Cancer Mother     Unknown type of cancer  . Lung cancer Father   The patient's father died from lung cancer in the setting of tobacco abuse. The patient's mother died from cancer as well. The patient is not quite sure of the age of death of either of her parents. Sandy Morales had 3 brothers and 7 sisters. She is not aware of any breast or ovarian cancer history in the family.   GYNECOLOGIC HISTORY: First menstrual period age 38. First live birth age 83. She is GX P3. She underwent TAH-BSO in 1995. She never used hormone replacement.  SOCIAL HISTORY: (updated OCT 2013) Sandy Morales worked for a [employer] for 32 years. 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 She has been widowed for approximately 11 years. She lives by herself. Her children are Sandy Morales, who is currently incarcerated; Sandy Morales, who lives in [place] and works in Holiday representative;  and Sandy Morales, who is disabled secondary to neuro-fibromatosis. The patient has 16 grandchildren and 2 great-grandchildren. She attends a [employer], adult church.   ADVANCED DIRECTIVES: Not in place; the patient states that she has talked with her sons Sandy Morales and Sandy Morales and that they would make decisions together if she became mantle incapacitated.   HEALTH MAINTENANCE: History  Substance Use Topics  . Smoking status: Never Smoker   . Smokeless tobacco: Never Used     Comment: Uses marijuana    . Alcohol Use: No     Comment: occasonal     Colonoscopy: 2011  PAP: s/p hyst  Bone density: Bone density scan on 01/18/2013 showed a T score of -1.5 (osteopenia)  Lipid panel: Not on file  No Known Allergies  Current Outpatient Prescriptions  Medication Sig Dispense Refill  . allopurinol (ZYLOPRIM) 100 MG tablet Take 100 mg by mouth daily.       Marland Kitchen anastrozole (ARIMIDEX) 1 MG tablet Take 1 tablet (1 mg total) by mouth daily.  30 tablet  12  . benazepril-hydrochlorthiazide (LOTENSIN HCT) 10-12.5 MG per tablet Take 1 tablet by mouth daily.      . Calcium Carbonate (CALCIUM 600 PO) Take 600 mg by mouth daily.      . Cholecalciferol (VITAMIN D-3) 1000 UNITS CAPS Take 1 capsule by mouth daily.      Marland Kitchen docusate sodium 100 MG CAPS Take 100 mg by mouth daily.  10 capsule  0  . famotidine (PEPCID) 20 MG tablet Take 20 mg by mouth 2 (two) times daily.      . pravastatin (PRAVACHOL) 80 MG tablet Take 80 mg by mouth daily.      . traMADol (ULTRAM) 50 MG tablet Take 50 mg by mouth 2 (two) times daily as needed.       . vitamin E 100 UNIT capsule Take 100 Units by mouth daily.       No current facility-administered medications for this visit.    OBJECTIVE: Middle-aged [place] American woman in no acute distress: Filed Vitals:   02/14/13 1113  BP: 147/95  Pulse: 98  Temp: 98.5 F (36.9 C)  Resp: 18     Body mass index is 38.36 kg/(m^2).     Filed Weights   02/14/13 1113  Weight:  196 lb 6.4 oz (89.086 kg)    ECOG FS: 1 - Symptomatic but completely ambulatory  General appearance: Alert, cooperative, well nourished, no apparent distress Head: Normocephalic, without obvious abnormality, atraumatic, numerous dental caries Eyes: Arcus senilis, PERRLA, EOMI Nose: Nares, septum and mucosa are normal, no drainage or sinus tenderness Neck: No adenopathy, supple, symmetrical, trachea midline, no tenderness Resp: Clear to auscultation bilaterally, no wheezes/rales/rhonchi Cardio: Regular rate and rhythm, S1, S2 normal, no murmur, click, rub or gallop, no edema Breasts:  Status post left breast mastectomy with reconstruction, left breast has well-healed surgical scars, bilateral axillary fullness  GI: Soft, distended, non-tender, normoactive bowel sounds, no organomegaly Skin: No rashes/lesions, skin warm and dry, no erythematous areas, no cyanosis  M/S:  Atraumatic, normal strength in all extremities, normal range of motion, no clubbing  Lymph nodes: Cervical, supraclavicular, and axillary nodes  normal Neurologic: Grossly normal, cranial nerves II through XII intact, alert and oriented x 3 Psych: Appropriate affect    LAB RESULTS: Lab Results  Component Value Date   WBC 5.5 02/07/2013   NEUTROABS 3.3 02/07/2013   HGB 13.6 02/07/2013   HCT 41.1 02/07/2013   MCV 91.7 02/07/2013   PLT 256 02/07/2013      Chemistry      Component Value Date/Time   NA 142 02/07/2013 1029   NA 142 12/16/2012 1136   K 3.3* 02/07/2013 1029   K 4.3 12/16/2012 1136   CL 101 12/16/2012 0605   CL 101 08/16/2012 1055   CO2 21* 02/07/2013 1029   CO2 23 12/16/2012 0605   BUN 12.9 02/07/2013 1029   BUN 18 12/16/2012 0605   CREATININE 1.0 02/07/2013 1029   CREATININE 0.96 12/16/2012 0605      Component Value Date/Time   CALCIUM 10.2 02/07/2013 1029   CALCIUM 9.7 12/16/2012 0605   ALKPHOS 84 02/07/2013 1029   ALKPHOS 74 12/16/2012 0605   AST 12 02/07/2013 1029   AST 18 12/16/2012 0605   ALT  10 02/07/2013 1029   ALT 18 12/16/2012 0605   BILITOT 0.64 02/07/2013 1029   BILITOT 0.6 12/16/2012 0605       STUDIES: 1.  The patient's last unilateral right digital screening mammogram on 01/04/2013 showed scattered areas of fibroglandular density.  There were no findings suspicious for malignancy.  No mammographic evidence of malignancy.  Negative.  2.  The patient's last bone density scan on 01/18/2013 showed a T score of -1.5 (osteopenia).    ASSESSMENT: Sandy Morales is a  62 y.o. [place], [place] woman:   (1)  Status post left mastectomy and sentinel lymph node sampling 04/27/2012 for a pTis pN1a, stage IIA, 7.5 cm invasive ductal carcinoma, estrogen and progesterone receptors both 100% positive, with an MIB-1 of 36%, and no HER-2 amplification, with 1/4 metastatic left axillary lymph nodes  She underwent breast reconstruction.  (2)  Started Anastrozole in 08/2012 May 2014, the plan being to continue antiestrogen therapy for 5 years.  (3) Osteopenia  (4) Mild hypokalemia   PLAN:  Ms. Bihl will continue antiestrogen therapy with Anastrozole 1 mg by mouth daily.  She states she does not need a refill of Anastrozole at this time.  She is tolerating Anastrozole well, with minimal side effects including hot flashes that she states are tolerable.  Ms. Rexroad was given a written prescription for post-mastectomy bras and breast prosthesis today.  For osteopenia, it was recommended to Mrs. Dunigan that she take calcium 600 mg by mouth daily, vitamin D3 1000 IUs by mouth daily and participate in weightbearing exercise (walking as tolerated) daily.  Ms. Date was asked to consume more potassium through her diet to correct her hypokalemia.  She was given a list of foods high in potassium to consume daily.  She was asked to have her potassium rechecked when she visits her primary care physician Dr. Concepcion Elk in the next couple of weeks.   We plan to see Ms.  Lasecki again in 6 months at which time we will check laboratories of CBC, CMP, and vitamin D level.  All questions answered.  Ms. Siguenza encouraged to contact us in the interim with any questions, concerns, or problems.  Larina Bras, NP-C 02/14/2013  4:47 PM

## 2013-08-06 ENCOUNTER — Other Ambulatory Visit: Payer: Self-pay | Admitting: *Deleted

## 2013-08-07 ENCOUNTER — Other Ambulatory Visit (HOSPITAL_BASED_OUTPATIENT_CLINIC_OR_DEPARTMENT_OTHER): Payer: Medicare Other

## 2013-08-07 ENCOUNTER — Encounter: Payer: Self-pay | Admitting: *Deleted

## 2013-08-07 ENCOUNTER — Other Ambulatory Visit: Payer: Self-pay | Admitting: *Deleted

## 2013-08-07 DIAGNOSIS — Z780 Asymptomatic menopausal state: Secondary | ICD-10-CM

## 2013-08-07 DIAGNOSIS — E559 Vitamin D deficiency, unspecified: Secondary | ICD-10-CM | POA: Insufficient documentation

## 2013-08-07 DIAGNOSIS — C50419 Malignant neoplasm of upper-outer quadrant of unspecified female breast: Secondary | ICD-10-CM

## 2013-08-07 LAB — CBC WITH DIFFERENTIAL/PLATELET
BASO%: 0.7 % (ref 0.0–2.0)
Basophils Absolute: 0 10*3/uL (ref 0.0–0.1)
EOS%: 6.2 % (ref 0.0–7.0)
Eosinophils Absolute: 0.3 10*3/uL (ref 0.0–0.5)
HCT: 39.8 % (ref 34.8–46.6)
HGB: 13.1 g/dL (ref 11.6–15.9)
LYMPH%: 37.2 % (ref 14.0–49.7)
MCH: 30.9 pg (ref 25.1–34.0)
MCHC: 33 g/dL (ref 31.5–36.0)
MCV: 93.9 fL (ref 79.5–101.0)
MONO#: 0.6 10*3/uL (ref 0.1–0.9)
MONO%: 10.5 % (ref 0.0–14.0)
NEUT#: 2.4 10*3/uL (ref 1.5–6.5)
NEUT%: 45.4 % (ref 38.4–76.8)
Platelets: 211 10*3/uL (ref 145–400)
RBC: 4.23 10*6/uL (ref 3.70–5.45)
RDW: 12.9 % (ref 11.2–14.5)
WBC: 5.3 10*3/uL (ref 3.9–10.3)
lymph#: 2 10*3/uL (ref 0.9–3.3)

## 2013-08-07 LAB — COMPREHENSIVE METABOLIC PANEL (CC13)
ALT: 23 U/L (ref 0–55)
AST: 19 U/L (ref 5–34)
Albumin: 4.3 g/dL (ref 3.5–5.0)
Alkaline Phosphatase: 113 U/L (ref 40–150)
Anion Gap: 12 mEq/L — ABNORMAL HIGH (ref 3–11)
BUN: 18.7 mg/dL (ref 7.0–26.0)
CO2: 22 mEq/L (ref 22–29)
Calcium: 10.4 mg/dL (ref 8.4–10.4)
Chloride: 108 mEq/L (ref 98–109)
Creatinine: 1.1 mg/dL (ref 0.6–1.1)
Glucose: 95 mg/dl (ref 70–140)
Potassium: 4.3 mEq/L (ref 3.5–5.1)
Sodium: 142 mEq/L (ref 136–145)
Total Bilirubin: 0.71 mg/dL (ref 0.20–1.20)
Total Protein: 7.5 g/dL (ref 6.4–8.3)

## 2013-08-08 LAB — VITAMIN D 25 HYDROXY (VIT D DEFICIENCY, FRACTURES): Vit D, 25-Hydroxy: 36 ng/mL (ref 30–89)

## 2013-08-10 ENCOUNTER — Telehealth: Payer: Self-pay | Admitting: *Deleted

## 2013-08-10 NOTE — Telephone Encounter (Signed)
Left message for pt to return my call so I can reschedule her appt.  

## 2013-08-14 ENCOUNTER — Telehealth: Payer: Self-pay | Admitting: Oncology

## 2013-08-14 ENCOUNTER — Ambulatory Visit (HOSPITAL_BASED_OUTPATIENT_CLINIC_OR_DEPARTMENT_OTHER): Payer: Medicare Other | Admitting: Oncology

## 2013-08-14 VITALS — BP 131/83 | HR 83 | Temp 99.0°F | Resp 18 | Ht 60.0 in | Wt 202.0 lb

## 2013-08-14 DIAGNOSIS — C50419 Malignant neoplasm of upper-outer quadrant of unspecified female breast: Secondary | ICD-10-CM

## 2013-08-14 DIAGNOSIS — C50919 Malignant neoplasm of unspecified site of unspecified female breast: Secondary | ICD-10-CM

## 2013-08-14 DIAGNOSIS — C773 Secondary and unspecified malignant neoplasm of axilla and upper limb lymph nodes: Secondary | ICD-10-CM

## 2013-08-14 DIAGNOSIS — M899 Disorder of bone, unspecified: Secondary | ICD-10-CM

## 2013-08-14 DIAGNOSIS — Z170 Estrogen receptor positive status [ER+]: Secondary | ICD-10-CM

## 2013-08-14 DIAGNOSIS — M949 Disorder of cartilage, unspecified: Secondary | ICD-10-CM

## 2013-08-14 MED ORDER — ANASTROZOLE 1 MG PO TABS: 1.0000 mg | ORAL_TABLET | Freq: Every day | ORAL | Status: DC

## 2013-08-14 NOTE — Telephone Encounter (Signed)
per pof to sch appt-printed & gace pt copy of sch

## 2013-08-14 NOTE — Addendum Note (Signed)
Addended by: Laureen Abrahams on: 08/14/2013 11:56 AM   Modules accepted: Medications

## 2013-08-14 NOTE — Progress Notes (Signed)
ID: Janan Ridge   DOB: [birth year]  MR#: [id number]  WUJ#:[id number]  PCP: Antonietta Jewel MD GYN:  SU: Erroll Luna OTHER MD: Dr. Aldona Bar, Dr Thea Silversmith, Crissie Reese  HISTORY OF PRESENT ILLNESS: Ms. Cronic had routine screening mammography at [facility] hospital 01/04/2012 showing heterogeneously dense breasts. In addition, in the left breast, new calcifications were noted. Left diagnostic mammography at the breast center 01/11/2012 confirmed amorphous calcifications in the left upper outer quadrant. Biopsy was performed 01/18/2012, and showed ([id number]) ductal carcinoma in situ, grade 3, strongly estrogen and progesterone receptor positive. The patient's subsequent history is as detailed below.  INTERVAL HISTORY: Leeza returns today for followup of her breast cancer. The interval history is unremarkable. Family is doing "fine". She is able to obtain the anastrozole for $2 a month.  REVIEW OF SYSTEMS: She denies any arthralgias or myalgias. She had a slight increase in hot flashes when she started the anastrozole but that has largely gone back to "baseline". She does have some joint pains "here and there" but they are not more persistent or intense than before. A detailed review of systems was otherwise noncontributory  PAST MEDICAL HISTORY: Past Medical History  Diagnosis Date  . Hypertension   . GERD (gastroesophageal reflux disease)   . Breast cancer   . Dyslipidemia   . Osteoarthritis   . Gout     PAST SURGICAL HISTORY: Past Surgical History  Procedure Laterality Date  . Knee surgery    . Abdominal hysterectomy    . Cyst excision      rt middle finger  . Mastectomy  04/27/2012     left breast  with tissue expander  . Mastectomy w/ sentinel node biopsy  04/27/2012    Procedure: MASTECTOMY WITH SENTINEL LYMPH NODE BIOPSY;  Surgeon: Joyice Faster. Cornett, MD;  Location: [place];  Service: General;  Laterality: Left;  LEFT SIMPLE MASTECTOMY; SENTINEL LYMPH NODE BIOPSY  .  Tissue expander placement  04/27/2012    Procedure: TISSUE EXPANDER;  Surgeon: Crissie Reese, MD;  Location: [place];  Service: Plastics;  Laterality: Left;  PLACEMENT OF LEFT BREAST TISSUE EXPANDER  . Incision and drainage of wound Left 12/16/2012    Procedure: INCISION AND DRAINAGE Hematoma IN CHEST with drain placement;  Surgeon: Crissie Reese, MD;  Location: [place];  Service: Plastics;  Laterality: Left;    FAMILY HISTORY Family History  Problem Relation Age of Onset  . Cancer Mother     Unknown type of cancer  . Lung cancer Father    the patient's father died from lung cancer in the setting of tobacco abuse. The patient's mother died possibly from cancer as well. The patient is not quite sure of the age of death of either of her parents. Ms. Nease had 3 brothers and 7 sisters. She is not aware of any breast or ovarian cancer history in the family.  GYNECOLOGIC HISTORY: First menstrual period age 60. First live birth age 21. She is GX P3. She underwent TAH-BSO in 1995. She never used hormone replacement.  SOCIAL HISTORY: (updated OCT 2013) Antwan worked for a [employer] for 32 years. 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 She has been widowed for approximately 11 years. She lives by herself. Her children are out Joanne Gavel, who is currently incarcerated; Aislinn Feliz, who lives in [place] and works in Architect; and Mikaili Flippin, who is disabled secondary to neuro fibromatosis. The patient has 16 grandchildren and 2  great-grandchildren. She attends a Physicist, medical church.   ADVANCED DIRECTIVES: Not in place; the patient tells me she has talked with their sons Timthony and Coralyn Mark and that they would make decisions together if she became mantle incapacitated.  HEALTH MAINTENANCE: History  Substance Use Topics  . Smoking status: Never Smoker   . Smokeless tobacco: Never Used     Comment: Uses marijuana    . Alcohol Use: No     Comment:  occasonal     Colonoscopy: 2011  PAP: s/p hyst  Bone density: never  Lipid panel:  No Known Allergies  Current Outpatient Prescriptions  Medication Sig Dispense Refill  . allopurinol (ZYLOPRIM) 100 MG tablet Take 100 mg by mouth daily.       Marland Kitchen anastrozole (ARIMIDEX) 1 MG tablet Take 1 tablet (1 mg total) by mouth daily.  30 tablet  12  . benazepril-hydrochlorthiazide (LOTENSIN HCT) 10-12.5 MG per tablet Take 1 tablet by mouth daily.      . Calcium Carbonate (CALCIUM 600 PO) Take 600 mg by mouth daily.      . Cholecalciferol (VITAMIN D-3) 1000 UNITS CAPS Take 1 capsule by mouth daily.      Marland Kitchen docusate sodium 100 MG CAPS Take 100 mg by mouth daily.  10 capsule  0  . famotidine (PEPCID) 20 MG tablet Take 20 mg by mouth 2 (two) times daily.      . pravastatin (PRAVACHOL) 80 MG tablet Take 80 mg by mouth daily.      . traMADol (ULTRAM) 50 MG tablet Take 50 mg by mouth 2 (two) times daily as needed.       . vitamin E 100 UNIT capsule Take 100 Units by mouth daily.       No current facility-administered medications for this visit.    OBJECTIVE: Middle-aged [place] American woman who appears well Filed Vitals:   08/14/13 1100  BP: 131/83  Pulse: 83  Temp: 99 F (37.2 C)  Resp: 18     Body mass index is 39.45 kg/(m^2).    ECOG FS: 0  Sclerae unicteric, pupils round and reactive Oropharynx clear and moist No cervical or supraclavicular adenopathy Lungs no rales or rhonchi Heart regular rate and rhythm Abd soft, nontender, positive bowel sounds MSK no focal spinal tenderness, no upper extremity lymphedema Neuro: nonfocal, well oriented, pleasant affect Breasts: The right breast is unremarkable. The left breast is status post mastectomy with implant reconstruction. There is no evidence of local recurrence. The left axilla is clear.  LAB RESULTS: Lab Results  Component Value Date   WBC 5.3 08/07/2013   NEUTROABS 2.4 08/07/2013   HGB 13.1 08/07/2013   HCT 39.8 08/07/2013   MCV 93.9  08/07/2013   PLT 211 08/07/2013      Chemistry      Component Value Date/Time   NA 142 08/07/2013 1315   NA 142 12/16/2012 1136   K 4.3 08/07/2013 1315   K 4.3 12/16/2012 1136   CL 101 12/16/2012 0605   CL 101 08/16/2012 1055   CO2 22 08/07/2013 1315   CO2 23 12/16/2012 0605   BUN 18.7 08/07/2013 1315   BUN 18 12/16/2012 0605   CREATININE 1.1 08/07/2013 1315   CREATININE 0.96 12/16/2012 0605      Component Value Date/Time   CALCIUM 10.4 08/07/2013 1315   CALCIUM 9.7 12/16/2012 0605   ALKPHOS 113 08/07/2013 1315   ALKPHOS 74 12/16/2012 0605   AST 19 08/07/2013 1315   AST 18 12/16/2012  0605   ALT 23 08/07/2013 1315   ALT 18 12/16/2012 0605   BILITOT 0.71 08/07/2013 1315   BILITOT 0.6 12/16/2012 0605       No results found for this basename: LABCA2    No components found with this basename: [id number]    No results found for this basename: INR,  in the last 168 hours  Urinalysis    Component Value Date/Time   COLORURINE YELLOW 12/16/2012 [place] 12/16/2012 0851   LABSPEC 1.016 12/16/2012 0851   PHURINE 6.5 12/16/2012 0851   GLUCOSEU NEGATIVE 12/16/2012 0851   HGBUR NEGATIVE 12/16/2012 0851   HGBUR negative 05/26/2010 [place] 12/16/2012 0851   KETONESUR NEGATIVE 12/16/2012 0851   PROTEINUR NEGATIVE 12/16/2012 0851   UROBILINOGEN 0.2 12/16/2012 0851   NITRITE NEGATIVE 12/16/2012 0851   LEUKOCYTESUR NEGATIVE 12/16/2012 0851    STUDIES: *RADIOLOGY REPORT*  Clinical Data: 63 year old postmenopausal female with history of  left upper outer quadrant breast cancer, estrogen deficiency,  hypertension, and GERD. The patient has taken Pepcid for 6-7  years. She is taking Arimidex.  DUAL X-RAY ABSORPTIOMETRY (DXA) FOR BONE MINERAL DENSITY October 2014 LEFT FEMUR (NECK)  Bone Mineral Density (BMD): 0.731 g/cm2  Young Adult T Score: -1.1  Z Score: -0.3  LEFT FOREARM (1/3 RADIUS)  Bone Mineral Density (BMD): 0.604 g/cm2  Young Adult T Score: -1.5  Z Score: 0.0   ASSESSMENT: Patient's diagnostic category is LOW BONE MASS by WHO  Criteria.  FRACTURE RISK: MODERATE  FRAX: Based on the Vernon model, the 10  year probability of a major osteoporotic fracture is 3.2%. The 10  year probability of a hip fracture is 0.2%.  Spine is excluded due to degenerative change.  Comparison: None.    ASSESSMENT: 63 y.o. [place] woman status post left mastectomy and sentinel lymph node sampling 04/27/2012 for a pTis pN1, stage IIA invasive ductal carcinoma, grade 2, estrogen and progesterone receptors both 100% positive, with an MIB-1 of 36%, and no HER-2 amplification.  (1) anastrozole started may 2014  (2) osteopenia, with bone density October 2014 showing a T score of -1.5  PLAN:  Bethania is tolerating the anastrozole fine. There has been no arthralgias or myalgias. The plan is going to be to continue for a total of 5 years. I refilled her prescription today.  We discussed the osteopenia. She will continue calcium and vitamin D supplementation and continue on her walking progression. We will repeat a bone density in 2 years.  She will see [place] again in 6 months. Her mammogram will be obtained a few weeks before the visit, in September of this year. She knows to call for any problems that may develop before the next visit. At that point we will likely start seeing her on a once a year basis Chauncey Cruel    08/14/2013

## 2014-01-07 ENCOUNTER — Other Ambulatory Visit: Payer: Self-pay | Admitting: Internal Medicine

## 2014-01-07 DIAGNOSIS — Z9012 Acquired absence of left breast and nipple: Secondary | ICD-10-CM

## 2014-01-07 DIAGNOSIS — Z1231 Encounter for screening mammogram for malignant neoplasm of breast: Secondary | ICD-10-CM

## 2014-01-07 DIAGNOSIS — Z853 Personal history of malignant neoplasm of breast: Secondary | ICD-10-CM

## 2014-01-17 ENCOUNTER — Ambulatory Visit: Payer: Self-pay

## 2014-01-18 ENCOUNTER — Ambulatory Visit
Admission: RE | Admit: 2014-01-18 | Discharge: 2014-01-18 | Disposition: A | Discharge status: Home / Self Care | Payer: Medicare Other | Source: Ambulatory Visit | Attending: Internal Medicine | Admitting: Internal Medicine

## 2014-01-18 DIAGNOSIS — Z9012 Acquired absence of left breast and nipple: Secondary | ICD-10-CM

## 2014-01-18 DIAGNOSIS — Z1231 Encounter for screening mammogram for malignant neoplasm of breast: Secondary | ICD-10-CM

## 2014-01-25 ENCOUNTER — Telehealth: Payer: Self-pay | Admitting: Oncology

## 2014-01-25 NOTE — Telephone Encounter (Signed)
per GM to move pt to HF sch-cld & spoke to pt and gave pt new time for appt-pt understood

## 2014-02-05 ENCOUNTER — Other Ambulatory Visit (HOSPITAL_BASED_OUTPATIENT_CLINIC_OR_DEPARTMENT_OTHER): Payer: Medicare Other

## 2014-02-05 DIAGNOSIS — C50919 Malignant neoplasm of unspecified site of unspecified female breast: Secondary | ICD-10-CM

## 2014-02-05 DIAGNOSIS — C773 Secondary and unspecified malignant neoplasm of axilla and upper limb lymph nodes: Secondary | ICD-10-CM

## 2014-02-05 DIAGNOSIS — C50419 Malignant neoplasm of upper-outer quadrant of unspecified female breast: Secondary | ICD-10-CM

## 2014-02-05 LAB — CBC WITH DIFFERENTIAL/PLATELET
BASO%: 0.7 % (ref 0.0–2.0)
Basophils Absolute: 0 10*3/uL (ref 0.0–0.1)
EOS%: 5.4 % (ref 0.0–7.0)
Eosinophils Absolute: 0.2 10*3/uL (ref 0.0–0.5)
HCT: 37.6 % (ref 34.8–46.6)
HGB: 11.9 g/dL (ref 11.6–15.9)
LYMPH%: 34.5 % (ref 14.0–49.7)
MCH: 30.4 pg (ref 25.1–34.0)
MCHC: 31.8 g/dL (ref 31.5–36.0)
MCV: 95.7 fL (ref 79.5–101.0)
MONO#: 0.5 10*3/uL (ref 0.1–0.9)
MONO%: 11 % (ref 0.0–14.0)
NEUT#: 2 10*3/uL (ref 1.5–6.5)
NEUT%: 48.4 % (ref 38.4–76.8)
Platelets: 209 10*3/uL (ref 145–400)
RBC: 3.93 10*6/uL (ref 3.70–5.45)
RDW: 13.2 % (ref 11.2–14.5)
WBC: 4.2 10*3/uL (ref 3.9–10.3)
lymph#: 1.4 10*3/uL (ref 0.9–3.3)

## 2014-02-05 LAB — COMPREHENSIVE METABOLIC PANEL (CC13)
ALT: 30 U/L (ref 0–55)
AST: 25 U/L (ref 5–34)
Albumin: 3.9 g/dL (ref 3.5–5.0)
Alkaline Phosphatase: 119 U/L (ref 40–150)
Anion Gap: 12 mEq/L — ABNORMAL HIGH (ref 3–11)
BUN: 14.5 mg/dL (ref 7.0–26.0)
CO2: 24 mEq/L (ref 22–29)
Calcium: 10.2 mg/dL (ref 8.4–10.4)
Chloride: 107 mEq/L (ref 98–109)
Creatinine: 1.3 mg/dL — ABNORMAL HIGH (ref 0.6–1.1)
Glucose: 109 mg/dl (ref 70–140)
Potassium: 4.3 mEq/L (ref 3.5–5.1)
Sodium: 142 mEq/L (ref 136–145)
Total Bilirubin: 0.78 mg/dL (ref 0.20–1.20)
Total Protein: 7.2 g/dL (ref 6.4–8.3)

## 2014-02-12 ENCOUNTER — Ambulatory Visit (HOSPITAL_BASED_OUTPATIENT_CLINIC_OR_DEPARTMENT_OTHER): Payer: Medicare Other | Admitting: Nurse Practitioner

## 2014-02-12 ENCOUNTER — Encounter: Payer: Self-pay | Admitting: Nurse Practitioner

## 2014-02-12 ENCOUNTER — Telehealth: Payer: Self-pay | Admitting: Nurse Practitioner

## 2014-02-12 ENCOUNTER — Ambulatory Visit: Payer: Self-pay | Admitting: Oncology

## 2014-02-12 VITALS — BP 117/77 | HR 89 | Temp 98.4°F | Resp 18 | Ht 60.0 in | Wt 195.1 lb

## 2014-02-12 DIAGNOSIS — Z853 Personal history of malignant neoplasm of breast: Secondary | ICD-10-CM | POA: Diagnosis not present

## 2014-02-12 DIAGNOSIS — R232 Flushing: Secondary | ICD-10-CM | POA: Insufficient documentation

## 2014-02-12 DIAGNOSIS — Z23 Encounter for immunization: Secondary | ICD-10-CM | POA: Diagnosis not present

## 2014-02-12 DIAGNOSIS — N951 Menopausal and female climacteric states: Secondary | ICD-10-CM

## 2014-02-12 DIAGNOSIS — M8580 Other specified disorders of bone density and structure, unspecified site: Secondary | ICD-10-CM | POA: Diagnosis not present

## 2014-02-12 DIAGNOSIS — C50912 Malignant neoplasm of unspecified site of left female breast: Secondary | ICD-10-CM

## 2014-02-12 DIAGNOSIS — C773 Secondary and unspecified malignant neoplasm of axilla and upper limb lymph nodes: Principal | ICD-10-CM

## 2014-02-12 MED ORDER — INFLUENZA VAC SPLIT QUAD 0.5 ML IM SUSY
0.5000 mL | PREFILLED_SYRINGE | Freq: Once | INTRAMUSCULAR | Status: AC
  Administered 2014-02-12: 0.5 mL via INTRAMUSCULAR
  Filled 2014-02-12: qty 0.5

## 2014-02-12 MED ORDER — GABAPENTIN 300 MG PO CAPS: 300.0000 mg | ORAL_CAPSULE | Freq: Every day | ORAL | Status: DC

## 2014-02-12 NOTE — Progress Notes (Signed)
ID: Janan Ridge   DOB: [birth year]  MR#: [id number]  JFH#:[id number]  PCP: Antonietta Jewel MD GYN:  SU: Erroll Luna OTHER MD: Dr. Aldona Bar, Dr Thea Silversmith, Crissie Reese  CHIEF COMPLAINT: left breast cancer CURRENT TREATMENT: anastrozole daily   BREAST CANCER HISTORY: Ms. North had routine screening mammography at [facility] hospital 01/04/2012 showing heterogeneously dense breasts. In addition, in the left breast, new calcifications were noted. Left diagnostic mammography at the breast center 01/11/2012 confirmed amorphous calcifications in the left upper outer quadrant. Biopsy was performed 01/18/2012, and showed ([id number]) ductal carcinoma in situ, grade 3, strongly estrogen and progesterone receptor positive. The patient's subsequent history is as detailed below.  INTERVAL HISTORY: Maleyah returns today for follow up of her breast cancer. She has been on anastrozole since May 2014 and is tolerating it moderately well. She has hot flashes daily, but they mainly occur at night and tend to wake her. She denies vaginal changes or arthralgias/myalgias besides her chronic knee pain secondary to gout. The interval history is unremarkable. 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 Occasionally she will take walks in her neighborhood.   REVIEW OF SYSTEMS: Dawnyel denies fevers, chills ,nausea, vomiting. She has some shortness of breath on exertion, but denies cough, palpitations, or fatigue. She has no headaches, dizziness, or unexplained weight loss. A detailed review of systems is otherwise noncontributory.   PAST MEDICAL HISTORY: Past Medical History  Diagnosis Date  . Hypertension   . GERD (gastroesophageal reflux disease)   . Breast cancer   . Dyslipidemia   . Osteoarthritis   . Gout     PAST SURGICAL HISTORY: Past Surgical History  Procedure Laterality Date  . Knee surgery    . Abdominal hysterectomy    . Cyst excision      rt middle finger  .  Mastectomy  04/27/2012     left breast  with tissue expander  . Mastectomy w/ sentinel node biopsy  04/27/2012    Procedure: MASTECTOMY WITH SENTINEL LYMPH NODE BIOPSY;  Surgeon: Joyice Faster. Cornett, MD;  Location: [place];  Service: General;  Laterality: Left;  LEFT SIMPLE MASTECTOMY; SENTINEL LYMPH NODE BIOPSY  . Tissue expander placement  04/27/2012    Procedure: TISSUE EXPANDER;  Surgeon: Crissie Reese, MD;  Location: [place];  Service: Plastics;  Laterality: Left;  PLACEMENT OF LEFT BREAST TISSUE EXPANDER  . Incision and drainage of wound Left 12/16/2012    Procedure: INCISION AND DRAINAGE Hematoma IN CHEST with drain placement;  Surgeon: Crissie Reese, MD;  Location: [place];  Service: Plastics;  Laterality: Left;    FAMILY HISTORY Family History  Problem Relation Age of Onset  . Cancer Mother     Unknown type of cancer  . Lung cancer Father    the patient's father died from lung cancer in the setting of tobacco abuse. The patient's mother died possibly from cancer as well. The patient is not quite sure of the age of death of either of her parents. Ms. Noell had 3 brothers and 7 sisters. She is not aware of any breast or ovarian cancer history in the family.  GYNECOLOGIC HISTORY: First menstrual period age 58. First live birth age 38. She is GX P3. She underwent TAH-BSO in 1995. She never used hormone replacement.  SOCIAL HISTORY: (updated OCT 2013) Aldona worked for a [employer] for 32 years. 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 She has been widowed  for approximately 11 years. She lives by herself. Her children are out Joanne Gavel, who is currently incarcerated; Starr Urias, who lives in [place] and works in Architect; and Nyasiah Moffet, who is disabled secondary to neuro fibromatosis. The patient has 16 grandchildren and 2 great-grandchildren. She attends a Physicist, medical church.   ADVANCED DIRECTIVES: Not in place; the patient tells me she  has talked with their sons Timthony and Coralyn Mark and that they would make decisions together if she became mantle incapacitated.  HEALTH MAINTENANCE: History  Substance Use Topics  . Smoking status: Never Smoker   . Smokeless tobacco: Never Used     Comment: Uses marijuana    . Alcohol Use: No     Comment: occasonal     Colonoscopy: 2011  PAP: s/p hyst  Bone density: never  Lipid panel:  No Known Allergies  Current Outpatient Prescriptions  Medication Sig Dispense Refill  . anastrozole (ARIMIDEX) 1 MG tablet Take 1 tablet (1 mg total) by mouth daily.  90 tablet  12  . benazepril-hydrochlorthiazide (LOTENSIN HCT) 10-12.5 MG per tablet Take 1 tablet by mouth daily.      . Calcium Carbonate (CALCIUM 600 PO) Take 600 mg by mouth daily.      . Cholecalciferol (VITAMIN D-3) 1000 UNITS CAPS Take 1 capsule by mouth daily.      . colchicine 0.6 MG tablet       . docusate sodium 100 MG CAPS Take 100 mg by mouth daily.  10 capsule  0  . famotidine (PEPCID) 20 MG tablet Take 20 mg by mouth 2 (two) times daily.      . meloxicam (MOBIC) 7.5 MG tablet Take by mouth 2 (two) times daily.       . pravastatin (PRAVACHOL) 80 MG tablet Take 80 mg by mouth daily.      . traMADol (ULTRAM) 50 MG tablet Take 50 mg by mouth 2 (two) times daily as needed.       . vitamin E 100 UNIT capsule Take 100 Units by mouth daily.      Marland Kitchen gabapentin (NEURONTIN) 300 MG capsule Take 1 capsule (300 mg total) by mouth at bedtime.  30 capsule  2   No current facility-administered medications for this visit.    OBJECTIVE: Middle-aged [place] American woman who appears well Filed Vitals:   02/12/14 1148  BP: 117/77  Pulse: 89  Temp: 98.4 F (36.9 C)  Resp: 18     Body mass index is 38.1 kg/(m^2).    ECOG FS: 0  Skin: warm, dry  HEENT: sclerae anicteric, conjunctivae pink, oropharynx clear. No thrush or mucositis.  Lymph Nodes: No cervical or supraclavicular lymphadenopathy  Lungs: clear to auscultation bilaterally,  no rales, wheezes, or rhonci  Heart: regular rate and rhythm  Abdomen: round, soft, non tender, positive bowel sounds  Musculoskeletal: No focal spinal tenderness, no peripheral edema  Neuro: non focal, well oriented, positive affect  Breasts: left breast status post mastectomy and reconstruction. No evidence of local recurrence. Left axilla benign. Right breast unremarkable.    LAB RESULTS: Lab Results  Component Value Date   WBC 4.2 02/05/2014   NEUTROABS 2.0 02/05/2014   HGB 11.9 02/05/2014   HCT 37.6 02/05/2014   MCV 95.7 02/05/2014   PLT 209 02/05/2014      Chemistry      Component Value Date/Time   NA 142 02/05/2014 1057   NA 142 12/16/2012 1136   K 4.3 02/05/2014 1057   K  4.3 12/16/2012 1136   CL 101 12/16/2012 0605   CL 101 08/16/2012 1055   CO2 24 02/05/2014 1057   CO2 23 12/16/2012 0605   BUN 14.5 02/05/2014 1057   BUN 18 12/16/2012 0605   CREATININE 1.3* 02/05/2014 1057   CREATININE 0.96 12/16/2012 0605      Component Value Date/Time   CALCIUM 10.2 02/05/2014 1057   CALCIUM 9.7 12/16/2012 0605   ALKPHOS 119 02/05/2014 1057   ALKPHOS 74 12/16/2012 0605   AST 25 02/05/2014 1057   AST 18 12/16/2012 0605   ALT 30 02/05/2014 1057   ALT 18 12/16/2012 0605   BILITOT 0.78 02/05/2014 1057   BILITOT 0.6 12/16/2012 0605       No results found for this basename: LABCA2    No components found with this basename: LABCA125    No results found for this basename: INR,  in the last 168 hours  Urinalysis    Component Value Date/Time   COLORURINE YELLOW 12/16/2012 [place] 12/16/2012 0851   LABSPEC 1.016 12/16/2012 [place] 6.5 12/16/2012 0851   GLUCOSEU NEGATIVE 12/16/2012 0851   HGBUR NEGATIVE 12/16/2012 0851   HGBUR negative 05/26/2010 [place] 12/16/2012 [place] 12/16/2012 0851   PROTEINUR NEGATIVE 12/16/2012 0851   UROBILINOGEN 0.2 12/16/2012 0851   NITRITE NEGATIVE 12/16/2012 0851   LEUKOCYTESUR NEGATIVE  12/16/2012 0851    STUDIES: Most recent mammogram on 01/18/14 was unremarkable.   Most recent bone density scan on 01/18/13 showed a t-score of -1.1 (osteopenia)  ASSESSMENT: 63 y.o. [place] woman status post left mastectomy and sentinel lymph node sampling 04/27/2012 for a pTis pN1, stage IIA invasive ductal carcinoma, grade 2, estrogen and progesterone receptors both 100% positive, with an MIB-1 of 36%, and no HER-2 amplification.  (1) anastrozole started may 2014  (2) osteopenia, with bone density October 2014 showing a T score of -1.1  PLAN:  Ines looks and feels well today. The labs were reviewed in detail and were stable. She is tolerating the anastrozole well and the plan is to continue it for 5 total years of anti-estrogen therapy. For her hot flashes, I have prescribed her gabapentin 387m QHS. We talked about the main side effect of drowsiness, which she welcomes as it will help her sleep at night.   MZaiawill return next year for labs and an office visit, after her mammogram is performed. She will also have a repeat bone density scan done before her next visit. She remains on vitamin D and calcium alone for treatment of her osteopenia. She understands and agrees with this plan. She knows the goal of treatment in her case is cure. She has been encouraged to call with any issues that might arise before her next visit here.   FMarcelino Duster   02/12/2014

## 2014-02-12 NOTE — Telephone Encounter (Signed)
, °

## 2014-05-01 ENCOUNTER — Emergency Department (HOSPITAL_COMMUNITY)
Admission: EM | Admit: 2014-05-01 | Discharge: 2014-05-01 | Disposition: A | Discharge status: Home / Self Care | Payer: Medicare HMO | Attending: Emergency Medicine | Admitting: Emergency Medicine

## 2014-05-01 ENCOUNTER — Encounter (HOSPITAL_COMMUNITY): Payer: Self-pay | Admitting: *Deleted

## 2014-05-01 DIAGNOSIS — Z791 Long term (current) use of non-steroidal anti-inflammatories (NSAID): Secondary | ICD-10-CM | POA: Diagnosis not present

## 2014-05-01 DIAGNOSIS — Z853 Personal history of malignant neoplasm of breast: Secondary | ICD-10-CM | POA: Diagnosis not present

## 2014-05-01 DIAGNOSIS — I10 Essential (primary) hypertension: Secondary | ICD-10-CM | POA: Diagnosis not present

## 2014-05-01 DIAGNOSIS — M109 Gout, unspecified: Secondary | ICD-10-CM | POA: Insufficient documentation

## 2014-05-01 DIAGNOSIS — M1990 Unspecified osteoarthritis, unspecified site: Secondary | ICD-10-CM | POA: Insufficient documentation

## 2014-05-01 DIAGNOSIS — E785 Hyperlipidemia, unspecified: Secondary | ICD-10-CM | POA: Insufficient documentation

## 2014-05-01 DIAGNOSIS — K219 Gastro-esophageal reflux disease without esophagitis: Secondary | ICD-10-CM | POA: Diagnosis not present

## 2014-05-01 DIAGNOSIS — Z79899 Other long term (current) drug therapy: Secondary | ICD-10-CM | POA: Insufficient documentation

## 2014-05-01 DIAGNOSIS — R21 Rash and other nonspecific skin eruption: Secondary | ICD-10-CM | POA: Insufficient documentation

## 2014-05-01 MED ORDER — DIPHENHYDRAMINE HCL 25 MG PO CAPS
25.0000 mg | ORAL_CAPSULE | Freq: Once | ORAL | Status: AC
  Administered 2014-05-01: 25 mg via ORAL
  Filled 2014-05-01: qty 1

## 2014-05-01 MED ORDER — PREDNISONE 20 MG PO TABS
60.0000 mg | ORAL_TABLET | Freq: Once | ORAL | Status: AC
  Administered 2014-05-01: 60 mg via ORAL
  Filled 2014-05-01: qty 3

## 2014-05-01 MED ORDER — PREDNISONE 10 MG PO TABS: 20.0000 mg | ORAL_TABLET | Freq: Every day | ORAL | Status: DC

## 2014-05-01 NOTE — ED Notes (Signed)
Pt reports having a rash to left breast x 2 weeks and has itching to entire body. Reports recent changes in her medications. No acute distress noted at triage.

## 2014-05-01 NOTE — Discharge Instructions (Signed)
Take benadryl for itching and follow up next week with your md

## 2014-05-01 NOTE — ED Provider Notes (Signed)
CSN: [id number]     Arrival date & time 05/01/14  1054 History   First MD Initiated Contact with Patient 05/01/14 1102     Chief Complaint  Patient presents with  . Rash     (Consider location/radiation/quality/duration/timing/severity/associated sxs/prior Treatment) Patient is a 64 y.o. female presenting with rash. The history is provided by the patient (the pt has a rash to her left and right chest.  the rash is puritic).  Rash Location: chest. Quality: itchiness   Severity:  Moderate Onset quality:  Gradual Timing:  Constant Progression:  Spreading Chronicity:  New Context: not animal contact   Associated symptoms: no abdominal pain, no diarrhea, no fatigue and no headaches     Past Medical History  Diagnosis Date  . Hypertension   . GERD (gastroesophageal reflux disease)   . Breast cancer   . Dyslipidemia   . Osteoarthritis   . Gout    Past Surgical History  Procedure Laterality Date  . Knee surgery    . Abdominal hysterectomy    . Cyst excision      rt middle finger  . Mastectomy  04/27/2012     left breast  with tissue expander  . Mastectomy w/ sentinel node biopsy  04/27/2012    Procedure: MASTECTOMY WITH SENTINEL LYMPH NODE BIOPSY;  Surgeon: Joyice Faster. Cornett, MD;  Location: [place];  Service: General;  Laterality: Left;  LEFT SIMPLE MASTECTOMY; SENTINEL LYMPH NODE BIOPSY  . Tissue expander placement  04/27/2012    Procedure: TISSUE EXPANDER;  Surgeon: Crissie Reese, MD;  Location: [place];  Service: Plastics;  Laterality: Left;  PLACEMENT OF LEFT BREAST TISSUE EXPANDER  . Incision and drainage of wound Left 12/16/2012    Procedure: INCISION AND DRAINAGE Hematoma IN CHEST with drain placement;  Surgeon: Crissie Reese, MD;  Location: [place];  Service: Plastics;  Laterality: Left;   Family History  Problem Relation Age of Onset  . Cancer Mother     Unknown type of cancer  . Lung cancer Father    History  Substance Use Topics  . Smoking status: Never Smoker   .  Smokeless tobacco: Never Used     Comment: Uses marijuana    . Alcohol Use: No     Comment: occasonal   OB History    No data available     Review of Systems  Constitutional: Negative for appetite change and fatigue.  HENT: Negative for congestion, ear discharge and sinus pressure.   Eyes: Negative for discharge.  Respiratory: Negative for cough.   Cardiovascular: Negative for chest pain.  Gastrointestinal: Negative for abdominal pain and diarrhea.  Genitourinary: Negative for frequency and hematuria.  Musculoskeletal: Negative for back pain.  Skin: Positive for rash.  Neurological: Negative for seizures and headaches.  Psychiatric/Behavioral: Negative for hallucinations.      Allergies  Review of patient's allergies indicates no known allergies.  Home Medications   Prior to Admission medications   Medication Sig Start Date End Date Taking? Authorizing Provider  anastrozole (ARIMIDEX) 1 MG tablet Take 1 tablet (1 mg total) by mouth daily. 08/14/13  Yes Chauncey Cruel, MD  benazepril-hydrochlorthiazide (LOTENSIN HCT) 10-12.5 MG per tablet Take 1 tablet by mouth daily.   Yes Historical Provider, MD  Calcium Carbonate (CALCIUM 600 PO) Take 600 mg by mouth daily.   Yes Historical Provider, MD  Cholecalciferol (VITAMIN D-3) 1000 UNITS CAPS Take 1 capsule by mouth daily.   Yes Historical Provider, MD  colchicine 0.6 MG tablet Take  0.6 mg by mouth daily.  02/06/14  Yes Historical Provider, MD  docusate sodium 100 MG CAPS Take 100 mg by mouth daily. 12/18/12  Yes Crissie Reese, MD  famotidine (PEPCID) 20 MG tablet Take 20 mg by mouth 2 (two) times daily.   Yes Historical Provider, MD  gabapentin (NEURONTIN) 300 MG capsule Take 1 capsule (300 mg total) by mouth at bedtime. 02/12/14  Yes Marcelino Duster, NP  meloxicam (MOBIC) 7.5 MG tablet Take by mouth 2 (two) times daily.  08/11/13  Yes Historical Provider, MD  pravastatin (PRAVACHOL) 80 MG tablet Take 80 mg by mouth daily.   Yes  Historical Provider, MD  traMADol (ULTRAM) 50 MG tablet Take 50 mg by mouth 2 (two) times daily as needed.  02/08/13  Yes Historical Provider, MD  vitamin E 100 UNIT capsule Take 100 Units by mouth daily.   Yes Historical Provider, MD  predniSONE (DELTASONE) 10 MG tablet Take 2 tablets (20 mg total) by mouth daily. 05/01/14   Maudry Diego, MD   BP 130/85 mmHg  Pulse 81  Temp(Src) 97.8 F (36.6 C) (Oral)  Resp 18  SpO2 100% Physical Exam  Constitutional: She is oriented to person, place, and time. She appears well-developed.  HENT:  Head: Normocephalic.  Eyes: Conjunctivae and EOM are normal. No scleral icterus.  Neck: Neck supple. No thyromegaly present.  Cardiovascular: Normal rate and regular rhythm.  Exam reveals no gallop and no friction rub.   No murmur heard. Pulmonary/Chest: No stridor. She has no wheezes. She has no rales. She exhibits no tenderness.  Abdominal: She exhibits no distension. There is no tenderness. There is no rebound.  Musculoskeletal: Normal range of motion. She exhibits no edema.  Lymphadenopathy:    She has no cervical adenopathy.  Neurological: She is oriented to person, place, and time. She exhibits normal muscle tone. Coordination normal.  Skin: Rash noted. No erythema.  Mac pap rash to chest mostly left chest.  Appears allergic  Psychiatric: She has a normal mood and affect. Her behavior is normal.    ED Course  Procedures (including critical care time) Labs Review Labs Reviewed - No data to display  Imaging Review No results found.   EKG Interpretation None      MDM   Final diagnoses:  Rash    Allergic dermatitis,   tx with prednisone and benadryl    Maudry Diego, MD 05/01/14 1222

## 2014-05-24 ENCOUNTER — Inpatient Hospital Stay (HOSPITAL_COMMUNITY)
Admission: EM | Admit: 2014-05-24 | Discharge: 2014-05-26 | DRG: 392 | Disposition: A | Discharge status: Home / Self Care | Payer: Medicare HMO | Attending: Internal Medicine | Admitting: Internal Medicine

## 2014-05-24 ENCOUNTER — Encounter (HOSPITAL_COMMUNITY): Payer: Self-pay | Admitting: *Deleted

## 2014-05-24 ENCOUNTER — Emergency Department (HOSPITAL_COMMUNITY): Payer: Medicare HMO

## 2014-05-24 DIAGNOSIS — N179 Acute kidney failure, unspecified: Secondary | ICD-10-CM | POA: Diagnosis present

## 2014-05-24 DIAGNOSIS — K529 Noninfective gastroenteritis and colitis, unspecified: Secondary | ICD-10-CM | POA: Diagnosis not present

## 2014-05-24 DIAGNOSIS — E860 Dehydration: Secondary | ICD-10-CM | POA: Diagnosis present

## 2014-05-24 DIAGNOSIS — Z7952 Long term (current) use of systemic steroids: Secondary | ICD-10-CM

## 2014-05-24 DIAGNOSIS — R112 Nausea with vomiting, unspecified: Secondary | ICD-10-CM | POA: Diagnosis present

## 2014-05-24 DIAGNOSIS — N951 Menopausal and female climacteric states: Secondary | ICD-10-CM | POA: Diagnosis present

## 2014-05-24 DIAGNOSIS — R1110 Vomiting, unspecified: Secondary | ICD-10-CM

## 2014-05-24 DIAGNOSIS — E876 Hypokalemia: Secondary | ICD-10-CM | POA: Diagnosis present

## 2014-05-24 DIAGNOSIS — Z9882 Breast implant status: Secondary | ICD-10-CM

## 2014-05-24 DIAGNOSIS — M109 Gout, unspecified: Secondary | ICD-10-CM | POA: Diagnosis present

## 2014-05-24 DIAGNOSIS — F1290 Cannabis use, unspecified, uncomplicated: Secondary | ICD-10-CM | POA: Diagnosis present

## 2014-05-24 DIAGNOSIS — Z853 Personal history of malignant neoplasm of breast: Secondary | ICD-10-CM

## 2014-05-24 DIAGNOSIS — K219 Gastro-esophageal reflux disease without esophagitis: Secondary | ICD-10-CM | POA: Diagnosis present

## 2014-05-24 DIAGNOSIS — I10 Essential (primary) hypertension: Secondary | ICD-10-CM | POA: Diagnosis present

## 2014-05-24 DIAGNOSIS — E785 Hyperlipidemia, unspecified: Secondary | ICD-10-CM | POA: Diagnosis present

## 2014-05-24 DIAGNOSIS — M1990 Unspecified osteoarthritis, unspecified site: Secondary | ICD-10-CM | POA: Diagnosis present

## 2014-05-24 DIAGNOSIS — Z90710 Acquired absence of both cervix and uterus: Secondary | ICD-10-CM

## 2014-05-24 DIAGNOSIS — R9389 Abnormal findings on diagnostic imaging of other specified body structures: Secondary | ICD-10-CM

## 2014-05-24 LAB — CBC WITH DIFFERENTIAL/PLATELET
Basophils Absolute: 0 10*3/uL (ref 0.0–0.1)
Basophils Relative: 0 % (ref 0–1)
Eosinophils Absolute: 1 10*3/uL — ABNORMAL HIGH (ref 0.0–0.7)
Eosinophils Relative: 13 % — ABNORMAL HIGH (ref 0–5)
HCT: 36.7 % (ref 36.0–46.0)
Hemoglobin: 12.4 g/dL (ref 12.0–15.0)
Lymphocytes Relative: 42 % (ref 12–46)
Lymphs Abs: 3.2 10*3/uL (ref 0.7–4.0)
MCH: 31.1 pg (ref 26.0–34.0)
MCHC: 33.8 g/dL (ref 30.0–36.0)
MCV: 92 fL (ref 78.0–100.0)
Monocytes Absolute: 0.3 10*3/uL (ref 0.1–1.0)
Monocytes Relative: 4 % (ref 3–12)
Neutro Abs: 3.2 10*3/uL (ref 1.7–7.7)
Neutrophils Relative %: 41 % — ABNORMAL LOW (ref 43–77)
Platelets: 146 10*3/uL — ABNORMAL LOW (ref 150–400)
RBC: 3.99 MIL/uL (ref 3.87–5.11)
RDW: 13.1 % (ref 11.5–15.5)
WBC: 7.7 10*3/uL (ref 4.0–10.5)

## 2014-05-24 LAB — URINALYSIS, ROUTINE W REFLEX MICROSCOPIC
Bilirubin Urine: NEGATIVE
Glucose, UA: NEGATIVE mg/dL
Hgb urine dipstick: NEGATIVE
Ketones, ur: NEGATIVE mg/dL
Nitrite: NEGATIVE
Protein, ur: NEGATIVE mg/dL
Specific Gravity, Urine: 1.018 (ref 1.005–1.030)
Urobilinogen, UA: 0.2 mg/dL (ref 0.0–1.0)
pH: 5 (ref 5.0–8.0)

## 2014-05-24 LAB — BASIC METABOLIC PANEL
Anion gap: 10 (ref 5–15)
BUN: 18 mg/dL (ref 6–23)
CO2: 24 mmol/L (ref 19–32)
Calcium: 9.1 mg/dL (ref 8.4–10.5)
Chloride: 106 mmol/L (ref 96–112)
Creatinine, Ser: 1.34 mg/dL — ABNORMAL HIGH (ref 0.50–1.10)
GFR calc Af Amer: 47 mL/min — ABNORMAL LOW (ref >90)
GFR calc non Af Amer: 41 mL/min — ABNORMAL LOW (ref >90)
Glucose, Bld: 146 mg/dL — ABNORMAL HIGH (ref 70–99)
Potassium: 3.3 mmol/L — ABNORMAL LOW (ref 3.5–5.1)
Sodium: 140 mmol/L (ref 135–145)

## 2014-05-24 LAB — HEPATIC FUNCTION PANEL
ALT: 54 U/L — ABNORMAL HIGH (ref 0–35)
AST: 47 U/L — ABNORMAL HIGH (ref 0–37)
Albumin: 3.8 g/dL (ref 3.5–5.2)
Alkaline Phosphatase: 106 U/L (ref 39–117)
Bilirubin, Direct: 0.3 mg/dL (ref 0.0–0.5)
Indirect Bilirubin: 0.9 mg/dL (ref 0.3–0.9)
Total Bilirubin: 1.2 mg/dL (ref 0.3–1.2)
Total Protein: 6.6 g/dL (ref 6.0–8.3)

## 2014-05-24 LAB — URINE MICROSCOPIC-ADD ON

## 2014-05-24 LAB — TROPONIN I: Troponin I: <0.03 ng/mL (ref <0.031)

## 2014-05-24 LAB — LIPASE, BLOOD: Lipase: 24 U/L (ref 11–59)

## 2014-05-24 LAB — I-STAT CG4 LACTIC ACID, ED: Lactic Acid, Venous: 3.62 mmol/L (ref 0.5–2.0)

## 2014-05-24 MED ORDER — ONDANSETRON 4 MG PO TBDP
8.0000 mg | ORAL_TABLET | Freq: Once | ORAL | Status: AC
  Administered 2014-05-24: 8 mg via ORAL
  Filled 2014-05-24: qty 2

## 2014-05-24 MED ORDER — DIPHENHYDRAMINE HCL 50 MG/ML IJ SOLN
12.5000 mg | Freq: Once | INTRAMUSCULAR | Status: AC
  Administered 2014-05-24: 12.5 mg via INTRAVENOUS
  Filled 2014-05-24: qty 1

## 2014-05-24 MED ORDER — ONDANSETRON HCL 4 MG PO TABS: 4.0000 mg | ORAL_TABLET | Freq: Four times a day (QID) | ORAL | Status: DC

## 2014-05-24 MED ORDER — METOCLOPRAMIDE HCL 5 MG/ML IJ SOLN
5.0000 mg | Freq: Once | INTRAMUSCULAR | Status: AC
  Administered 2014-05-24: 5 mg via INTRAVENOUS
  Filled 2014-05-24: qty 2

## 2014-05-24 MED ORDER — SODIUM CHLORIDE 0.9 % IV BOLUS (SEPSIS)
500.0000 mL | Freq: Once | INTRAVENOUS | Status: AC
  Administered 2014-05-24: 500 mL via INTRAVENOUS

## 2014-05-24 NOTE — ED Notes (Signed)
Sandy Holiday, MD and [place], RN are at bedside at this time; family is in the room also

## 2014-05-24 NOTE — ED Notes (Signed)
During orthostatic vitals, when pt attempted to stand pt immediatly began to throw up.

## 2014-05-24 NOTE — ED Notes (Signed)
Pt is aware urine is needed. Pt does not have to urinate at this time.

## 2014-05-24 NOTE — ED Notes (Signed)
The pt is c/o voimiting no diarrhea since yesterday.  No pain

## 2014-05-24 NOTE — Discharge Instructions (Signed)

## 2014-05-24 NOTE — ED Provider Notes (Addendum)
CSN: [id number]     Arrival date & time 05/24/14  1520 History   First MD Initiated Contact with Patient 05/24/14 1836     Chief Complaint  Patient presents with  . Emesis     (Consider location/radiation/quality/duration/timing/severity/associated sxs/prior Treatment) HPI Comments: Patient presents to the ER for evaluation of nausea and vomiting. Symptoms began at approximately 2:30 PM. Patient reports severe nausea and vomiting, has not been able to hold anything down. She is feeling very weak. Patient denies any diarrhea. She has not had fever. There is no chest pain, shortness of breath or abdominal pain.  Patient is a 64 y.o. female presenting with vomiting.  Emesis   Past Medical History  Diagnosis Date  . Hypertension   . GERD (gastroesophageal reflux disease)   . Breast cancer   . Dyslipidemia   . Osteoarthritis   . Gout    Past Surgical History  Procedure Laterality Date  . Knee surgery    . Abdominal hysterectomy    . Cyst excision      rt middle finger  . Mastectomy  04/27/2012     left breast  with tissue expander  . Mastectomy w/ sentinel node biopsy  04/27/2012    Procedure: MASTECTOMY WITH SENTINEL LYMPH NODE BIOPSY;  Surgeon: Joyice Faster. Cornett, MD;  Location: [place];  Service: General;  Laterality: Left;  LEFT SIMPLE MASTECTOMY; SENTINEL LYMPH NODE BIOPSY  . Tissue expander placement  04/27/2012    Procedure: TISSUE EXPANDER;  Surgeon: Crissie Reese, MD;  Location: [place];  Service: Plastics;  Laterality: Left;  PLACEMENT OF LEFT BREAST TISSUE EXPANDER  . Incision and drainage of wound Left 12/16/2012    Procedure: INCISION AND DRAINAGE Hematoma IN CHEST with drain placement;  Surgeon: Crissie Reese, MD;  Location: [place];  Service: Plastics;  Laterality: Left;   Family History  Problem Relation Age of Onset  . Cancer Mother     Unknown type of cancer  . Lung cancer Father    History  Substance Use Topics  . Smoking status: Never Smoker   . Smokeless tobacco:  Never Used     Comment: Uses marijuana    . Alcohol Use: No     Comment: occasonal   OB History    No data available     Review of Systems  Gastrointestinal: Positive for nausea and vomiting.  All other systems reviewed and are negative.     Allergies  Review of patient's allergies indicates no known allergies.  Home Medications   Prior to Admission medications   Medication Sig Start Date End Date Taking? Authorizing Provider  anastrozole (ARIMIDEX) 1 MG tablet Take 1 tablet (1 mg total) by mouth daily. 08/14/13   Chauncey Cruel, MD  benazepril-hydrochlorthiazide (LOTENSIN HCT) 10-12.5 MG per tablet Take 1 tablet by mouth daily.    Historical Provider, MD  Calcium Carbonate (CALCIUM 600 PO) Take 600 mg by mouth daily.    Historical Provider, MD  Cholecalciferol (VITAMIN D-3) 1000 UNITS CAPS Take 1 capsule by mouth daily.    Historical Provider, MD  colchicine 0.6 MG tablet Take 0.6 mg by mouth daily.  02/06/14   Historical Provider, MD  docusate sodium 100 MG CAPS Take 100 mg by mouth daily. 12/18/12   Crissie Reese, MD  famotidine (PEPCID) 20 MG tablet Take 20 mg by mouth 2 (two) times daily.    Historical Provider, MD  gabapentin (NEURONTIN) 300 MG capsule Take 1 capsule (300 mg total) by mouth at  bedtime. 02/12/14   Marcelino Duster, NP  meloxicam (MOBIC) 7.5 MG tablet Take by mouth 2 (two) times daily.  08/11/13   Historical Provider, MD  pravastatin (PRAVACHOL) 80 MG tablet Take 80 mg by mouth daily.    Historical Provider, MD  predniSONE (DELTASONE) 10 MG tablet Take 2 tablets (20 mg total) by mouth daily. 05/01/14   Maudry Diego, MD  traMADol (ULTRAM) 50 MG tablet Take 50 mg by mouth 2 (two) times daily as needed.  02/08/13   Historical Provider, MD  vitamin E 100 UNIT capsule Take 100 Units by mouth daily.    Historical Provider, MD   BP 101/68 mmHg  Pulse 51  Temp(Src) 97.6 F (36.4 C)  Resp 20  Ht 5' (1.524 m)  Wt 186 lb (84.369 kg)  BMI 36.33 kg/m2  SpO2  100% Physical Exam  Constitutional: She is oriented to person, place, and time. She appears well-developed and well-nourished. No distress.  HENT:  Head: Normocephalic and atraumatic.  Right Ear: Hearing normal.  Left Ear: Hearing normal.  Nose: Nose normal.  Mouth/Throat: Oropharynx is clear and moist and mucous membranes are normal.  Eyes: Conjunctivae and EOM are normal. Pupils are equal, round, and reactive to light.  Neck: Normal range of motion. Neck supple.  Cardiovascular: Regular rhythm, S1 normal and S2 normal.  Exam reveals no gallop and no friction rub.   No murmur heard. Pulmonary/Chest: Effort normal and breath sounds normal. No respiratory distress. She exhibits no tenderness.  Abdominal: Soft. Normal appearance and bowel sounds are normal. There is no hepatosplenomegaly. There is no tenderness. There is no rebound, no guarding, no tenderness at McBurney's point and negative Murphy's sign. No hernia.  Musculoskeletal: Normal range of motion.  Neurological: She is alert and oriented to person, place, and time. She has normal strength. No cranial nerve deficit or sensory deficit. Coordination normal. GCS eye subscore is 4. GCS verbal subscore is 5. GCS motor subscore is 6.  Skin: Skin is warm, dry and intact. No rash noted. No cyanosis.  Psychiatric: She has a normal mood and affect. Her speech is normal and behavior is normal. Thought content normal.  Nursing note and vitals reviewed.   ED Course  Procedures (including critical care time) Labs Review Labs Reviewed  CBC WITH DIFFERENTIAL/PLATELET - Abnormal; Notable for the following:    Platelets 146 (*)    Neutrophils Relative % 41 (*)    Eosinophils Relative 13 (*)    Eosinophils Absolute 1.0 (*)    All other components within normal limits  BASIC METABOLIC PANEL - Abnormal; Notable for the following:    Potassium 3.3 (*)    Glucose, Bld 146 (*)    Creatinine, Ser 1.34 (*)    GFR calc non Af Amer 41 (*)    GFR  calc Af Amer 47 (*)    All other components within normal limits  HEPATIC FUNCTION PANEL - Abnormal; Notable for the following:    AST 47 (*)    ALT 54 (*)    All other components within normal limits  URINALYSIS, ROUTINE W REFLEX MICROSCOPIC - Abnormal; Notable for the following:    Leukocytes, UA SMALL (*)    All other components within normal limits  URINE MICROSCOPIC-ADD ON - Abnormal; Notable for the following:    Squamous Epithelial / LPF FEW (*)    Bacteria, UA FEW (*)    Casts GRANULAR CAST (*)    All other components within normal limits  I-STAT CG4 LACTIC ACID, ED - Abnormal; Notable for the following:    Lactic Acid, Venous 3.62 (*)    All other components within normal limits  TROPONIN I  LIPASE, BLOOD    Imaging Review Dg Abd Acute W/chest  05/24/2014   CLINICAL DATA:  Vomiting and weakness.  EXAM: ACUTE ABDOMEN SERIES (ABDOMEN 2 VIEW & CHEST 1 VIEW)  COMPARISON:  Chest x-ray 12/16/2012  FINDINGS: The upright chest x-ray demonstrates stable cardiac enlargement with prominent mediastinal and hilar contours. No infiltrates or effusions.  Two views of the abdomen demonstrate a paucity of bowel gas. There is some scattered air and stool in a colon and a few scattered air-filled loops of small bowel. No definite air-fluid levels or free air. The soft tissue shadows are grossly maintained. No worrisome calcifications. The bony structures are intact.  IMPRESSION: Stable cardiac enlargement with prominent mediastinal and hilar contours but no acute pulmonary findings other than streaky basilar atelectasis.  No plain film findings for an acute abdominal process.   Electronically Signed   By: Kalman Jewels M.D.   On: 05/24/2014 19:47     EKG Interpretation   Date/Time:  Friday May 24 2014 18:59:22 EST Ventricular Rate:  61 PR Interval:  189 QRS Duration: 94 QT Interval:  467 QTC Calculation: 470 R Axis:   17 Text Interpretation:  Sinus rhythm Atrial premature complex  Consider left  atrial enlargement Probable lateral infarct, old Non-specific ST-t changes  No significant change since last tracing Confirmed by Oden Lindaman  MD,  [place] [phone]) on 05/24/2014 7:02:52 PM      MDM   Final diagnoses:  Vomiting    Patient presented to the ER for evaluation of painless nausea and vomiting which onset yesterday. Patient is not experiencing any chest pain or shortness of breath. Her cardiac evaluation is unremarkable. Lab work is unremarkable. She does not have leukocytosis. Electrolytes, normal except for very slight hypokalemia. Liver function tests are normal. No evidence of anemia. She did have a slight lactic acidosis, likely secondary to the vomiting. Abdominal exam is benign. She has no tenderness to palpation. No concern for mesenteric or bowel ischemia. Patient's x-ray is unremarkable. She was treated with fluids, Zofran, Reglan with improvement of her symptoms. She cannot however, ambulate. When she sits up or stands up, she becomes nauseated and vomits. Her legs give out from under her and she cannot bear her own weight. When she lies back in the bed, however, she is symptom-free. Based on this, we'll asked the hospitalist to admit her for further hydration and monitoring.   Orpah Greek, MD 05/24/14 8295  Orpah Greek, MD 05/25/14 [phone]

## 2014-05-24 NOTE — ED Notes (Signed)
Dr Betsey Holiday given a copy of lactic acid results 3.62

## 2014-05-25 ENCOUNTER — Inpatient Hospital Stay (HOSPITAL_COMMUNITY): Payer: Medicare HMO

## 2014-05-25 ENCOUNTER — Encounter (HOSPITAL_COMMUNITY): Payer: Self-pay | Admitting: Internal Medicine

## 2014-05-25 DIAGNOSIS — E876 Hypokalemia: Secondary | ICD-10-CM | POA: Diagnosis present

## 2014-05-25 DIAGNOSIS — E860 Dehydration: Secondary | ICD-10-CM

## 2014-05-25 DIAGNOSIS — R112 Nausea with vomiting, unspecified: Secondary | ICD-10-CM | POA: Diagnosis present

## 2014-05-25 DIAGNOSIS — Z853 Personal history of malignant neoplasm of breast: Secondary | ICD-10-CM | POA: Diagnosis not present

## 2014-05-25 DIAGNOSIS — K529 Noninfective gastroenteritis and colitis, unspecified: Secondary | ICD-10-CM | POA: Diagnosis present

## 2014-05-25 DIAGNOSIS — I10 Essential (primary) hypertension: Secondary | ICD-10-CM

## 2014-05-25 DIAGNOSIS — E785 Hyperlipidemia, unspecified: Secondary | ICD-10-CM | POA: Diagnosis present

## 2014-05-25 DIAGNOSIS — M1990 Unspecified osteoarthritis, unspecified site: Secondary | ICD-10-CM | POA: Diagnosis present

## 2014-05-25 DIAGNOSIS — R1110 Vomiting, unspecified: Secondary | ICD-10-CM | POA: Diagnosis present

## 2014-05-25 DIAGNOSIS — R9389 Abnormal findings on diagnostic imaging of other specified body structures: Secondary | ICD-10-CM | POA: Insufficient documentation

## 2014-05-25 DIAGNOSIS — Z9882 Breast implant status: Secondary | ICD-10-CM | POA: Diagnosis not present

## 2014-05-25 DIAGNOSIS — M109 Gout, unspecified: Secondary | ICD-10-CM | POA: Diagnosis present

## 2014-05-25 DIAGNOSIS — Z90710 Acquired absence of both cervix and uterus: Secondary | ICD-10-CM | POA: Diagnosis not present

## 2014-05-25 DIAGNOSIS — K219 Gastro-esophageal reflux disease without esophagitis: Secondary | ICD-10-CM | POA: Diagnosis present

## 2014-05-25 DIAGNOSIS — N179 Acute kidney failure, unspecified: Secondary | ICD-10-CM | POA: Diagnosis present

## 2014-05-25 DIAGNOSIS — Z7952 Long term (current) use of systemic steroids: Secondary | ICD-10-CM | POA: Diagnosis not present

## 2014-05-25 DIAGNOSIS — F1290 Cannabis use, unspecified, uncomplicated: Secondary | ICD-10-CM | POA: Diagnosis present

## 2014-05-25 DIAGNOSIS — N951 Menopausal and female climacteric states: Secondary | ICD-10-CM | POA: Diagnosis present

## 2014-05-25 DIAGNOSIS — R938 Abnormal findings on diagnostic imaging of other specified body structures: Secondary | ICD-10-CM

## 2014-05-25 LAB — CBC
HCT: 32.9 % — ABNORMAL LOW (ref 36.0–46.0)
Hemoglobin: 11.3 g/dL — ABNORMAL LOW (ref 12.0–15.0)
MCH: 30.7 pg (ref 26.0–34.0)
MCHC: 34.3 g/dL (ref 30.0–36.0)
MCV: 89.4 fL (ref 78.0–100.0)
Platelets: 132 10*3/uL — ABNORMAL LOW (ref 150–400)
RBC: 3.68 MIL/uL — ABNORMAL LOW (ref 3.87–5.11)
RDW: 13.2 % (ref 11.5–15.5)
WBC: 7.8 10*3/uL (ref 4.0–10.5)

## 2014-05-25 LAB — COMPREHENSIVE METABOLIC PANEL
ALT: 49 U/L — ABNORMAL HIGH (ref 0–35)
AST: 36 U/L (ref 0–37)
Albumin: 3.5 g/dL (ref 3.5–5.2)
Alkaline Phosphatase: 102 U/L (ref 39–117)
Anion gap: 9 (ref 5–15)
BUN: 15 mg/dL (ref 6–23)
CO2: 24 mmol/L (ref 19–32)
Calcium: 8.7 mg/dL (ref 8.4–10.5)
Chloride: 107 mmol/L (ref 96–112)
Creatinine, Ser: 1.12 mg/dL — ABNORMAL HIGH (ref 0.50–1.10)
GFR calc Af Amer: 58 mL/min — ABNORMAL LOW (ref >90)
GFR calc non Af Amer: 50 mL/min — ABNORMAL LOW (ref >90)
Glucose, Bld: 118 mg/dL — ABNORMAL HIGH (ref 70–99)
Potassium: 3.9 mmol/L (ref 3.5–5.1)
Sodium: 140 mmol/L (ref 135–145)
Total Bilirubin: 0.7 mg/dL (ref 0.3–1.2)
Total Protein: 6.4 g/dL (ref 6.0–8.3)

## 2014-05-25 LAB — PHOSPHORUS: Phosphorus: 3.7 mg/dL (ref 2.3–4.6)

## 2014-05-25 LAB — TROPONIN I
Troponin I: <0.03 ng/mL (ref <0.031)
Troponin I: <0.03 ng/mL (ref <0.031)
Troponin I: <0.03 ng/mL (ref <0.031)

## 2014-05-25 LAB — TSH: TSH: 0.572 u[IU]/mL (ref 0.350–4.500)

## 2014-05-25 LAB — MAGNESIUM: Magnesium: 1.2 mg/dL — ABNORMAL LOW (ref 1.5–2.5)

## 2014-05-25 MED ORDER — TRAMADOL HCL 50 MG PO TABS: 50.0000 mg | ORAL_TABLET | Freq: Two times a day (BID) | ORAL | Status: DC | PRN

## 2014-05-25 MED ORDER — ONDANSETRON HCL 4 MG PO TABS: 4.0000 mg | ORAL_TABLET | Freq: Four times a day (QID) | ORAL | Status: DC | PRN

## 2014-05-25 MED ORDER — SODIUM CHLORIDE 0.9 % IV SOLN
INTRAVENOUS | Status: AC
  Administered 2014-05-25: 03:00:00 via INTRAVENOUS

## 2014-05-25 MED ORDER — COLCHICINE 0.6 MG PO TABS
0.6000 mg | ORAL_TABLET | Freq: Every day | ORAL | Status: DC
  Administered 2014-05-25 – 2014-05-26 (×2): 0.6 mg via ORAL
  Filled 2014-05-25 (×2): qty 1

## 2014-05-25 MED ORDER — METOCLOPRAMIDE HCL 5 MG/ML IJ SOLN
5.0000 mg | Freq: Three times a day (TID) | INTRAMUSCULAR | Status: DC
  Administered 2014-05-25 – 2014-05-26 (×4): 5 mg via INTRAVENOUS
  Filled 2014-05-25 (×7): qty 1

## 2014-05-25 MED ORDER — ONDANSETRON HCL 4 MG/2ML IJ SOLN: 4.0000 mg | Freq: Four times a day (QID) | INTRAMUSCULAR | Status: DC | PRN

## 2014-05-25 MED ORDER — ANASTROZOLE 1 MG PO TABS
1.0000 mg | ORAL_TABLET | Freq: Every day | ORAL | Status: DC
  Administered 2014-05-25 – 2014-05-26 (×2): 1 mg via ORAL
  Filled 2014-05-25 (×2): qty 1

## 2014-05-25 MED ORDER — ENOXAPARIN SODIUM 40 MG/0.4ML ~~LOC~~ SOLN
40.0000 mg | SUBCUTANEOUS | Status: DC
Start: 2014-05-25 — End: 2014-05-26
  Administered 2014-05-25 – 2014-05-26 (×2): 40 mg via SUBCUTANEOUS
  Filled 2014-05-25 (×2): qty 0.4

## 2014-05-25 MED ORDER — SODIUM CHLORIDE 0.9 % IJ SOLN
3.0000 mL | Freq: Two times a day (BID) | INTRAMUSCULAR | Status: DC
  Administered 2014-05-25 (×2): 3 mL via INTRAVENOUS

## 2014-05-25 MED ORDER — SODIUM CHLORIDE 0.9 % IV SOLN
INTRAVENOUS | Status: DC
  Administered 2014-05-25 (×2): via INTRAVENOUS
  Administered 2014-05-26: 75 mL/h via INTRAVENOUS

## 2014-05-25 MED ORDER — PREDNISONE 10 MG PO TABS
10.0000 mg | ORAL_TABLET | Freq: Every day | ORAL | Status: DC
  Administered 2014-05-25 – 2014-05-26 (×2): 10 mg via ORAL
  Filled 2014-05-25 (×2): qty 1

## 2014-05-25 MED ORDER — ACETAMINOPHEN 650 MG RE SUPP: 650.0000 mg | Freq: Four times a day (QID) | RECTAL | Status: DC | PRN

## 2014-05-25 MED ORDER — GABAPENTIN 300 MG PO CAPS
300.0000 mg | ORAL_CAPSULE | Freq: Every day | ORAL | Status: DC
  Administered 2014-05-25: 300 mg via ORAL
  Filled 2014-05-25 (×2): qty 1

## 2014-05-25 MED ORDER — SODIUM CHLORIDE 0.9 % IV BOLUS (SEPSIS)
500.0000 mL | Freq: Once | INTRAVENOUS | Status: AC
  Administered 2014-05-25: 500 mL via INTRAVENOUS

## 2014-05-25 MED ORDER — ACETAMINOPHEN 325 MG PO TABS: 650.0000 mg | ORAL_TABLET | Freq: Four times a day (QID) | ORAL | Status: DC | PRN

## 2014-05-25 MED ORDER — HYDROCODONE-ACETAMINOPHEN 5-325 MG PO TABS: 1.0000 | ORAL_TABLET | ORAL | Status: DC | PRN

## 2014-05-25 MED ORDER — IOHEXOL 300 MG/ML  SOLN
80.0000 mL | Freq: Once | INTRAMUSCULAR | Status: AC | PRN
  Administered 2014-05-25: 60 mL via INTRAVENOUS

## 2014-05-25 MED ORDER — FAMOTIDINE 20 MG PO TABS
20.0000 mg | ORAL_TABLET | Freq: Two times a day (BID) | ORAL | Status: DC
  Administered 2014-05-25 – 2014-05-26 (×3): 20 mg via ORAL
  Filled 2014-05-25 (×4): qty 1

## 2014-05-25 NOTE — ED Notes (Signed)
No changes, no emesis, calm, NAD, alert, interactive, "feel better after IVF", to CT by stretcher.

## 2014-05-25 NOTE — ED Notes (Signed)
Pt alert, NAD, calm, interactive, resps e/u, speaking in clear complete sentences, holding onto emesis bags. Recent emesis reported. No dyspnea noted. (denies: pain, dizziness, HA, sob or other sx), unable to tolerrate complete orthostatic VSs reported. EDP aware.

## 2014-05-25 NOTE — Progress Notes (Signed)
Patient Demographics  Sandy Morales, is a 64 y.o. female, DOB - [birth year], NIO:[id number]  Admit date - 05/24/2014   Admitting Physician Toy Baker, MD  Outpatient Primary MD for the patient is Aldona Bar, MD  LOS - 1   Chief Complaint  Patient presents with  . Emesis       Admission history of present illness/brief narrative: Sandy Morales is a 64 y.o. female with past medical history of Hypertension; GERD (; Breast cancer; Dyslipidemia; Osteoarthritis; and Gout. Presented with  lightheaded and weakess all over. She started to have nausea and vomiting. She had hot flushes. Reports one episode of diarrhea in AM. She have not been around anyone ill. Patient has hx of breast cancer sp masectomy with metastasis to LN on Arimidex . Denies any double vision, no vertigo, no focal neurological weakness. Plain films was obtained noted for widened mediastinum. CT chest did not show any evidence of acute intrathoracic process, patient was admitted for hydration, started on diet and advance gradually which has been tolerating very well.   Subjective:   Sandy Morales today has, No headache, No chest pain, No abdominal pain , No new tingling or numbness, No Cough - SOB.   Assessment & Plan    Active Problems:   Essential hypertension   Nausea & vomiting   Dehydration  Nausea and vomiting and diarrhea. - This is most likely related to gastroenteritis - Patient appears to be clinically dehydrated, will hold antihypertensive medication. - Daily on when necessary nausea medication  Acute renal failure - Hold antihypertensive medication, continue with gentle hydration and monitor closely  Hypokalemia -Replaced, recheck in a.m.  Hypertension - Blood pressure remains on the lower side, continue to hold medication  History of breast cancer with wide  mediastinum on chest x-ray - No acute finding on CT chest -Continue with anastrozole    Code Status: Full  Family Communication: None at bedside  Disposition Plan: Home in 24 hours   Procedures  None   Consults   None   Medications  Scheduled Meds: . anastrozole  1 mg Oral Daily  . colchicine  0.6 mg Oral Daily  . enoxaparin (LOVENOX) injection  40 mg Subcutaneous Q24H  . famotidine  20 mg Oral BID  . gabapentin  300 mg Oral QHS  . metoCLOPramide (REGLAN) injection  5 mg Intravenous 3 times per day  . predniSONE  10 mg Oral Daily  . sodium chloride  3 mL Intravenous Q12H   Continuous Infusions: . sodium chloride     PRN Meds:.acetaminophen **OR** acetaminophen, HYDROcodone-acetaminophen, ondansetron **OR** ondansetron (ZOFRAN) IV, traMADol  DVT Prophylaxis  Lovenox -   Lab Results  Component Value Date   PLT 132* 05/25/2014    Antibiotics    Anti-infectives    None          Objective:   Filed Vitals:   05/25/14 0130 05/25/14 0240 05/25/14 0436 05/25/14 1423  BP:  110/77 104/52 95/55  Pulse: 61 64 60 86  Temp:  97.7 F (36.5 C) 97.6 F (36.4 C) 97.9 F (36.6 C)  TempSrc:  Oral Oral Oral  Resp: 19 18 19 18   Height:      Weight:  83.8 kg (184 lb 11.9 oz)  SpO2: 100% 100% 100% 100%    Wt Readings from Last 3 Encounters:  05/25/14 83.8 kg (184 lb 11.9 oz)  02/12/14 88.497 kg (195 lb 1.6 oz)  08/14/13 91.627 kg (202 lb)     Intake/Output Summary (Last 24 hours) at 05/25/14 1539 Last data filed at 05/25/14 1423  Gross per 24 hour  Intake   1360 ml  Output      0 ml  Net   1360 ml     Physical Exam  Awake Alert, Oriented X 3, No new F.N deficits, Normal affect [place].AT,PERRAL, dry oral mucosa Supple Neck,No JVD, Symmetrical Chest wall movement, Good air movement bilaterally, CTAB RRR,No Gallops,Rubs or new Murmurs, No Parasternal Heave +ve B.Sounds, Abd Soft, No tenderness, No organomegaly appriciated, No rebound - guarding or  rigidity. No Cyanosis, Clubbing or edema, No new Rash or bruise     Data Review   Micro Results No results found for this or any previous visit (from the past 240 hour(s)).  Radiology Reports Ct Chest W Contrast  05/25/2014   CLINICAL DATA:  Weakness. Nausea and vomiting. Abnormal chest x-ray.  EXAM: CT CHEST WITH CONTRAST  TECHNIQUE: Multidetector CT imaging of the chest was performed during intravenous contrast administration.  CONTRAST:  29mL OMNIPAQUE IOHEXOL 300 MG/ML  SOLN  COMPARISON:  Radiographs 8 hours prior.  CT chest 12/16/2012  FINDINGS: There is 9 mm nodule in the left lobe of the thyroid gland, unchanged. The heart is at the upper limits of normal in size. The thoracic aorta is tortuous but normal in caliber. There is no dissection or aneurysm. Mild atherosclerosis. There is no mediastinal or hilar adenopathy. No pleural or pericardial effusion. Left breast implant in place. No axillary adenopathy.  The lungs are clear without consolidation. There is motion artifact at the lung bases, minimal left basilar atelectasis. No definite pulmonary nodule.  Evaluation of the upper abdomen demonstrates mild perinephric stranding about both kidneys.  No lytic or blastic osseous lesions. No acute osseous abnormality. There is degenerative change throughout spine.  IMPRESSION: 1. Mild cardiomegaly with minimal atelectasis at the left lung base. There is otherwise no acute intrathoracic process. 2. Mild perinephric stranding about the kidneys in the included upper abdomen. This can be seen in the setting of urinary tract infection versus chronic renal disease.   Electronically Signed   By: Jeb Levering M.D.   On: 05/25/2014 02:20   Dg Abd Acute W/chest  05/24/2014   CLINICAL DATA:  Vomiting and weakness.  EXAM: ACUTE ABDOMEN SERIES (ABDOMEN 2 VIEW & CHEST 1 VIEW)  COMPARISON:  Chest x-ray 12/16/2012  FINDINGS: The upright chest x-ray demonstrates stable cardiac enlargement with prominent mediastinal  and hilar contours. No infiltrates or effusions.  Two views of the abdomen demonstrate a paucity of bowel gas. There is some scattered air and stool in a colon and a few scattered air-filled loops of small bowel. No definite air-fluid levels or free air. The soft tissue shadows are grossly maintained. No worrisome calcifications. The bony structures are intact.  IMPRESSION: Stable cardiac enlargement with prominent mediastinal and hilar contours but no acute pulmonary findings other than streaky basilar atelectasis.  No plain film findings for an acute abdominal process.   Electronically Signed   By: Kalman Jewels M.D.   On: 05/24/2014 19:47    CBC  Recent Labs Lab 05/24/14 1535 05/25/14 0408  WBC 7.7 7.8  HGB 12.4 11.3*  HCT 36.7 32.9*  PLT 146* 132*  MCV 92.0  89.4  MCH 31.1 30.7  MCHC 33.8 34.3  RDW 13.1 13.2  LYMPHSABS 3.2  --   MONOABS 0.3  --   EOSABS 1.0*  --   BASOSABS 0.0  --     Chemistries   Recent Labs Lab 05/24/14 1535 05/24/14 1857 05/25/14 0408  NA 140  --  140  K 3.3*  --  3.9  CL 106  --  107  CO2 24  --  24  GLUCOSE 146*  --  118*  BUN 18  --  15  CREATININE 1.34*  --  1.12*  CALCIUM 9.1  --  8.7  MG  --   --  1.2*  AST  --  47* 36  ALT  --  54* 49*  ALKPHOS  --  106 102  BILITOT  --  1.2 0.7   ------------------------------------------------------------------------------------------------------------------ estimated creatinine clearance is 48.1 mL/min (by C-G formula based on Cr of 1.12). ------------------------------------------------------------------------------------------------------------------ No results for input(s): HGBA1C in the last 72 hours. ------------------------------------------------------------------------------------------------------------------ No results for input(s): CHOL, HDL, LDLCALC, TRIG, CHOLHDL, LDLDIRECT in the last 72  hours. ------------------------------------------------------------------------------------------------------------------  Recent Labs  05/25/14 0408  TSH 0.572   ------------------------------------------------------------------------------------------------------------------ No results for input(s): VITAMINB12, FOLATE, FERRITIN, TIBC, IRON, RETICCTPCT in the last 72 hours.  Coagulation profile No results for input(s): INR, PROTIME in the last 168 hours.  No results for input(s): DDIMER in the last 72 hours.  Cardiac Enzymes  Recent Labs Lab 05/24/14 1857 05/25/14 0847 05/25/14 1420  TROPONINI <0.03 <0.03 <0.03   ------------------------------------------------------------------------------------------------------------------ Invalid input(s): POCBNP     Time Spent in minutes   25 minutes   Odena Mcquaid M.D on 05/25/2014 at 3:39 PM  Between 7am to 7pm - Pager - [phone]  After 7pm go to [email or web address] - password [email or web address]  And look for the night coverage person covering for me after hours  [facility] Office  [phone]   **Disclaimer: This note may have been dictated with voice recognition software. Similar sounding words can inadvertently be transcribed and this note may contain transcription errors which may not have been corrected upon publication of note.**

## 2014-05-25 NOTE — ED Notes (Addendum)
Denies: pain, HA, dizziness or sob. C/o "Nausea with movement w/o dizziness and general weakness". Dr. Roel Cluck at [facility].

## 2014-05-25 NOTE — H&P (Signed)
PCP:  Aldona Bar, MD  On[facility] [place]  Chief Complaint:  Cristela Blue, lightheaded  HPI: Sandy Morales is a 64 y.o. female   has a past medical history of Hypertension; GERD (gastroesophageal reflux disease); Breast cancer; Dyslipidemia; Osteoarthritis; and Gout.   Presented with  Patient started to feel lightheaded and weak all over. She started to have nausea and vomiting. She had hot flushes. Reports one episode of diarrhea in AM. She have not been around anyone ill. Patient has hx of breast cancer sp masectomy with metastasis to LN on Arimidex . Denies any double vision, no vertigo, no focal neurological weakness.  Plain films was obtained noted for widened mediastinum.   Hospitalist was called for admission for Nausea and vomiting with dehydration.  Review of Systems:    Pertinent positives include:  Lightheaded, nausea, vomiting, diarrhea,   Constitutional:  No weight loss, night sweats, Fevers, chills, fatigue, weight loss  HEENT:  No headaches, Difficulty swallowing,Tooth/dental problems,Sore throat,  No sneezing, itching, ear ache, nasal congestion, post nasal drip,  Cardio-vascular:  No chest pain, Orthopnea, PND, anasarca, dizziness, palpitations.no Bilateral lower extremity swelling  GI:  No heartburn, indigestion, abdominal pain, change in bowel habits, loss of appetite, melena, blood in stool, hematemesis Resp:  no shortness of breath at rest. No dyspnea on exertion, No excess mucus, no productive cough, No non-productive cough, No coughing up of blood.No change in color of mucus.No wheezing. Skin:  no rash or lesions. No jaundice GU:  no dysuria, change in color of urine, no urgency or frequency. No straining to urinate.  No flank pain.  Musculoskeletal:  No joint pain or no joint swelling. No decreased range of motion. No back pain.  Psych:  No change in mood or affect. No depression or anxiety. No memory loss.  Neuro: no localizing neurological  complaints, no tingling, no weakness, no double vision, no gait abnormality, no slurred speech, no confusion  Otherwise ROS are negative except for above, 10 systems were reviewed  Past Medical History: Past Medical History  Diagnosis Date  . Hypertension   . GERD (gastroesophageal reflux disease)   . Breast cancer   . Dyslipidemia   . Osteoarthritis   . Gout    Past Surgical History  Procedure Laterality Date  . Knee surgery    . Abdominal hysterectomy    . Cyst excision      rt middle finger  . Mastectomy  04/27/2012     left breast  with tissue expander  . Mastectomy w/ sentinel node biopsy  04/27/2012    Procedure: MASTECTOMY WITH SENTINEL LYMPH NODE BIOPSY;  Surgeon: Joyice Faster. Cornett, MD;  Location: [facility];  Service: General;  Laterality: Left;  LEFT SIMPLE MASTECTOMY; SENTINEL LYMPH NODE BIOPSY  . Tissue expander placement  04/27/2012    Procedure: TISSUE EXPANDER;  Surgeon: Crissie Reese, MD;  Location: [place];  Service: Plastics;  Laterality: Left;  PLACEMENT OF LEFT BREAST TISSUE EXPANDER  . Incision and drainage of wound Left 12/16/2012    Procedure: INCISION AND DRAINAGE Hematoma IN CHEST with drain placement;  Surgeon: Crissie Reese, MD;  Location: [place] [facility];  Service: Plastics;  Laterality: Left;     Medications: Prior to Admission medications   Medication Sig Start Date End Date Taking? Authorizing Provider  anastrozole (ARIMIDEX) 1 MG tablet Take 1 tablet (1 mg total) by mouth daily. 08/14/13   Chauncey Cruel, MD  benazepril-hydrochlorthiazide (LOTENSIN HCT) 10-12.5 MG per tablet Take 1 tablet by mouth daily.  Historical Provider, MD  Calcium Carbonate (CALCIUM 600 PO) Take 600 mg by mouth daily.    Historical Provider, MD  Cholecalciferol (VITAMIN D-3) 1000 UNITS CAPS Take 1 capsule by mouth daily.    Historical Provider, MD  colchicine 0.6 MG tablet Take 0.6 mg by mouth daily.  02/06/14   Historical Provider, MD  docusate sodium 100 MG CAPS Take 100 mg by mouth daily.  12/18/12   Crissie Reese, MD  famotidine (PEPCID) 20 MG tablet Take 20 mg by mouth 2 (two) times daily.    Historical Provider, MD  gabapentin (NEURONTIN) 300 MG capsule Take 1 capsule (300 mg total) by mouth at bedtime. 02/12/14   Marcelino Duster, NP  meloxicam (MOBIC) 7.5 MG tablet Take by mouth 2 (two) times daily.  08/11/13   Historical Provider, MD  ondansetron (ZOFRAN) 4 MG tablet Take 1 tablet (4 mg total) by mouth every 6 (six) hours. 05/24/14   Orpah Greek, MD  pravastatin (PRAVACHOL) 80 MG tablet Take 80 mg by mouth daily.    Historical Provider, MD  predniSONE (DELTASONE) 10 MG tablet Take 2 tablets (20 mg total) by mouth daily. 05/01/14   Maudry Diego, MD  traMADol (ULTRAM) 50 MG tablet Take 50 mg by mouth 2 (two) times daily as needed.  02/08/13   Historical Provider, MD  vitamin E 100 UNIT capsule Take 100 Units by mouth daily.    Historical Provider, MD    Allergies:  No Known Allergies  Social History:  Ambulatory  Independently or cane  Lives at home alone,       reports that she has never smoked. She has never used smokeless tobacco. She reports that she uses illicit drugs (Marijuana). She reports that she does not drink alcohol.    Family History: family history includes Cancer in her mother; Lung cancer in her father.    Physical Exam: Patient Vitals for the past 24 hrs:  BP Temp Pulse Resp SpO2 Height Weight  05/25/14 0000 112/64 mmHg - 67 20 100 % - -  05/24/14 2345 111/61 mmHg - (!) 59 17 97 % - -  05/24/14 2330 (!) 106/53 mmHg - (!) 59 18 99 % - -  05/24/14 2315 95/68 mmHg - 62 16 98 % - -  05/24/14 2300 97/58 mmHg - 67 17 99 % - -  05/24/14 2245 98/64 mmHg - (!) 59 20 99 % - -  05/24/14 2230 101/68 mmHg - (!) 51 20 100 % - -  05/24/14 2215 100/75 mmHg - 69 24 100 % - -  05/24/14 2130 112/63 mmHg - 66 17 94 % - -  05/24/14 2115 (!) 104/53 mmHg - 64 20 97 % - -  05/24/14 2100 105/62 mmHg - 69 20 96 % - -  05/24/14 2045 102/66 mmHg - 67 18 91 % -  -  05/24/14 2030 108/60 mmHg - 69 21 92 % - -  05/24/14 2000 106/87 mmHg - 63 19 97 % - -  05/24/14 1900 107/62 mmHg - 61 18 99 % - -  05/24/14 1845 (!) 118/50 mmHg - (!) 58 21 100 % - -  05/24/14 1529 141/88 mmHg 97.6 F (36.4 C) 81 20 96 % 5' (1.524 m) 84.369 kg (186 lb)    1. General:  in No Acute distress 2. Psychological: Alert and   Oriented 3. Head/ENT:     Dry Mucous Membranes  Head Non traumatic, neck supple                          Normal  Dentition 4. SKIN:   decreased Skin turgor,  Skin clean Dry and intact no rash 5. Heart: Regular rate and rhythm no Murmur, Rub or gallop 6. Lungs: no wheezes or crackles  Diminished at the bases left worse than right 7. Abdomen: Soft, non-tender, Non distended 8. Lower extremities: no clubbing, cyanosis, or edema 9. Neurologically strength 5/5 CN 2-12 intact, no nystagmus 10. MSK: Normal range of motion  body mass index is 36.33 kg/(m^2).   Labs on Admission:   Results for orders placed or performed during the hospital encounter of 05/24/14 (from the past 24 hour(s))  CBC with Differential     Status: Abnormal   Collection Time: 05/24/14  3:35 PM  Result Value Ref Range   WBC 7.7 4.0 - 10.5 K/uL   RBC 3.99 3.87 - 5.11 MIL/uL   Hemoglobin 12.4 12.0 - 15.0 g/dL   HCT 36.7 36.0 - 46.0 %   MCV 92.0 78.0 - 100.0 fL   MCH 31.1 26.0 - 34.0 pg   MCHC 33.8 30.0 - 36.0 g/dL   RDW 13.1 11.5 - 15.5 %   Platelets 146 (L) 150 - 400 K/uL   Neutrophils Relative % 41 (L) 43 - 77 %   Neutro Abs 3.2 1.7 - 7.7 K/uL   Lymphocytes Relative 42 12 - 46 %   Lymphs Abs 3.2 0.7 - 4.0 K/uL   Monocytes Relative 4 3 - 12 %   Monocytes Absolute 0.3 0.1 - 1.0 K/uL   Eosinophils Relative 13 (H) 0 - 5 %   Eosinophils Absolute 1.0 (H) 0.0 - 0.7 K/uL   Basophils Relative 0 0 - 1 %   Basophils Absolute 0.0 0.0 - 0.1 K/uL  Basic metabolic panel     Status: Abnormal   Collection Time: 05/24/14  3:35 PM  Result Value Ref Range    Sodium 140 135 - 145 mmol/L   Potassium 3.3 (L) 3.5 - 5.1 mmol/L   Chloride 106 96 - 112 mmol/L   CO2 24 19 - 32 mmol/L   Glucose, Bld 146 (H) 70 - 99 mg/dL   BUN 18 6 - 23 mg/dL   Creatinine, Ser 1.34 (H) 0.50 - 1.10 mg/dL   Calcium 9.1 8.4 - 10.5 mg/dL   GFR calc non Af Amer 41 (L) >90 mL/min   GFR calc Af Amer 47 (L) >90 mL/min   Anion gap 10 5 - 15  Hepatic function panel     Status: Abnormal   Collection Time: 05/24/14  6:57 PM  Result Value Ref Range   Total Protein 6.6 6.0 - 8.3 g/dL   Albumin 3.8 3.5 - 5.2 g/dL   AST 47 (H) 0 - 37 U/L   ALT 54 (H) 0 - 35 U/L   Alkaline Phosphatase 106 39 - 117 U/L   Total Bilirubin 1.2 0.3 - 1.2 mg/dL   Bilirubin, Direct 0.3 0.0 - 0.5 mg/dL   Indirect Bilirubin 0.9 0.3 - 0.9 mg/dL  Troponin I     Status: None   Collection Time: 05/24/14  6:57 PM  Result Value Ref Range   Troponin I <0.03 <0.031 ng/mL  Lipase, blood     Status: None   Collection Time: 05/24/14  6:57 PM  Result Value Ref Range   Lipase 24 11 - 59 U/L  I-Stat CG4 Lactic Acid, ED     Status: Abnormal   Collection Time: 05/24/14  7:03 PM  Result Value Ref Range   Lactic Acid, Venous 3.62 (HH) 0.5 - 2.0 mmol/L   Comment NOTIFIED PHYSICIAN   Urinalysis, Routine w reflex microscopic     Status: Abnormal   Collection Time: 05/24/14  9:48 PM  Result Value Ref Range   Color, Urine YELLOW YELLOW   APPearance CLEAR CLEAR   Specific Gravity, Urine 1.018 1.005 - 1.030   pH 5.0 5.0 - 8.0   Glucose, UA NEGATIVE NEGATIVE mg/dL   Hgb urine dipstick NEGATIVE NEGATIVE   Bilirubin Urine NEGATIVE NEGATIVE   Ketones, ur NEGATIVE NEGATIVE mg/dL   Protein, ur NEGATIVE NEGATIVE mg/dL   Urobilinogen, UA 0.2 0.0 - 1.0 mg/dL   Nitrite NEGATIVE NEGATIVE   Leukocytes, UA SMALL (A) NEGATIVE  Urine microscopic-add on     Status: Abnormal   Collection Time: 05/24/14  9:48 PM  Result Value Ref Range   Squamous Epithelial / LPF FEW (A) RARE   WBC, UA 0-2 <3 WBC/hpf   Bacteria, UA FEW (A)  RARE   Casts GRANULAR CAST (A) NEGATIVE    UA few bacteria  Lab Results  Component Value Date   HGBA1C 5.5 12/16/2012    Estimated Creatinine Clearance: 40.4 mL/min (by C-G formula based on Cr of 1.34).  BNP (last 3 results) No results for input(s): PROBNP in the last 8760 hours.  Other results:  I have pearsonaly reviewed this: ECG REPORT  Rate: 61  Rhythm: SR with PAC ST&T Change: no ischemia   Filed Weights   05/24/14 1529  Weight: 84.369 kg (186 lb)     Cultures:    Component Value Date/Time   SDES BLOOD RIGHT ARM 12/16/2012 0655   SPECREQUEST BOTTLES DRAWN AEROBIC ONLY [place] 12/16/2012 0655   CULT  12/16/2012 0655    NO GROWTH 5 DAYS Performed at [facility] 12/22/2012 FINAL 12/16/2012 0655     Radiological Exams on Admission: Dg Abd Acute W/chest  05/24/2014   CLINICAL DATA:  Vomiting and weakness.  EXAM: ACUTE ABDOMEN SERIES (ABDOMEN 2 VIEW & CHEST 1 VIEW)  COMPARISON:  Chest x-ray 12/16/2012  FINDINGS: The upright chest x-ray demonstrates stable cardiac enlargement with prominent mediastinal and hilar contours. No infiltrates or effusions.  Two views of the abdomen demonstrate a paucity of bowel gas. There is some scattered air and stool in a colon and a few scattered air-filled loops of small bowel. No definite air-fluid levels or free air. The soft tissue shadows are grossly maintained. No worrisome calcifications. The bony structures are intact.  IMPRESSION: Stable cardiac enlargement with prominent mediastinal and hilar contours but no acute pulmonary findings other than streaky basilar atelectasis.  No plain film findings for an acute abdominal process.   Electronically Signed   By: Kalman Jewels M.D.   On: 05/24/2014 19:47    Chart has been reviewed  Assessment/Plan  64 yo F with hx of breast cancer here with lightheadedness, nausea and vomiting evidence of dehydration and widened mediastinum  Present on Admission:  . Nausea &  vomiting - etiology unclear unlikely to be central given benign neurological exam and episode of diarrhea, supportive management . Essential hypertension - hold HCTZ , hold BP meds given slightly soft BP  . Dehydration -IVF Hypokalemia -will replace Widened mediastinum - will obtain CT to evaluate further given ongoing nausea and vomiting would help to evaluate for any  esophageal abnormality. Patient also has hx of breast CA.    Prophylaxis:   Lovenox, Protonix  CODE STATUS:  FULL CODE   Other plan as per orders.  I have spent a total of 55 min on this admission  Aamani Moose 05/25/2014, 12:55 AM  [facility]  Pager [phone]   after 2 AM please page floor coverage PA If 7AM-7PM, please contact the day team taking care of the patient  [email or web address]  Password [email or web address]

## 2014-05-26 DIAGNOSIS — R112 Nausea with vomiting, unspecified: Secondary | ICD-10-CM

## 2014-05-26 LAB — BASIC METABOLIC PANEL
Anion gap: 5 (ref 5–15)
BUN: 12 mg/dL (ref 6–23)
CO2: 27 mmol/L (ref 19–32)
Calcium: 8.6 mg/dL (ref 8.4–10.5)
Chloride: 112 mmol/L (ref 96–112)
Creatinine, Ser: 1.1 mg/dL (ref 0.50–1.10)
GFR calc Af Amer: 60 mL/min — ABNORMAL LOW (ref >90)
GFR calc non Af Amer: 52 mL/min — ABNORMAL LOW (ref >90)
Glucose, Bld: 90 mg/dL (ref 70–99)
Potassium: 3.3 mmol/L — ABNORMAL LOW (ref 3.5–5.1)
Sodium: 144 mmol/L (ref 135–145)

## 2014-05-26 MED ORDER — POTASSIUM CHLORIDE CRYS ER 20 MEQ PO TBCR
40.0000 meq | EXTENDED_RELEASE_TABLET | Freq: Once | ORAL | Status: AC
  Administered 2014-05-26: 40 meq via ORAL
  Filled 2014-05-26: qty 2

## 2014-05-26 MED ORDER — POTASSIUM CHLORIDE ER 10 MEQ PO TBCR: 20.0000 meq | EXTENDED_RELEASE_TABLET | Freq: Every day | ORAL | Status: AC

## 2014-05-26 NOTE — Discharge Summary (Signed)
Sandy Morales, 64 y.o., DOB 1951-03-19, MRN [id number]. Admission date: 05/24/2014 Discharge Date 05/26/2014 Primary MD Aldona Bar, MD Admitting Physician Toy Baker, MD   PCP please follow on: - Check CBC, BMP during next visit, as patient was with mild elevation of her creatinine, is 1.1 at day of discharge. - Please consider resuming care antihypertensive medication(lisinopril /hydrochlorothiazide) during next visit if blood pressure is acceptable, as her blood pressure was on the soft side during hospitalization.  Admission Diagnosis  Vomiting [R11.10] Abnormal CXR [R93.8]  Discharge Diagnosis   Active Problems:   Essential hypertension   Nausea & vomiting   Dehydration      Past Medical History  Diagnosis Date  . Hypertension   . GERD (gastroesophageal reflux disease)   . Breast cancer   . Dyslipidemia   . Osteoarthritis   . Gout     Past Surgical History  Procedure Laterality Date  . Knee surgery    . Abdominal hysterectomy    . Cyst excision      rt middle finger  . Mastectomy  04/27/2012     left breast  with tissue expander  . Mastectomy w/ sentinel node biopsy  04/27/2012    Procedure: MASTECTOMY WITH SENTINEL LYMPH NODE BIOPSY;  Surgeon: Joyice Faster. Cornett, MD;  Location: [facility];  Service: General;  Laterality: Left;  LEFT SIMPLE MASTECTOMY; SENTINEL LYMPH NODE BIOPSY  . Tissue expander placement  04/27/2012    Procedure: TISSUE EXPANDER;  Surgeon: Crissie Reese, MD;  Location: [facility];  Service: Plastics;  Laterality: Left;  PLACEMENT OF LEFT BREAST TISSUE EXPANDER  . Incision and drainage of wound Left 12/16/2012    Procedure: INCISION AND DRAINAGE Hematoma IN CHEST with drain placement;  Surgeon: Crissie Reese, MD;  Location: [facility];  Service: Plastics;  Laterality: Left;     Hospital Course See H&P, Labs, Consult and Test reports for all details in brief, patient was admitted for **  Active Problems:   Essential hypertension   Nausea & vomiting    Dehydration  Sandy Morales is a 64 y.o. female with past medical history of Hypertension; GERD (; Breast cancer; Dyslipidemia; Osteoarthritis; and Gout. Presented with lightheaded and weakess all over. She started to have nausea and vomiting. She had hot flushes. Reports one episode of diarrhea in AM. She have not been around anyone ill. Patient has hx of breast cancer sp masectomy with metastasis to LN on Arimidex . Denies any double vision, no vertigo, no focal neurological weakness. Plain films was obtained noted for widened mediastinum. CT chest did not show any evidence of acute intrathoracic process, patient was admitted for hydration, started on diet and advance gradually which has been tolerating very well. Nausea and vomiting and diarrhea. - This is most likely related to gastroenteritis - Patient appears to be clinically dehydrated on admission. - Resolved, tolerating regular diet, no further nausea or vomiting.  Acute renal failure - Patient was on gentle hydration during hospital stay, creatinine is 1.1 at day of discharge  Hypokalemia -Replaced, potassium on day of discharge is 3.3, so will discharge with potassium supplement for total of 3 days.  Hypertension - Blood pressure remains on the lower side, continue to hold medication on discharge, to be readdressed by her PCP during tomorrow's visit.  History of breast cancer with wide mediastinum on chest x-ray - No acute finding on CT chest -Continue with anastrozole  Consults  None  Significant Tests:  See full reports for all details  Ct Chest W Contrast  05/25/2014   CLINICAL DATA:  Weakness. Nausea and vomiting. Abnormal chest x-ray.  EXAM: CT CHEST WITH CONTRAST  TECHNIQUE: Multidetector CT imaging of the chest was performed during intravenous contrast administration.  CONTRAST:  6mL OMNIPAQUE IOHEXOL 300 MG/ML  SOLN  COMPARISON:  Radiographs 8 hours prior.  CT chest 12/16/2012  FINDINGS: There is 9 mm nodule in  the left lobe of the thyroid gland, unchanged. The heart is at the upper limits of normal in size. The thoracic aorta is tortuous but normal in caliber. There is no dissection or aneurysm. Mild atherosclerosis. There is no mediastinal or hilar adenopathy. No pleural or pericardial effusion. Left breast implant in place. No axillary adenopathy.  The lungs are clear without consolidation. There is motion artifact at the lung bases, minimal left basilar atelectasis. No definite pulmonary nodule.  Evaluation of the upper abdomen demonstrates mild perinephric stranding about both kidneys.  No lytic or blastic osseous lesions. No acute osseous abnormality. There is degenerative change throughout spine.  IMPRESSION: 1. Mild cardiomegaly with minimal atelectasis at the left lung base. There is otherwise no acute intrathoracic process. 2. Mild perinephric stranding about the kidneys in the included upper abdomen. This can be seen in the setting of urinary tract infection versus chronic renal disease.   Electronically Signed   By: Jeb Levering M.D.   On: 05/25/2014 02:20   Dg Abd Acute W/chest  05/24/2014   CLINICAL DATA:  Vomiting and weakness.  EXAM: ACUTE ABDOMEN SERIES (ABDOMEN 2 VIEW & CHEST 1 VIEW)  COMPARISON:  Chest x-ray 12/16/2012  FINDINGS: The upright chest x-ray demonstrates stable cardiac enlargement with prominent mediastinal and hilar contours. No infiltrates or effusions.  Two views of the abdomen demonstrate a paucity of bowel gas. There is some scattered air and stool in a colon and a few scattered air-filled loops of small bowel. No definite air-fluid levels or free air. The soft tissue shadows are grossly maintained. No worrisome calcifications. The bony structures are intact.  IMPRESSION: Stable cardiac enlargement with prominent mediastinal and hilar contours but no acute pulmonary findings other than streaky basilar atelectasis.  No plain film findings for an acute abdominal process.    Electronically Signed   By: Kalman Jewels M.D.   On: 05/24/2014 19:47     Today   Subjective:   Madison Direnzo today has no headache,no chest abdominal pain,no new weakness tingling or numbness, feels much better wants to go home today.   Objective:   Blood pressure 126/69, pulse 71, temperature 98.2 F (36.8 C), temperature source Oral, resp. rate 20, height 5' (1.524 m), weight 83.8 kg (184 lb 11.9 oz), SpO2 100 %.  Intake/Output Summary (Last 24 hours) at 05/26/14 1047 Last data filed at 05/25/14 1815  Gross per 24 hour  Intake    480 ml  Output      0 ml  Net    480 ml    Exam Awake Alert, Oriented *3, No new F.N deficits, Normal affect [place].AT,PERRAL Supple Neck,No JVD, No cervical lymphadenopathy appriciated.  Symmetrical Chest wall movement, Good air movement bilaterally, CTAB RRR,No Gallops,Rubs or new Murmurs, No Parasternal Heave +ve B.Sounds, Abd Soft, Non tender, No organomegaly appriciated, No rebound -guarding or rigidity. No Cyanosis, Clubbing or edema, No new Rash or bruise  Data Review   Cultures -   CBC w Diff:  Lab Results  Component Value Date   WBC 7.8 05/25/2014   WBC 4.2 02/05/2014  HGB 11.3* 05/25/2014   HGB 11.9 02/05/2014   HCT 32.9* 05/25/2014   HCT 37.6 02/05/2014   PLT 132* 05/25/2014   PLT 209 02/05/2014   LYMPHOPCT 42 05/24/2014   LYMPHOPCT 34.5 02/05/2014   MONOPCT 4 05/24/2014   MONOPCT 11.0 02/05/2014   EOSPCT 13* 05/24/2014   EOSPCT 5.4 02/05/2014   BASOPCT 0 05/24/2014   BASOPCT 0.7 02/05/2014   CMP:  Lab Results  Component Value Date   NA 144 05/26/2014   NA 142 02/05/2014   K 3.3* 05/26/2014   K 4.3 02/05/2014   CL 112 05/26/2014   CL 101 08/16/2012   CO2 27 05/26/2014   CO2 24 02/05/2014   BUN 12 05/26/2014   BUN 14.5 02/05/2014   CREATININE 1.10 05/26/2014   CREATININE 1.3* 02/05/2014   PROT 6.4 05/25/2014   PROT 7.2 02/05/2014   ALBUMIN 3.5 05/25/2014   ALBUMIN 3.9 02/05/2014   BILITOT 0.7 05/25/2014    BILITOT 0.78 02/05/2014   ALKPHOS 102 05/25/2014   ALKPHOS 119 02/05/2014   AST 36 05/25/2014   AST 25 02/05/2014   ALT 49* 05/25/2014   ALT 30 02/05/2014  .  Micro Results No results found for this or any previous visit (from the past 240 hour(s)).   Discharge Instructions          Follow-up Information    Follow up with Aldona Bar, MD. Call in 1 week.   Specialty:  Internal Medicine   Why:  posthospitalization follow up,keep your appointment tomorrow.      Discharge Medications     Medication List    STOP taking these medications        benazepril-hydrochlorthiazide 10-12.5 MG per tablet  Commonly known as:  LOTENSIN HCT     meloxicam 7.5 MG tablet  Commonly known as:  MOBIC      TAKE these medications        anastrozole 1 MG tablet  Commonly known as:  ARIMIDEX  Take 1 tablet (1 mg total) by mouth daily.     CALCIUM 600 PO  Take 600 mg by mouth daily.     colchicine 0.6 MG tablet  Take 0.6 mg by mouth daily.     diphenhydrAMINE 25 mg capsule  Commonly known as:  BENADRYL  Take 25 mg by mouth every 6 (six) hours as needed for itching.     DSS 100 MG Caps  Take 100 mg by mouth daily.     famotidine 20 MG tablet  Commonly known as:  PEPCID  Take 20 mg by mouth 2 (two) times daily.     ondansetron 4 MG tablet  Commonly known as:  ZOFRAN  Take 1 tablet (4 mg total) by mouth every 6 (six) hours.     potassium chloride 10 MEQ tablet  Commonly known as:  K-DUR  Take 2 tablets (20 mEq total) by mouth daily.  Start taking on:  05/27/2014     pravastatin 80 MG tablet  Commonly known as:  PRAVACHOL  Take 80 mg by mouth daily.     traMADol 50 MG tablet  Commonly known as:  ULTRAM  Take 50 mg by mouth 2 (two) times daily as needed.     Vitamin D-3 1000 UNITS Caps  Take 1 capsule by mouth daily.     vitamin E 100 UNIT capsule  Take 100 Units by mouth daily.         Total Time in preparing paper work, data evaluation and  todays exam -  35 minutes  Jesiah Grismer M.D on 05/26/2014 at 10:47 AM  [facility] Office  [phone]

## 2014-05-27 LAB — HEMOGLOBIN A1C
Hgb A1c MFr Bld: 5.6 % (ref 4.8–5.6)
Mean Plasma Glucose: 114 mg/dL

## 2014-08-28 ENCOUNTER — Other Ambulatory Visit: Payer: Self-pay | Admitting: Oncology

## 2014-09-02 ENCOUNTER — Telehealth: Payer: Self-pay | Admitting: *Deleted

## 2014-09-02 NOTE — Telephone Encounter (Signed)
Called pt to let her know I would be mailing her Rx for wig today to her home and will call her in a few days to make sure she has received it.

## 2015-02-10 ENCOUNTER — Other Ambulatory Visit (HOSPITAL_BASED_OUTPATIENT_CLINIC_OR_DEPARTMENT_OTHER): Payer: Commercial Managed Care - HMO

## 2015-02-10 DIAGNOSIS — C773 Secondary and unspecified malignant neoplasm of axilla and upper limb lymph nodes: Secondary | ICD-10-CM | POA: Diagnosis not present

## 2015-02-10 DIAGNOSIS — C50912 Malignant neoplasm of unspecified site of left female breast: Secondary | ICD-10-CM

## 2015-02-10 LAB — COMPREHENSIVE METABOLIC PANEL (CC13)
ALT: 16 U/L (ref 0–55)
AST: 16 U/L (ref 5–34)
Albumin: 3.9 g/dL (ref 3.5–5.0)
Alkaline Phosphatase: 102 U/L (ref 40–150)
Anion Gap: 9 mEq/L (ref 3–11)
BUN: 26.2 mg/dL — ABNORMAL HIGH (ref 7.0–26.0)
CO2: 26 mEq/L (ref 22–29)
Calcium: 10 mg/dL (ref 8.4–10.4)
Chloride: 107 mEq/L (ref 98–109)
Creatinine: 1.3 mg/dL — ABNORMAL HIGH (ref 0.6–1.1)
EGFR: 48 mL/min/{1.73_m2} — ABNORMAL LOW (ref >90)
Glucose: 127 mg/dl (ref 70–140)
Potassium: 4.3 mEq/L (ref 3.5–5.1)
Sodium: 142 mEq/L (ref 136–145)
Total Bilirubin: 0.74 mg/dL (ref 0.20–1.20)
Total Protein: 7.4 g/dL (ref 6.4–8.3)

## 2015-02-10 LAB — CBC WITH DIFFERENTIAL/PLATELET
BASO%: 0.5 % (ref 0.0–2.0)
Basophils Absolute: 0 10*3/uL (ref 0.0–0.1)
EOS%: 3.6 % (ref 0.0–7.0)
Eosinophils Absolute: 0.2 10*3/uL (ref 0.0–0.5)
HCT: 37 % (ref 34.8–46.6)
HGB: 12.2 g/dL (ref 11.6–15.9)
LYMPH%: 37.8 % (ref 14.0–49.7)
MCH: 31.6 pg (ref 25.1–34.0)
MCHC: 33 g/dL (ref 31.5–36.0)
MCV: 95.7 fL (ref 79.5–101.0)
MONO#: 0.7 10*3/uL (ref 0.1–0.9)
MONO%: 11.2 % (ref 0.0–14.0)
NEUT#: 2.7 10*3/uL (ref 1.5–6.5)
NEUT%: 46.9 % (ref 38.4–76.8)
Platelets: 219 10*3/uL (ref 145–400)
RBC: 3.87 10*6/uL (ref 3.70–5.45)
RDW: 13 % (ref 11.2–14.5)
WBC: 5.8 10*3/uL (ref 3.9–10.3)
lymph#: 2.2 10*3/uL (ref 0.9–3.3)

## 2015-02-17 ENCOUNTER — Ambulatory Visit (HOSPITAL_BASED_OUTPATIENT_CLINIC_OR_DEPARTMENT_OTHER): Payer: Commercial Managed Care - HMO | Admitting: Oncology

## 2015-02-17 ENCOUNTER — Telehealth: Payer: Self-pay | Admitting: Oncology

## 2015-02-17 VITALS — BP 135/86 | HR 85 | Temp 98.0°F | Resp 18 | Ht 60.0 in | Wt 190.4 lb

## 2015-02-17 DIAGNOSIS — M8580 Other specified disorders of bone density and structure, unspecified site: Secondary | ICD-10-CM

## 2015-02-17 DIAGNOSIS — Z79811 Long term (current) use of aromatase inhibitors: Secondary | ICD-10-CM

## 2015-02-17 DIAGNOSIS — C50419 Malignant neoplasm of upper-outer quadrant of unspecified female breast: Secondary | ICD-10-CM

## 2015-02-17 NOTE — Telephone Encounter (Signed)
Appointments made and avs printed for patient,mammo and dexa faxed to the breast center due to hold time and due to patients insurance she has to get a ref. From her primary care phy  anne

## 2015-02-17 NOTE — Progress Notes (Signed)
ID: Janan Ridge   DOB: 09/20/1950  MR#: [id number]  KBT#:[id number]  PCP: Antonietta Jewel MD GYN:  SU: Erroll Luna OTHER MD: Dr. Aldona Bar, Dr Thea Silversmith, Crissie Reese  CHIEF COMPLAINT: left breast cancer  CURRENT TREATMENT: anastrozole  BREAST CANCER HISTORY:  from the original intake note:  Ms. Aristizabal had routine screening mammography at [facility] hospital 01/04/2012 showing heterogeneously dense breasts. In addition, in the left breast, new calcifications were noted. Left diagnostic mammography at the breast center 01/11/2012 confirmed amorphous calcifications in the left upper outer quadrant. Biopsy was performed 01/18/2012, and showed ([id number]) ductal carcinoma in situ, grade 3, strongly estrogen and progesterone receptor positive. The patient's subsequent history is as detailed below.  INTERVAL HISTORY: Jiana returns today for follow up of her breast cancer.  Interval history is unremarkable. She continues on anastrozole, which she obtains for approximately $3 a month. She tolerates it well, with some hot flashes and night sweats, but no significant arthralgias or myalgias. Vaginal dryness is not an issue.  REVIEW OF SYSTEMS:   She has chronic back pain, which is not more intense or persistent than before. Just a little bit or ringing in her years. This is due to seasonal allergies, she tells me. It happens every year. A detailed review of systems today was otherwise noncontributory  PAST MEDICAL HISTORY: Past Medical History  Diagnosis Date  . Hypertension   . GERD (gastroesophageal reflux disease)   . Breast cancer   . Dyslipidemia   . Osteoarthritis   . Gout     PAST SURGICAL HISTORY: Past Surgical History  Procedure Laterality Date  . Knee surgery    . Abdominal hysterectomy    . Cyst excision      rt middle finger  . Mastectomy  04/27/2012     left breast  with tissue expander  . Mastectomy w/ sentinel node biopsy  04/27/2012    Procedure: MASTECTOMY  WITH SENTINEL LYMPH NODE BIOPSY;  Surgeon: Joyice Faster. Cornett, MD;  Location: [facility];  Service: General;  Laterality: Left;  LEFT SIMPLE MASTECTOMY; SENTINEL LYMPH NODE BIOPSY  . Tissue expander placement  04/27/2012    Procedure: TISSUE EXPANDER;  Surgeon: Crissie Reese, MD;  Location: [facility];  Service: Plastics;  Laterality: Left;  PLACEMENT OF LEFT BREAST TISSUE EXPANDER  . Incision and drainage of wound Left 12/16/2012    Procedure: INCISION AND DRAINAGE Hematoma IN CHEST with drain placement;  Surgeon: Crissie Reese, MD;  Location: [facility];  Service: Plastics;  Laterality: Left;    FAMILY HISTORY Family History  Problem Relation Age of Onset  . Cancer Mother     Unknown type of cancer  . Lung cancer Father    the patient's father died from lung cancer in the setting of tobacco abuse. The patient's mother died possibly from cancer as well. The patient is not quite sure of the age of death of either of her parents. Ms. Straka had 3 brothers and 7 sisters. She is not aware of any breast or ovarian cancer history in the family.  GYNECOLOGIC HISTORY: First menstrual period age 36. First live birth age 57. She is GX P3. She underwent TAH-BSO in 1995. She never used hormone replacement.  SOCIAL HISTORY: (updated OCT 2013) [employer] worked for a [employer] for 32 years. 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 She has been widowed for approximately 11 years. She lives by herself. Her children are out Joanne Gavel, who  is currently incarcerated; Shayma Pfefferle, who lives in [place] and works in Architect; and Jaxon Mynhier, who is disabled secondary to neuro fibromatosis. The patient has 16 grandchildren and 2 great-grandchildren. She attends a [employer], medical church.   ADVANCED DIRECTIVES: Not in place; the patient tells me she has talked with their sons Timthony and Coralyn Mark and that they would make decisions together if she became mantle incapacitated.  HEALTH  MAINTENANCE: Social History  Substance Use Topics  . Smoking status: Never Smoker   . Smokeless tobacco: Never Used     Comment: Uses marijuana    . Alcohol Use: No     Comment: occasonal     Colonoscopy: 2011  PAP: s/p hyst  Bone density: never  Lipid panel:  No Known Allergies  Current Outpatient Prescriptions  Medication Sig Dispense Refill  . anastrozole (ARIMIDEX) 1 MG tablet TAKE ONE TABLET BY MOUTH ONCE DAILY 90 tablet 2  . Calcium Carbonate (CALCIUM 600 PO) Take 600 mg by mouth daily.    . Cholecalciferol (VITAMIN D-3) 1000 UNITS CAPS Take 1 capsule by mouth daily.    . colchicine 0.6 MG tablet Take 0.6 mg by mouth daily.     . diphenhydrAMINE (BENADRYL) 25 mg capsule Take 25 mg by mouth every 6 (six) hours as needed for itching.    . docusate sodium 100 MG CAPS Take 100 mg by mouth daily. (Patient taking differently: Take 100 mg by mouth daily as needed (constipation). ) 10 capsule 0  . famotidine (PEPCID) 20 MG tablet Take 20 mg by mouth 2 (two) times daily.    . ondansetron (ZOFRAN) 4 MG tablet Take 1 tablet (4 mg total) by mouth every 6 (six) hours. 30 tablet 0  . potassium chloride (K-DUR) 10 MEQ tablet Take 2 tablets (20 mEq total) by mouth daily. 6 tablet 0  . pravastatin (PRAVACHOL) 80 MG tablet Take 80 mg by mouth daily.    . traMADol (ULTRAM) 50 MG tablet Take 50 mg by mouth 2 (two) times daily as needed.     . vitamin E 100 UNIT capsule Take 100 Units by mouth daily.     No current facility-administered medications for this visit.    OBJECTIVE: Middle-aged [place] American woman in no acute distress  Filed Vitals:   02/17/15 1111  BP: 135/86  Pulse: 85  Temp: 98 F (36.7 C)  Resp: 18     Body mass index is 37.19 kg/(m^2).    ECOG FS: 0  Sclerae unicteric, pupils round and equal Oropharynx clear and moist-- no thrush or other lesions No cervical or supraclavicular adenopathy Lungs no rales or rhonchi Heart regular rate and rhythm Abd soft,  nontender, positive bowel sounds MSK no focal spinal tenderness, no upper extremity lymphedema Neuro: nonfocal, well oriented, appropriate affect Breasts:  The right breast is unremarkable. The left breast is status post mastectomy and reconstruction. There is no evidence of local recurrence. The left axilla is benign  skin: There are some  Pigment changes  In the face in a rosacea distribution suggestive of perhaps early vitiligo, but I do not see any vitiligo lesions anywhere else in the upper trunk    LAB RESULTS: Lab Results  Component Value Date   WBC 5.8 02/10/2015   NEUTROABS 2.7 02/10/2015   HGB 12.2 02/10/2015   HCT 37.0 02/10/2015   MCV 95.7 02/10/2015   PLT 219 02/10/2015      Chemistry      Component Value Date/Time   NA  142 02/10/2015 1109   NA 144 05/26/2014 0410   K 4.3 02/10/2015 1109   K 3.3* 05/26/2014 0410   CL 112 05/26/2014 0410   CL 101 08/16/2012 1055   CO2 26 02/10/2015 1109   CO2 27 05/26/2014 0410   BUN 26.2* 02/10/2015 1109   BUN 12 05/26/2014 0410   CREATININE 1.3* 02/10/2015 1109   CREATININE 1.10 05/26/2014 0410      Component Value Date/Time   CALCIUM 10.0 02/10/2015 1109   CALCIUM 8.6 05/26/2014 0410   ALKPHOS 102 02/10/2015 1109   ALKPHOS 102 05/25/2014 0408   AST 16 02/10/2015 1109   AST 36 05/25/2014 0408   ALT 16 02/10/2015 1109   ALT 49* 05/25/2014 0408   BILITOT 0.74 02/10/2015 1109   BILITOT 0.7 05/25/2014 0408       No results found for: LABCA2  No components found for: LABCA125  No results for input(s): INR in the last 168 hours.  Urinalysis    Component Value Date/Time   COLORURINE YELLOW 05/24/2014 2148   APPEARANCEUR CLEAR 05/24/2014 2148   LABSPEC 1.018 05/24/2014 2148   PHURINE 5.0 05/24/2014 2148   GLUCOSEU NEGATIVE 05/24/2014 2148   HGBUR NEGATIVE 05/24/2014 2148   HGBUR negative 05/26/2010 1051   [place] NEGATIVE 05/24/2014 2148   KETONESUR NEGATIVE 05/24/2014 2148   PROTEINUR NEGATIVE 05/24/2014  2148   UROBILINOGEN 0.2 05/24/2014 2148   NITRITE NEGATIVE 05/24/2014 2148   LEUKOCYTESUR SMALL* 05/24/2014 2148    STUDIES:  mammography and bone density both do this year ASSESSMENT: 64 y.o. [place] woman status post left mastectomy and sentinel lymph node sampling 04/27/2012 for a pTis pN1, stage IIA invasive ductal carcinoma, grade 2, estrogen and progesterone receptors both 100% positive, with an MIB-1 of 36%, and no HER-2 amplification.  (1) anastrozole started may 2014  (2) osteopenia, with bone density October 2014 showing a T score of -1.1  PLAN:  Charlestine  Is doing fine as far as her noninvasive breast cancer is concerned and the plan is to continue anastrozole for a total of 5 years.   She is due for mammography and bone density and I am requesting both those tests to be done through the breast Center in November.  She will return and see our breast survivorship nurse practitioner next year in November, and see me 2 years from now.   I'm not convinced but I am seeing on her face is vitiligo. It may be due to rosacea. I will try to get her to a dermatologist. It may be an issue because of insurance.   She knows to call for any problems that may develop before her next visit here.   MAGRINAT,GUSTAV C    02/17/2015

## 2015-02-21 ENCOUNTER — Other Ambulatory Visit: Payer: Self-pay | Admitting: Oncology

## 2015-02-21 DIAGNOSIS — Z1231 Encounter for screening mammogram for malignant neoplasm of breast: Secondary | ICD-10-CM

## 2015-02-21 DIAGNOSIS — Z9012 Acquired absence of left breast and nipple: Secondary | ICD-10-CM

## 2015-02-22 ENCOUNTER — Other Ambulatory Visit: Payer: Self-pay | Admitting: Oncology

## 2015-02-22 DIAGNOSIS — E2839 Other primary ovarian failure: Secondary | ICD-10-CM

## 2015-02-22 DIAGNOSIS — Z853 Personal history of malignant neoplasm of breast: Secondary | ICD-10-CM

## 2015-02-22 DIAGNOSIS — Z780 Asymptomatic menopausal state: Secondary | ICD-10-CM

## 2015-02-24 ENCOUNTER — Ambulatory Visit
Admission: RE | Admit: 2015-02-24 | Discharge: 2015-02-24 | Disposition: A | Discharge status: Home / Self Care | Payer: Commercial Managed Care - HMO | Source: Ambulatory Visit | Attending: Oncology | Admitting: Oncology

## 2015-02-24 DIAGNOSIS — Z1231 Encounter for screening mammogram for malignant neoplasm of breast: Secondary | ICD-10-CM

## 2015-02-24 DIAGNOSIS — Z9012 Acquired absence of left breast and nipple: Secondary | ICD-10-CM

## 2015-03-20 ENCOUNTER — Other Ambulatory Visit: Payer: Self-pay | Admitting: Oncology

## 2015-03-20 ENCOUNTER — Other Ambulatory Visit: Payer: Self-pay

## 2015-03-20 DIAGNOSIS — Z780 Asymptomatic menopausal state: Secondary | ICD-10-CM

## 2015-03-20 DIAGNOSIS — Z853 Personal history of malignant neoplasm of breast: Secondary | ICD-10-CM

## 2015-03-20 DIAGNOSIS — E2839 Other primary ovarian failure: Secondary | ICD-10-CM

## 2015-03-25 ENCOUNTER — Other Ambulatory Visit: Payer: Self-pay

## 2015-04-23 ENCOUNTER — Ambulatory Visit
Admission: RE | Admit: 2015-04-23 | Discharge: 2015-04-23 | Disposition: A | Discharge status: Home / Self Care | Payer: Commercial Managed Care - HMO | Source: Ambulatory Visit | Attending: Oncology | Admitting: Oncology

## 2015-04-23 DIAGNOSIS — E2839 Other primary ovarian failure: Secondary | ICD-10-CM

## 2015-04-23 DIAGNOSIS — Z853 Personal history of malignant neoplasm of breast: Secondary | ICD-10-CM

## 2015-04-23 DIAGNOSIS — Z780 Asymptomatic menopausal state: Secondary | ICD-10-CM

## 2015-04-26 ENCOUNTER — Encounter: Payer: Self-pay | Admitting: Oncology

## 2015-04-26 ENCOUNTER — Other Ambulatory Visit: Payer: Self-pay | Admitting: Oncology

## 2015-05-28 ENCOUNTER — Other Ambulatory Visit: Payer: Self-pay | Admitting: Oncology

## 2015-08-25 ENCOUNTER — Other Ambulatory Visit: Payer: Self-pay | Admitting: Oncology

## 2015-11-23 ENCOUNTER — Other Ambulatory Visit: Payer: Self-pay | Admitting: Oncology

## 2016-01-27 ENCOUNTER — Other Ambulatory Visit: Payer: Self-pay | Admitting: Internal Medicine

## 2016-01-27 DIAGNOSIS — Z1231 Encounter for screening mammogram for malignant neoplasm of breast: Secondary | ICD-10-CM

## 2016-02-17 ENCOUNTER — Other Ambulatory Visit: Payer: Self-pay | Admitting: Oncology

## 2016-02-17 NOTE — Telephone Encounter (Signed)
Chart reviewed.

## 2016-02-19 ENCOUNTER — Telehealth: Payer: Self-pay | Admitting: *Deleted

## 2016-02-19 ENCOUNTER — Encounter: Payer: Self-pay | Admitting: Adult Health

## 2016-02-19 NOTE — Telephone Encounter (Signed)
"  I just work up and missed appointment today at 11:00 am" Advised appointment has been rescheduled for 03-24-2017.  Also advised of Mammogram appointment on 02-25-2016.  No further questions.

## 2016-02-25 ENCOUNTER — Ambulatory Visit
Admission: RE | Admit: 2016-02-25 | Discharge: 2016-02-25 | Disposition: A | Discharge status: Home / Self Care | Payer: Commercial Managed Care - HMO | Source: Ambulatory Visit | Attending: Internal Medicine | Admitting: Internal Medicine

## 2016-02-25 DIAGNOSIS — Z1231 Encounter for screening mammogram for malignant neoplasm of breast: Secondary | ICD-10-CM

## 2016-03-24 ENCOUNTER — Encounter: Payer: Self-pay | Admitting: Adult Health

## 2016-03-24 ENCOUNTER — Ambulatory Visit (HOSPITAL_BASED_OUTPATIENT_CLINIC_OR_DEPARTMENT_OTHER): Payer: Commercial Managed Care - HMO | Admitting: Adult Health

## 2016-03-24 VITALS — BP 127/79 | HR 100 | Temp 98.0°F | Resp 18 | Wt 198.6 lb

## 2016-03-24 DIAGNOSIS — Z79811 Long term (current) use of aromatase inhibitors: Secondary | ICD-10-CM | POA: Diagnosis not present

## 2016-03-24 DIAGNOSIS — C50912 Malignant neoplasm of unspecified site of left female breast: Secondary | ICD-10-CM | POA: Diagnosis not present

## 2016-03-24 DIAGNOSIS — M8580 Other specified disorders of bone density and structure, unspecified site: Secondary | ICD-10-CM

## 2016-03-24 DIAGNOSIS — Z1231 Encounter for screening mammogram for malignant neoplasm of breast: Secondary | ICD-10-CM

## 2016-03-24 NOTE — Progress Notes (Signed)
CLINIC:  Survivorship   REASON FOR VISIT:  Routine follow-up for history of breast cancer.   BRIEF ONCOLOGIC HISTORY:  (From Dr. Virgie Dad last visit on 02/17/15)    INTERVAL HISTORY:  Sandy Morales presents to the Survivorship Clinic today for routine follow-up for her history of breast cancer.  Overall, she reports feeling quite well. She remains on the anastrozole with great tolerance. She has occasional night sweats, which have been chronic and are manageable. She has joint aches and pains which are better managed with Robaxin; tramadol is not effective in managing her arthralgias. She denies any vaginal dryness or vaginal bleeding.  Unrelated to her history of breast cancer, she does have chronic sinus issues. She takes Benadryl twice daily which helps with this. She struggles with her appetite at times, and will occasionally smoke marijuana to help with her appetite.  Otherwise she is largely without complaints today. Her last right breast unilateral mammogram was in 02/2016 and was negative. Her last DEXA scan was in 04/2015 and showed osteopenia. She takes calcium and vitamin D supplementation. She remains very active; she walks about 1 mile per day. 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    REVIEW OF SYSTEMS:  Review of Systems  HENT:   Positive for tinnitus.   Eyes: Negative.   Respiratory: Negative.   Cardiovascular: Negative.   Gastrointestinal: Negative.   Endocrine: Positive for hot flashes (Night sweats).  Genitourinary: Negative.  Negative for vaginal bleeding.   Musculoskeletal: Positive for arthralgias and myalgias.  Skin: Negative.   Neurological: Negative.   Hematological: Negative.   Psychiatric/Behavioral: Negative.   Breast: Denies any new nodularity, masses, tenderness, nipple changes, or nipple discharge.    A 14-point review of systems was completed and was  negative, except as noted above.    PAST MEDICAL/SURGICAL HISTORY:  Past Medical History:  Diagnosis Date  . Breast cancer   . Dyslipidemia   . GERD (gastroesophageal reflux disease)   . Gout   . Hypertension   . Osteoarthritis    Past Surgical History:  Procedure Laterality Date  . ABDOMINAL HYSTERECTOMY    . CYST EXCISION     rt middle finger  . INCISION AND DRAINAGE OF WOUND Left 12/16/2012   Procedure: INCISION AND DRAINAGE Hematoma IN CHEST with drain placement;  Surgeon: Crissie Reese, MD;  Location: [facility];  Service: Plastics;  Laterality: Left;  . KNEE SURGERY    . MASTECTOMY  04/27/2012    left breast  with tissue expander  . MASTECTOMY W/ SENTINEL NODE BIOPSY  04/27/2012   Procedure: MASTECTOMY WITH SENTINEL LYMPH NODE BIOPSY;  Surgeon: Joyice Faster. Cornett, MD;  Location: [facility];  Service: General;  Laterality: Left;  LEFT SIMPLE MASTECTOMY; SENTINEL LYMPH NODE BIOPSY  . TISSUE EXPANDER PLACEMENT  04/27/2012   Procedure: TISSUE EXPANDER;  Surgeon: Crissie Reese, MD;  Location: [facility];  Service: Plastics;  Laterality: Left;  PLACEMENT OF LEFT BREAST TISSUE EXPANDER     ALLERGIES:  No Known Allergies   CURRENT MEDICATIONS:  Outpatient Encounter Prescriptions as of 03/24/2016  Medication Sig  . anastrozole (ARIMIDEX) 1 MG tablet TAKE ONE TABLET BY MOUTH ONCE DAILY  . Calcium Carbonate (CALCIUM 600 PO) Take 600 mg by mouth daily.  . Cholecalciferol (VITAMIN D-3) 1000 UNITS CAPS Take 1 capsule by mouth daily.  . colchicine 0.6 MG tablet Take 0.6 mg  by mouth 2 (two) times daily.   Marland Kitchen docusate sodium 100 MG CAPS Take 100 mg by mouth daily. (Patient taking differently: Take 100 mg by mouth daily as needed (constipation). )  . famotidine (PEPCID) 20 MG tablet Take 20 mg by mouth 2 (two) times daily.  . pravastatin (PRAVACHOL) 80 MG tablet Take 80 mg by mouth daily.  . vitamin E 100 UNIT capsule Take 100 Units by mouth daily.  . diphenhydrAMINE (BENADRYL) 25 mg capsule Take 25 mg by  mouth every 6 (six) hours as needed for itching.  . potassium chloride (K-DUR) 10 MEQ tablet Take 2 tablets (20 mEq total) by mouth daily.  . [DISCONTINUED] ondansetron (ZOFRAN) 4 MG tablet Take 1 tablet (4 mg total) by mouth every 6 (six) hours. (Patient not taking: Reported on 03/24/2016)  . [DISCONTINUED] traMADol (ULTRAM) 50 MG tablet Take 50 mg by mouth 2 (two) times daily as needed.    No facility-administered encounter medications on file as of 03/24/2016.      ONCOLOGIC FAMILY HISTORY:  Family History  Problem Relation Age of Onset  . Cancer Mother     Unknown type of cancer  . Lung cancer Father     GENETIC COUNSELING/TESTING: No records available for review.  SOCIAL HISTORY:  Sandy Morales is is single and lives in [place], [place]. She has 3 sons, ages 45, 73, & 56. She has 16 grandchildren and 8 great grandchildren. Currently, her great niece lives with her, who is 6 years old.  Sandy Morales is retired; she previously worked for a [employer]. She denies any current tobacco or alcohol use. She smokes marijuana occasionally.   PHYSICAL EXAMINATION:  Vital Signs: Vitals:   03/24/16 1133  BP: 127/79  Pulse: 100  Resp: 18  Temp: 98 F (36.7 C)   Filed Weights   03/24/16 1133  Weight: 198 lb 9.6 oz (90.1 kg)   General: Well-nourished, well-appearing female in no acute distress.  Unaccompanied today.   HEENT: Head is normocephalic.  Pupils equal and reactive to light. Conjunctivae clear without exudate.  Sclerae anicteric. Oral mucosa is pink, moist.  Oropharynx is pink without lesions or erythema.  Lymph: No cervical, supraclavicular, or infraclavicular lymphadenopathy noted on palpation.  Cardiovascular: Regular rate and rhythm.Marland Kitchen Respiratory: Clear to auscultation bilaterally. Chest expansion symmetric; breathing non-labored.  Breast Exam:  -Left breast: s/p mastectomy with reconstruction. No appreciable masses on palpation. No skin redness, thickening,  or peau d'orange appearance; healed scar without erythema or nodularity.  -Right breast: No appreciable masses on palpation. No skin redness, thickening, or peau d'orange appearance; no nipple retraction or nipple discharge. -Axilla: No axillary adenopathy bilaterally.  GI: Abdomen soft and round; non-tender, non-distended. Bowel sounds normoactive. No hepatosplenomegaly.   GU: Deferred.  Neuro: No focal deficits. Steady gait.  Psych: Mood and affect normal and appropriate for situation.  Extremities: No edema. Skin: Warm and dry.  LABORATORY DATA:  None for this visit.   DIAGNOSTIC IMAGING:  Most recent mammogram: 02/25/16     ASSESSMENT AND PLAN:  Ms.. Garry is a pleasant 65 y.o. female with history of left breast DCIS, diagnosed in 04/2012.  She underwent left mastectomy and was found to have 1/4 SLNs positive for metastatic ductal carcinoma; there was no invasive cancer present in the breast tumor.  She underwent left breast reconstruction with placement of breast implant.  She started anti-estrogen therapy with Anastrozole in 08/2012. She presents to the Survivorship Clinic for surveillance and routine follow-up.  1. History of left breast cancer:  Ms. Danoff is currently clinically and radiographically without evidence of disease or recurrence of breast cancer. She will be due for unilateral right breast screening mammogram in 02/2017; orders placed today.  She will continue her anti-estrogen therapy with anastrozole, with plans to continue for a total of 5 years (completing in 08/2017).  She will return to the cancer center to see her medical oncologist, Dr. Jana Hakim, in 03/2017.  I encouraged her to call me with any questions or concerns before her next visit at the cancer center, and I would be happy to see her sooner, if needed.    2. Bone health:  Given Ms. Scoggin's age, history of breast cancer, and her current anti-estrogen therapy with anstrozole, she is at risk for bone  demineralization. Her last DEXA scan was on 04/23/15 and showed osteopenia.  She reports that she is taking calcium and vitamin D supplementation.  She also walks about 1 mile per day. Encouraged her to continue her supplementation and walking regimen.    3. Health maintenance and wellness promotion: Ms. Groenke was encouraged to consume 5-7 servings of fruits and vegetables per day. She was also encouraged to engage in moderate to vigorous exercise for 30 minutes per day most days of the week. She was instructed to limit her alcohol consumption and continue to abstain from tobacco use.    Dispo:  -Annual unilateral screening right breast mammogram due 02/2017; orders placed today.  -Return to cancer center to see Dr. Jana Hakim in 03/2017.   A total of 20 minutes of face-to-face time was spent with this patient with greater than 50% of that time in counseling and care-coordination.   Mike Craze, NP [facility] [phone]   Note: PRIMARY CARE PROVIDER Philis Fendt, [place] [phone]

## 2016-07-07 ENCOUNTER — Other Ambulatory Visit: Payer: Self-pay | Admitting: Adult Health

## 2016-07-07 DIAGNOSIS — Z1231 Encounter for screening mammogram for malignant neoplasm of breast: Secondary | ICD-10-CM

## 2016-07-07 DIAGNOSIS — Z9012 Acquired absence of left breast and nipple: Secondary | ICD-10-CM

## 2017-01-25 ENCOUNTER — Other Ambulatory Visit: Payer: Self-pay | Admitting: Internal Medicine

## 2017-01-25 DIAGNOSIS — Z1231 Encounter for screening mammogram for malignant neoplasm of breast: Secondary | ICD-10-CM

## 2017-02-11 ENCOUNTER — Other Ambulatory Visit: Payer: Self-pay | Admitting: *Deleted

## 2017-02-11 MED ORDER — ANASTROZOLE 1 MG PO TABS: 1.0000 mg | ORAL_TABLET | Freq: Every day | ORAL | 11 refills | Status: DC

## 2017-02-16 ENCOUNTER — Other Ambulatory Visit: Payer: Self-pay

## 2017-02-16 MED ORDER — ANASTROZOLE 1 MG PO TABS: 1.0000 mg | ORAL_TABLET | Freq: Every day | ORAL | 3 refills | Status: DC

## 2017-02-28 ENCOUNTER — Ambulatory Visit
Admission: RE | Admit: 2017-02-28 | Discharge: 2017-02-28 | Disposition: A | Discharge status: Home / Self Care | Payer: Medicare Other | Source: Ambulatory Visit | Attending: Internal Medicine | Admitting: Internal Medicine

## 2017-02-28 DIAGNOSIS — Z1231 Encounter for screening mammogram for malignant neoplasm of breast: Secondary | ICD-10-CM

## 2017-03-23 ENCOUNTER — Other Ambulatory Visit: Payer: Self-pay | Admitting: *Deleted

## 2017-03-23 DIAGNOSIS — C773 Secondary and unspecified malignant neoplasm of axilla and upper limb lymph nodes: Principal | ICD-10-CM

## 2017-03-23 DIAGNOSIS — C50919 Malignant neoplasm of unspecified site of unspecified female breast: Secondary | ICD-10-CM

## 2017-03-23 NOTE — Progress Notes (Signed)
ID: Crescent G Windholz   DOB: 11/26/1950  MR#: [id number]  CSN#:[id number]  PCP: Sami Hassan MD GYN:  SU: Thomas Cornett OTHER MD: Dr. Kelly Vollmer, Dr Stacy Wentworth, David Bowers  CHIEF COMPLAINT: left breast cancer  CURRENT TREATMENT: anastrozole  BREAST CANCER HISTORY:  from the original intake note:  [facility]illis [facility] heterogeneously dense breasts. In addition, in the left breast, new calcifications were noted. Left diagnostic mammography at the breast center 01/11/2012 confirmed amorphous calcifications in the left upper outer quadrant. Biopsy was performed 01/18/2012, and showed ([id number]) ductal carcinoma in situ, grade 3, strongly estrogen and progesterone receptor positive. The patient's subsequent history is as detailed below.  INTERVAL HISTORY: Cynai returns today for follow-up and treatment of her estrogen receptor positive breast cancer.  She continues on anastrozole, with good tolerance. She reports that she has some hot flashes, but they are not a major issue. She denies vaginal dryness. She pays $2.35 for this medication.   Since her last visit to the office, she underwent routine unilateral right mammography with CAD and without tomography on 02/28/2017 at [facility] showing: Breast density category C. There was no evidence of malignancy.  She also had a bone density scan 04/23/2015 with a T score of -2.2  REVIEW OF SYSTEMS: Chesley reports that she is well overall. She does some housework and yardwork. She reports that she is able to walk about half a mile. She notes that her right knee has arthritis in the knee cap. She is using a cane to help her walk. She denies unusual headaches, visual changes, nausea, vomiting, or dizziness. There has been no unusual cough, phlegm production, or pleurisy. This been no change in bowel or bladder habits. She denies unexplained fatigue or unexplained weight loss,  bleeding, rash, or fever. A detailed review of systems was otherwise stable.      PAST MEDICAL HISTORY: Past Medical History:  Diagnosis Date  . Breast cancer (HCC)   . Dyslipidemia   . GERD (gastroesophageal reflux disease)   . Gout   . Hypertension   . Osteoarthritis     PAST SURGICAL HISTORY: Past Surgical History:  Procedure Laterality Date  . ABDOMINAL HYSTERECTOMY    . CYST EXCISION     rt middle finger  . INCISION AND DRAINAGE OF WOUND Left 12/16/2012   Procedure: INCISION AND DRAINAGE Hematoma IN CHEST with drain placement;  Surgeon: David Bowers, MD;  [facility];  Service: Plastics;  Laterality: Left;  . KNEE SURGERY    . MASTECTOMY  04/27/2012    left breast  with tissue expander  . MASTECTOMY W/ SENTINEL NODE BIOPSY  04/27/2012   Procedure: MASTECTOMY WITH SENTINEL LYMPH NODE BIOPSY;  Surgeon: Thomas A. Cornett, MD;  Location[facility];  Service: General;  Laterality: Left;  LEFT SIMPLE MASTECTOMY; SENTINEL LYMPH NODE BIOPSY  . TISSUE EXPANDER PLACEMENT  04/27/2012   Procedure: TISSUE EXPANDER;  Surgeon: David Bowers, MD;  Location[facility];  Service: Plastics;  Laterality: Left;  PLACEMENT OF LEFT BREAST TISSUE EXPANDER    FAMILY HISTORY Family History  Problem Relation Age of Onset  . Cancer Mother        Unknown type of cancer  . Lung cancer Father    the patient's father died from lung cancer in the setting of tobacco abuse. The patient's mother died possibly from cancer as well. The patient is not quite sure of the age of death of   either of her parents. Ms. Clippinger had 3 brothers and 7 sisters. She is not aware of any breast or ovarian cancer history in the family.  GYNECOLOGIC HISTORY: First menstrual period age 39. First live birth age 62. She is GX P3. She underwent TAH-BSO in 1995. She never used hormone replacement.  SOCIAL HISTORY: (updated OCT 2013) Maudie worked for a [employer] for 32 years. 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 She has been widowed for approximately 11 years. She lives by herself. Her children are out Joanne Gavel, who is currently incarcerated; Suzie Vandam, who lives in [place] and works in Architect; and Dyneisha Murchison, who is disabled secondary to neuro fibromatosis. The patient has 16 grandchildren and 2 great-grandchildren. She attends a [employer], medical church.   ADVANCED DIRECTIVES: Not in place; the patient tells me she has talked with their sons Timthony and Coralyn Mark and that they would make decisions together if she became mantle incapacitated.  HEALTH MAINTENANCE: Social History   Tobacco Use  . Smoking status: Never Smoker  . Smokeless tobacco: Never Used  . Tobacco comment: Uses marijuana    Substance Use Topics  . Alcohol use: No    Comment: occasonal  . Drug use: Yes    Types: Marijuana     Colonoscopy: 2011  PAP: s/p hyst  Bone density: never  Lipid panel:  No Known Allergies  Current Outpatient Medications  Medication Sig Dispense Refill  . anastrozole (ARIMIDEX) 1 MG tablet Take 1 tablet (1 mg total) by mouth daily. 90 tablet 3  . Calcium Carbonate (CALCIUM 600 PO) Take 600 mg by mouth daily.    . Cholecalciferol (VITAMIN D-3) 1000 UNITS CAPS Take 1 capsule by mouth daily.    . colchicine 0.6 MG tablet Take 0.6 mg by mouth 2 (two) times daily.     . diphenhydrAMINE (BENADRYL) 25 mg capsule Take 25 mg by mouth every 6 (six) hours as needed for itching.    . docusate sodium 100 MG CAPS Take 100 mg by mouth daily. (Patient taking differently: Take 100 mg by mouth daily as needed (constipation). ) 10 capsule 0  . famotidine (PEPCID) 20 MG tablet Take 20 mg by mouth 2 (two) times daily.    . methocarbamol (ROBAXIN) 500 MG tablet Take 500 mg by mouth daily.    . potassium chloride (K-DUR) 10 MEQ tablet Take 2 tablets (20 mEq total) by mouth daily. 6 tablet 0  . pravastatin (PRAVACHOL) 80 MG tablet Take 80 mg by mouth daily.    . vitamin E 100  UNIT capsule Take 100 Units by mouth daily.     No current facility-administered medications for this visit.     OBJECTIVE: Middle-aged [place] American woman who appears stated age 66:   03/24/17 1144  BP: 120/82  Pulse: 76  Resp: 18  Temp: 98.4 F (36.9 C)  SpO2: 100%     Body mass index is 36.56 kg/m.    ECOG FS: 1  Sclerae unicteric, EOMs intact Oropharynx clear and moist No cervical or supraclavicular adenopathy Lungs no rales or rhonchi Heart regular rate and rhythm Abd soft, nontender, positive bowel sounds MSK no focal spinal tenderness, no upper extremity lymphedema, uses a cane to assist the right knee Neuro: nonfocal, well oriented, appropriate affect Breasts: The right breast is benign.  The left breast has undergone mastectomy and reconstruction.  There is no evidence of local recurrence.  Both axillae are benign.  LAB RESULTS: Lab Results  Component Value Date   WBC 5.9 03/24/2017   NEUTROABS 3.4 03/24/2017   HGB 11.0 (L) 03/24/2017   HCT 33.7 (L) 03/24/2017   MCV 97.2 03/24/2017   PLT 196 03/24/2017      Chemistry      Component Value Date/Time   NA 142 02/10/2015 1109   K 4.3 02/10/2015 1109   CL 112 05/26/2014 0410   CL 101 08/16/2012 1055   CO2 26 02/10/2015 1109   BUN 26.2 (H) 02/10/2015 1109   CREATININE 1.3 (H) 02/10/2015 1109      Component Value Date/Time   CALCIUM 10.0 02/10/2015 1109   ALKPHOS 102 02/10/2015 1109   AST 16 02/10/2015 1109   ALT 16 02/10/2015 1109   BILITOT 0.74 02/10/2015 1109       No results found for: LABCA2  No components found for: LABCA125  No results for input(s): INR in the last 168 hours.  Urinalysis    Component Value Date/Time   COLORURINE YELLOW 05/24/2014 2148   APPEARANCEUR CLEAR 05/24/2014 2148   LABSPEC 1.018 05/24/2014 2148   PHURINE 5.0 05/24/2014 2148   GLUCOSEU NEGATIVE 05/24/2014 2148   HGBUR NEGATIVE 05/24/2014 2148   HGBUR negative 05/26/2010 1051   [place] NEGATIVE  05/24/2014 2148   KETONESUR NEGATIVE 05/24/2014 2148   PROTEINUR NEGATIVE 05/24/2014 2148   UROBILINOGEN 0.2 05/24/2014 2148   NITRITE NEGATIVE 05/24/2014 2148   LEUKOCYTESUR SMALL (A) 05/24/2014 2148    STUDIES: Since her last visit to the office, she underwent routine unilateral right mammography with CAD and without tomography on 02/28/2017 at [facility] showing: Breast density category C. There was no evidence of malignancy.  She also had a bone density scan 04/23/2015 with a T score of -2.2  ASSESSMENT: 66 y.o. [place] woman status post left mastectomy and sentinel lymph node sampling 04/27/2012 for a pTis pN1, stage IIA invasive ductal carcinoma, grade 2, estrogen and progesterone receptors both 100% positive, with an MIB-1 of 36%, and no HER-2 amplification.  (1) anastrozole started may 2014  (2) osteopenia, with bone density October 2014 showing a T score of -1.1  (a) bone density January 2017 showing a T-score of -2.2  PLAN:  Debralee will soon be 5 years out from definitive surgery for her breast cancer with no evidence of disease recurrence.  This is very favorable.  She will complete 5 years of anastrozole in June.  I will see her at that time and she will be ready to "graduate" then.  She wanted to know her blood type.  She is O+.  She knows to call for any problems that may develop before her next visit.  Roshni Burbano, Virgie Dad, MD  03/24/17 12:08 PM Medical Oncology and Hematology [facility] [address] [place], [place] [address] Tel. [phone]    Fax. [phone]  This document serves as a record of services personally performed by Lurline Del, MD. It was created on his behalf by Sheron Nightingale, a trained medical scribe. The creation of this record is based on the scribe's personal observations and the provider's statements to them.   I have reviewed the above documentation for accuracy and completeness, and I agree with the  above.

## 2017-03-24 ENCOUNTER — Ambulatory Visit (HOSPITAL_BASED_OUTPATIENT_CLINIC_OR_DEPARTMENT_OTHER): Payer: Medicare Other | Admitting: Oncology

## 2017-03-24 ENCOUNTER — Other Ambulatory Visit (HOSPITAL_BASED_OUTPATIENT_CLINIC_OR_DEPARTMENT_OTHER): Payer: Medicare Other

## 2017-03-24 ENCOUNTER — Telehealth: Payer: Self-pay | Admitting: Oncology

## 2017-03-24 VITALS — BP 120/82 | HR 76 | Temp 98.4°F | Resp 18 | Ht 60.0 in | Wt 187.2 lb

## 2017-03-24 DIAGNOSIS — C773 Secondary and unspecified malignant neoplasm of axilla and upper limb lymph nodes: Principal | ICD-10-CM

## 2017-03-24 DIAGNOSIS — C50412 Malignant neoplasm of upper-outer quadrant of left female breast: Secondary | ICD-10-CM

## 2017-03-24 DIAGNOSIS — Z79811 Long term (current) use of aromatase inhibitors: Secondary | ICD-10-CM | POA: Diagnosis not present

## 2017-03-24 DIAGNOSIS — Z170 Estrogen receptor positive status [ER+]: Secondary | ICD-10-CM | POA: Diagnosis not present

## 2017-03-24 DIAGNOSIS — C50919 Malignant neoplasm of unspecified site of unspecified female breast: Secondary | ICD-10-CM

## 2017-03-24 DIAGNOSIS — M8580 Other specified disorders of bone density and structure, unspecified site: Secondary | ICD-10-CM

## 2017-03-24 DIAGNOSIS — C50912 Malignant neoplasm of unspecified site of left female breast: Secondary | ICD-10-CM

## 2017-03-24 LAB — CBC WITH DIFFERENTIAL/PLATELET
BASO%: 0.9 % (ref 0.0–2.0)
Basophils Absolute: 0.1 10*3/uL (ref 0.0–0.1)
EOS%: 5.3 % (ref 0.0–7.0)
Eosinophils Absolute: 0.3 10*3/uL (ref 0.0–0.5)
HCT: 33.7 % — ABNORMAL LOW (ref 34.8–46.6)
HGB: 11 g/dL — ABNORMAL LOW (ref 11.6–15.9)
LYMPH%: 26.5 % (ref 14.0–49.7)
MCH: 31.7 pg (ref 25.1–34.0)
MCHC: 32.6 g/dL (ref 31.5–36.0)
MCV: 97.2 fL (ref 79.5–101.0)
MONO#: 0.6 10*3/uL (ref 0.1–0.9)
MONO%: 9.4 % (ref 0.0–14.0)
NEUT#: 3.4 10*3/uL (ref 1.5–6.5)
NEUT%: 57.9 % (ref 38.4–76.8)
Platelets: 196 10*3/uL (ref 145–400)
RBC: 3.47 10*6/uL — ABNORMAL LOW (ref 3.70–5.45)
RDW: 14.8 % — ABNORMAL HIGH (ref 11.2–14.5)
WBC: 5.9 10*3/uL (ref 3.9–10.3)
lymph#: 1.6 10*3/uL (ref 0.9–3.3)

## 2017-03-24 LAB — COMPREHENSIVE METABOLIC PANEL
ALT: 19 U/L (ref 0–55)
AST: 20 U/L (ref 5–34)
Albumin: 3.8 g/dL (ref 3.5–5.0)
Alkaline Phosphatase: 110 U/L (ref 40–150)
Anion Gap: 10 mEq/L (ref 3–11)
BUN: 22.9 mg/dL (ref 7.0–26.0)
CO2: 23 mEq/L (ref 22–29)
Calcium: 9.4 mg/dL (ref 8.4–10.4)
Chloride: 109 mEq/L (ref 98–109)
Creatinine: 1.4 mg/dL — ABNORMAL HIGH (ref 0.6–1.1)
EGFR: 43 mL/min/{1.73_m2} — ABNORMAL LOW (ref >60)
Glucose: 87 mg/dl (ref 70–140)
Potassium: 4.6 mEq/L (ref 3.5–5.1)
Sodium: 142 mEq/L (ref 136–145)
Total Bilirubin: 0.62 mg/dL (ref 0.20–1.20)
Total Protein: 7.1 g/dL (ref 6.4–8.3)

## 2017-03-24 NOTE — Telephone Encounter (Signed)
Gave patient avs with appts per 12/6 los.

## 2017-09-23 ENCOUNTER — Other Ambulatory Visit: Payer: Self-pay | Admitting: *Deleted

## 2017-09-23 DIAGNOSIS — C50412 Malignant neoplasm of upper-outer quadrant of left female breast: Secondary | ICD-10-CM

## 2017-09-23 DIAGNOSIS — Z170 Estrogen receptor positive status [ER+]: Secondary | ICD-10-CM

## 2017-09-23 NOTE — Progress Notes (Signed)
No show

## 2017-09-26 ENCOUNTER — Inpatient Hospital Stay: Payer: Medicare Other

## 2017-09-26 ENCOUNTER — Inpatient Hospital Stay: Payer: Medicare Other | Attending: Oncology | Admitting: Oncology

## 2017-09-26 ENCOUNTER — Encounter: Payer: Self-pay | Admitting: Oncology

## 2017-10-07 ENCOUNTER — Telehealth: Payer: Self-pay | Admitting: Oncology

## 2017-10-07 NOTE — Telephone Encounter (Signed)
Patient called to reschedule  °

## 2017-11-06 NOTE — Progress Notes (Signed)
ID: Sandy Morales   DOB: Oct 30, 1950  MR#: [id number]  WCH#:[id number]  PCP: Antonietta Jewel MD GYN:  SU: Erroll Luna OTHER MD: Dr. Aldona Bar, Dr Thea Silversmith, Crissie Reese  CHIEF COMPLAINT: left breast cancer  CURRENT TREATMENT: Completing 5 years of anastrozole  BREAST CANCER HISTORY:  from the original intake note:  Sandy Morales had routine screening mammography at [facility] 01/04/2012 showing heterogeneously dense breasts. In addition, in the left breast, new calcifications were noted. Left diagnostic mammography at the breast center 01/11/2012 confirmed amorphous calcifications in the left upper outer quadrant. Biopsy was performed 01/18/2012, and showed ([id number]) ductal carcinoma in situ, grade 3, strongly estrogen and progesterone receptor positive. The patient's subsequent history is as detailed below.  INTERVAL HISTORY: Sandy Morales returns today for follow-up and treatment of her estrogen receptor positive breast cancer.  She is completing 5 years of anastrozole. She tolerated this well.   REVIEW OF SYSTEMS: Sandy Morales reports that she is taking care of her grandchildren and herself. She also cooks and does her own house work. For exercise, she walks about 5 houses down the street and back. She denies unusual headaches, visual changes, nausea, vomiting, or dizziness. There has been no unusual cough, phlegm production, or pleurisy. This been no change in bowel or bladder habits. She denies unexplained fatigue or unexplained weight loss, bleeding, rash, or fever. A detailed review of systems was otherwise stable.    PAST MEDICAL HISTORY: Past Medical History:  Diagnosis Date  . Breast cancer ([place])   . Dyslipidemia   . GERD (gastroesophageal reflux disease)   . Gout   . Hypertension   . Osteoarthritis     PAST SURGICAL HISTORY: Past Surgical History:  Procedure Laterality Date  . ABDOMINAL HYSTERECTOMY    . CYST EXCISION     rt middle finger  . INCISION AND  DRAINAGE OF WOUND Left 12/16/2012   Procedure: INCISION AND DRAINAGE Hematoma IN CHEST with drain placement;  Surgeon: Crissie Reese, MD;  Location: [place];  Service: Plastics;  Laterality: Left;  . KNEE SURGERY    . MASTECTOMY  04/27/2012    left breast  with tissue expander  . MASTECTOMY W/ SENTINEL NODE BIOPSY  04/27/2012   Procedure: MASTECTOMY WITH SENTINEL LYMPH NODE BIOPSY;  Surgeon: Joyice Faster. Cornett, MD;  Location: [place] [facility];  Service: General;  Laterality: Left;  LEFT SIMPLE MASTECTOMY; SENTINEL LYMPH NODE BIOPSY  . TISSUE EXPANDER PLACEMENT  04/27/2012   Procedure: TISSUE EXPANDER;  Surgeon: Crissie Reese, MD;  Location: [facility];  Service: Plastics;  Laterality: Left;  PLACEMENT OF LEFT BREAST TISSUE EXPANDER    FAMILY HISTORY Family History  Problem Relation Age of Onset  . Cancer Mother        Unknown type of cancer  . Lung cancer Father    the patient's father died from lung cancer in the setting of tobacco abuse. The patient's mother died possibly from cancer as well. The patient is not quite sure of the age of death of either of her parents. [name] had 3 brothers and 7 sisters. She is not aware of any breast or ovarian cancer history in the family.  GYNECOLOGIC HISTORY: First menstrual period age 26. First live birth age 84. She is GX P3. She underwent TAH-BSO in 1995. She never used hormone replacement.  SOCIAL HISTORY: (updated OCT 2013) Sandy Morales worked for a [employer] for 32 years. 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 She has been  widowed for approximately 11 years. She lives by herself. Her children are out Joanne Gavel, who is currently incarcerated; Verta Riedlinger, who lives in [place] and works in Architect; and Nelia Rogoff, who is disabled secondary to neuro fibromatosis. The patient has 16 grandchildren and 2 great-grandchildren. She attends a [employer]ic[employer], medical church.   ADVANCED DIRECTIVES: Not in place; the patient tells me  she has talked with their sons Timthony and Coralyn Mark and that they would make decisions together if she became mentally incapacitated.  HEALTH MAINTENANCE: Social History   Tobacco Use  . Smoking status: Never Smoker  . Smokeless tobacco: Never Used  . Tobacco comment: Uses marijuana    Substance Use Topics  . Alcohol use: No    Comment: occasonal  . Drug use: Yes    Types: Marijuana     Colonoscopy: 2011  PAP: s/p hyst  Bone density: never  Lipid panel:  No Known Allergies  Current Outpatient Medications  Medication Sig Dispense Refill  . anastrozole (ARIMIDEX) 1 MG tablet Take 1 tablet (1 mg total) by mouth daily. 90 tablet 3  . Calcium Carbonate (CALCIUM 600 PO) Take 600 mg by mouth daily.    . Cholecalciferol (VITAMIN D-3) 1000 UNITS CAPS Take 1 capsule by mouth daily.    . colchicine 0.6 MG tablet Take 0.6 mg by mouth 2 (two) times daily.     . diphenhydrAMINE (BENADRYL) 25 mg capsule Take 25 mg by mouth every 6 (six) hours as needed for itching.    . docusate sodium 100 MG CAPS Take 100 mg by mouth daily. (Patient taking differently: Take 100 mg by mouth daily as needed (constipation). ) 10 capsule 0  . famotidine (PEPCID) 20 MG tablet Take 20 mg by mouth 2 (two) times daily.    . methocarbamol (ROBAXIN) 500 MG tablet Take 500 mg by mouth daily.    . potassium chloride (K-DUR) 10 MEQ tablet Take 2 tablets (20 mEq total) by mouth daily. 6 tablet 0  . pravastatin (PRAVACHOL) 80 MG tablet Take 80 mg by mouth daily.    . vitamin E 100 UNIT capsule Take 100 Units by mouth daily.     No current facility-administered medications for this visit.     OBJECTIVE: Middle-aged Sandy Morales woman in no acute distress  Vitals:   11/07/17 1536  BP: 129/79  Pulse: 79  Resp: 18  Temp: 98.6 F (37 C)  SpO2: 100%     Body mass index is 35.94 kg/m.    ECOG FS: 2 Sclerae unicteric, pupils round and equal No cervical or supraclavicular adenopathy Lungs no rales or rhonchi Heart  regular rate and rhythm Abd soft, nontender, positive bowel sounds MSK no focal spinal tenderness, no upper extremity lymphedema; walks with a cane Neuro: nonfocal, well oriented, appropriate affect Breasts: Right breast is unremarkable.  The left breast is status post mastectomy followed by reconstruction.  There is no evidence of local recurrence.  Both axillae are benign.   LAB RESULTS: Lab Results  Component Value Date   WBC 5.0 11/07/2017   NEUTROABS 2.8 11/07/2017   HGB 11.8 11/07/2017   HCT 35.9 11/07/2017   MCV 98.1 11/07/2017   PLT 182 11/07/2017      Chemistry      Component Value Date/Time   NA 140 11/07/2017 1507   NA 142 03/24/2017 1109   K 4.2 11/07/2017 1507   K 4.6 03/24/2017 1109   CL 105 11/07/2017 1507   CL 101 08/16/2012 1055  CO2 27 11/07/2017 1507   CO2 23 03/24/2017 1109   BUN 26 (H) 11/07/2017 1507   BUN 22.9 03/24/2017 1109   CREATININE 1.71 (H) 11/07/2017 1507   CREATININE 1.4 (H) 03/24/2017 1109      Component Value Date/Time   CALCIUM 9.4 11/07/2017 1507   CALCIUM 9.4 03/24/2017 1109   ALKPHOS 117 11/07/2017 1507   ALKPHOS 110 03/24/2017 1109   AST 14 (L) 11/07/2017 1507   AST 20 03/24/2017 1109   ALT 12 11/07/2017 1507   ALT 19 03/24/2017 1109   BILITOT 0.7 11/07/2017 1507   BILITOT 0.62 03/24/2017 1109       No results found for: LABCA2  No components found for: LABCA125  No results for input(s): INR in the last 168 hours.  Urinalysis    Component Value Date/Time   COLORURINE YELLOW 05/24/2014 2148   APPEARANCEUR CLEAR 05/24/2014 2148   LABSPEC 1.018 05/24/2014 2148   PHURINE 5.0 05/24/2014 2148   GLUCOSEU NEGATIVE 05/24/2014 2148   HGBUR NEGATIVE 05/24/2014 2148   HGBUR negative 05/26/2010 1051   [place] NEGATIVE 05/24/2014 2148   [place] NEGATIVE 05/24/2014 2148   PROTEINUR NEGATIVE 05/24/2014 2148   UROBILINOGEN 0.2 05/24/2014 2148   NITRITE NEGATIVE 05/24/2014 2148   LEUKOCYTESUR SMALL (A) 05/24/2014 2148     STUDIES: She will be due for mammography in November 2019.  ASSESSMENT: 66 y.o. [place] woman status post left mastectomy and sentinel lymph node sampling 04/27/2012 for a pTis pN1, stage IIA invasive ductal carcinoma, grade 2, estrogen and progesterone receptors both 100% positive, with an MIB-1 of 36%, and no HER-2 amplification.  (1) anastrozole started May 2014, discontinued July 2019  (2) osteopenia, with bone density October 2014 showing a T score of -1.1  (a) bone density January 2017 showing a T-score of -2.2  PLAN:  Miki is 5-1/2 years out from definitive surgery for her breast cancer with no evidence of disease recurrence.  This is very favorable.  She has completed 5 years of anastrozole.  We are discontinuing this medication at present.  Continuing for an additional 2 years might only recur decrease the risk of metastatic breast cancer by 1% or perhaps even less.  She is very happy with this decision.  At this point I am comfortable releasing her to her primary care physician.  All she will need in terms of breast cancer follow-up is yearly mammography and a yearly physician breast exam.  In addition she is also interested in participating in our tai chi program.  And she would like to participate in our survivorship program.  I have laced the appropriate orders so she will see our survivorship nurse practitioner a year from now  I will be glad to see Aasiyah Morales in the future as needed but as of now I am making her no further routine appointments with me.  Magrinat, Virgie Dad, MD  11/07/17 3:54 PM Medical Oncology and Hematology [facility] [address] [place], [place] [address] Tel. [phone]    Fax. [phone]  Alice Rieger, am acting as scribe for Chauncey Cruel MD.  I, Lurline Del MD, have reviewed the above documentation for accuracy and completeness, and I agree with the above.

## 2017-11-07 ENCOUNTER — Telehealth: Payer: Self-pay | Admitting: Oncology

## 2017-11-07 ENCOUNTER — Inpatient Hospital Stay: Payer: Medicare Other

## 2017-11-07 ENCOUNTER — Inpatient Hospital Stay: Payer: Medicare Other | Attending: Oncology | Admitting: Oncology

## 2017-11-07 VITALS — BP 129/79 | HR 79 | Temp 98.6°F | Resp 18 | Ht 60.0 in | Wt 184.0 lb

## 2017-11-07 DIAGNOSIS — C50412 Malignant neoplasm of upper-outer quadrant of left female breast: Secondary | ICD-10-CM

## 2017-11-07 DIAGNOSIS — Z79899 Other long term (current) drug therapy: Secondary | ICD-10-CM | POA: Diagnosis not present

## 2017-11-07 DIAGNOSIS — Z170 Estrogen receptor positive status [ER+]: Secondary | ICD-10-CM

## 2017-11-07 DIAGNOSIS — Z853 Personal history of malignant neoplasm of breast: Secondary | ICD-10-CM | POA: Insufficient documentation

## 2017-11-07 DIAGNOSIS — Z801 Family history of malignant neoplasm of trachea, bronchus and lung: Secondary | ICD-10-CM | POA: Diagnosis not present

## 2017-11-07 DIAGNOSIS — C50912 Malignant neoplasm of unspecified site of left female breast: Secondary | ICD-10-CM

## 2017-11-07 DIAGNOSIS — C773 Secondary and unspecified malignant neoplasm of axilla and upper limb lymph nodes: Secondary | ICD-10-CM

## 2017-11-07 LAB — CBC WITH DIFFERENTIAL (CANCER CENTER ONLY)
Basophils Absolute: 0 10*3/uL (ref 0.0–0.1)
Basophils Relative: 0 %
Eosinophils Absolute: 0.3 10*3/uL (ref 0.0–0.5)
Eosinophils Relative: 7 %
HCT: 35.9 % (ref 34.8–46.6)
Hemoglobin: 11.8 g/dL (ref 11.6–15.9)
Lymphocytes Relative: 27 %
Lymphs Abs: 1.3 10*3/uL (ref 0.9–3.3)
MCH: 32.2 pg (ref 25.1–34.0)
MCHC: 32.9 g/dL (ref 31.5–36.0)
MCV: 98.1 fL (ref 79.5–101.0)
Monocytes Absolute: 0.5 10*3/uL (ref 0.1–0.9)
Monocytes Relative: 10 %
Neutro Abs: 2.8 10*3/uL (ref 1.5–6.5)
Neutrophils Relative %: 56 %
Platelet Count: 182 10*3/uL (ref 145–400)
RBC: 3.66 MIL/uL — ABNORMAL LOW (ref 3.70–5.45)
RDW: 12.9 % (ref 11.2–14.5)
WBC Count: 5 10*3/uL (ref 3.9–10.3)

## 2017-11-07 LAB — CMP (CANCER CENTER ONLY)
ALT: 12 U/L (ref 0–44)
AST: 14 U/L — ABNORMAL LOW (ref 15–41)
Albumin: 4 g/dL (ref 3.5–5.0)
Alkaline Phosphatase: 117 U/L (ref 38–126)
Anion gap: 8 (ref 5–15)
BUN: 26 mg/dL — ABNORMAL HIGH (ref 8–23)
CO2: 27 mmol/L (ref 22–32)
Calcium: 9.4 mg/dL (ref 8.9–10.3)
Chloride: 105 mmol/L (ref 98–111)
Creatinine: 1.71 mg/dL — ABNORMAL HIGH (ref 0.44–1.00)
GFR, Est AFR Am: 34 mL/min — ABNORMAL LOW (ref >60)
GFR, Estimated: 30 mL/min — ABNORMAL LOW (ref >60)
Glucose, Bld: 100 mg/dL — ABNORMAL HIGH (ref 70–99)
Potassium: 4.2 mmol/L (ref 3.5–5.1)
Sodium: 140 mmol/L (ref 135–145)
Total Bilirubin: 0.7 mg/dL (ref 0.3–1.2)
Total Protein: 7.3 g/dL (ref 6.5–8.1)

## 2017-11-07 NOTE — Telephone Encounter (Signed)
Gave avs and calendar ° °

## 2018-01-24 ENCOUNTER — Other Ambulatory Visit: Payer: Self-pay | Admitting: Internal Medicine

## 2018-01-24 DIAGNOSIS — Z1231 Encounter for screening mammogram for malignant neoplasm of breast: Secondary | ICD-10-CM

## 2018-03-01 ENCOUNTER — Ambulatory Visit
Admission: RE | Admit: 2018-03-01 | Discharge: 2018-03-01 | Disposition: A | Discharge status: Home / Self Care | Payer: Medicare Other | Source: Ambulatory Visit | Attending: Internal Medicine | Admitting: Internal Medicine

## 2018-03-01 DIAGNOSIS — Z1231 Encounter for screening mammogram for malignant neoplasm of breast: Secondary | ICD-10-CM

## 2018-10-19 ENCOUNTER — Telehealth: Payer: Self-pay | Admitting: Adult Health

## 2018-10-19 NOTE — Telephone Encounter (Signed)
I can not reach the patient regarding video visit

## 2018-10-24 ENCOUNTER — Telehealth: Payer: Self-pay | Admitting: Adult Health

## 2018-10-24 NOTE — Telephone Encounter (Signed)
I could not reach no voicemail available

## 2018-11-09 ENCOUNTER — Inpatient Hospital Stay: Payer: Medicare Other | Attending: Adult Health | Admitting: Adult Health

## 2018-11-09 DIAGNOSIS — Z91199 Patient's noncompliance with other medical treatment and regimen due to unspecified reason: Secondary | ICD-10-CM

## 2018-11-09 DIAGNOSIS — Z5329 Procedure and treatment not carried out because of patient's decision for other reasons: Secondary | ICD-10-CM

## 2018-11-09 NOTE — Progress Notes (Signed)
No show.  Patient called, no answer.  Did not arrive to my chart video.

## 2019-02-01 ENCOUNTER — Other Ambulatory Visit: Payer: Self-pay | Admitting: Internal Medicine

## 2019-02-01 DIAGNOSIS — Z1231 Encounter for screening mammogram for malignant neoplasm of breast: Secondary | ICD-10-CM

## 2019-03-21 ENCOUNTER — Other Ambulatory Visit: Payer: Self-pay

## 2019-03-21 ENCOUNTER — Ambulatory Visit
Admission: RE | Admit: 2019-03-21 | Discharge: 2019-03-21 | Disposition: A | Discharge status: Home / Self Care | Payer: Medicare Other | Source: Ambulatory Visit | Attending: Internal Medicine | Admitting: Internal Medicine

## 2019-03-21 DIAGNOSIS — Z1231 Encounter for screening mammogram for malignant neoplasm of breast: Secondary | ICD-10-CM

## 2019-03-22 ENCOUNTER — Other Ambulatory Visit: Payer: Self-pay | Admitting: Internal Medicine

## 2019-03-22 DIAGNOSIS — R928 Other abnormal and inconclusive findings on diagnostic imaging of breast: Secondary | ICD-10-CM

## 2019-03-28 ENCOUNTER — Other Ambulatory Visit: Payer: Self-pay

## 2019-03-28 ENCOUNTER — Ambulatory Visit
Admission: RE | Admit: 2019-03-28 | Discharge: 2019-03-28 | Disposition: A | Discharge status: Home / Self Care | Payer: Medicare Other | Source: Ambulatory Visit | Attending: Internal Medicine | Admitting: Internal Medicine

## 2019-03-28 ENCOUNTER — Other Ambulatory Visit: Payer: Self-pay | Admitting: Internal Medicine

## 2019-03-28 DIAGNOSIS — N6489 Other specified disorders of breast: Secondary | ICD-10-CM

## 2019-03-28 DIAGNOSIS — R928 Other abnormal and inconclusive findings on diagnostic imaging of breast: Secondary | ICD-10-CM

## 2019-09-27 ENCOUNTER — Other Ambulatory Visit: Payer: Self-pay

## 2019-09-27 ENCOUNTER — Other Ambulatory Visit: Payer: Self-pay | Admitting: Internal Medicine

## 2019-09-27 ENCOUNTER — Ambulatory Visit
Admission: RE | Admit: 2019-09-27 | Discharge: 2019-09-27 | Disposition: A | Discharge status: Home / Self Care | Payer: Medicare Other | Source: Ambulatory Visit | Attending: Internal Medicine | Admitting: Internal Medicine

## 2019-09-27 DIAGNOSIS — N6489 Other specified disorders of breast: Secondary | ICD-10-CM

## 2020-03-25 ENCOUNTER — Ambulatory Visit
Admission: RE | Admit: 2020-03-25 | Discharge: 2020-03-25 | Disposition: A | Discharge status: Home / Self Care | Payer: Medicare Other | Source: Ambulatory Visit | Attending: Internal Medicine | Admitting: Internal Medicine

## 2020-03-25 ENCOUNTER — Other Ambulatory Visit: Payer: Self-pay | Admitting: Internal Medicine

## 2020-03-25 ENCOUNTER — Other Ambulatory Visit: Payer: Self-pay

## 2020-03-25 DIAGNOSIS — N6489 Other specified disorders of breast: Secondary | ICD-10-CM

## 2020-04-10 ENCOUNTER — Ambulatory Visit
Admission: RE | Admit: 2020-04-10 | Discharge: 2020-04-10 | Disposition: A | Discharge status: Home / Self Care | Payer: Medicare Other | Source: Ambulatory Visit | Attending: Internal Medicine | Admitting: Internal Medicine

## 2020-04-10 ENCOUNTER — Other Ambulatory Visit: Payer: Self-pay

## 2020-04-10 DIAGNOSIS — N6489 Other specified disorders of breast: Secondary | ICD-10-CM

## 2020-05-19 ENCOUNTER — Ambulatory Visit: Payer: Self-pay | Admitting: Surgery

## 2020-05-19 DIAGNOSIS — D241 Benign neoplasm of right breast: Secondary | ICD-10-CM

## 2020-05-19 NOTE — H&P (Signed)
[facility] Appointment: 05/19/2020 9:40 AM Location: [facility] Patient #: [id number] DOB: [birth year] Single / Language: Cleophus Molt / Race: Black or African American Female  History of Present Illness Marcello Moores A. Elisheva Fallas MD; 05/19/2020 12:31 PM) Patient words: Patient presents for evaluation of a mammographic abnormality noted on recent screening mammogram of her right breast. She has a history of left breast cancer back in 2014 Mastectomy with reconstruction. She has had no complaints of breast pain, nipple discharge or change in either breast or the reconstruction site. Core biopsy was done to a subareolar lesion which showed flat atypia with papilloma. No history of breast mass nipple discharge or pain noted by patient      Follow-up for probably benign mass within the RIGHT breast. History of LEFT mastectomy.  EXAM: DIGITAL DIAGNOSTIC RIGHT MAMMOGRAM WITH CAD AND TOMO  ULTRASOUND RIGHT BREAST  COMPARISON: Previous exam(s).  ACR Breast Density Category c: The breast tissue is heterogeneously dense, which may obscure small masses.  FINDINGS: There are no new dominant masses, suspicious calcifications or secondary signs of malignancy within the RIGHT breast.  Mammographic images were processed with CAD.  Targeted ultrasound is performed, showing a complex cystic and solid mass within the retroareolar RIGHT breast, 4 o'clock axis, measuring 9 x 4 x 9 mm, with internal vascularity, not significantly changed in size compared to previous exams.  RIGHT axilla was evaluated with ultrasound showing no enlarged or morphologically abnormal lymph nodes.  IMPRESSION: Complex cystic and solid mass within the retroareolar RIGHT breast, 4 o'clock axis, measuring 9 mm, with internal vascularity. This is a suspicious finding for which ultrasound-guided biopsy is recommended.  RECOMMENDATION: Ultrasound-guided biopsy for the complex cystic and solid mass within the  retroareolar RIGHT breast, 4 o'clock axis, measuring 9 mm.  Ultrasound-guided biopsy is scheduled for December 23rd.  I have discussed the findings and recommendations with the patient. If applicable, a reminder letter will be sent to the patient regarding the next appointment.  BI-RADS CATEGORY 4: Suspicious.   Electronically Signed By: Franki Cabot M.D. On: 03/25/2020 12:00         Diagnosis Breast, right, needle core biopsy, 4:00 o'clock, retroareolar - FLAT EPITHELIAL ATYPIA WITH CALCIFICATIONS. 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  The patient is a 70 year old female.   Past Surgical History Malachi Bonds, CMA; 05/19/2020 9:37 AM) Hysterectomy (not due to cancer) - Complete  Diagnostic Studies History Malachi Bonds, CMA; 05/19/2020 9:37 AM) Colonoscopy >10 years ago  Allergies Malachi Bonds, CMA; 05/19/2020 9:38 AM) No Known Drug Allergies [05/19/2020]:  Medication History Malachi Bonds, CMA; 05/19/2020 9:45 AM) Famotidine (20MG  Tablet, Oral) Active. tiZANidine HCl (4MG  Tablet, Oral) Active. Allopurinol (100MG  Tablet, Oral) Active. Benazepril-hydroCHLOROthiazide (10-12.5MG  Tablet, Oral) Active. Pravastatin Sodium (80MG  Tablet, Oral) Active. Medications Reconciled  Social History Malachi Bonds, CMA; 05/19/2020 9:37 AM) Alcohol use Occasional alcohol use. Caffeine use Carbonated beverages, Coffee. Illicit drug use Prefer to discuss with provider.  Family History Malachi Bonds, [place]; 05/19/2020 9:37 AM) Family history unknown First Degree Relatives  Other Problems Malachi Bonds, CMA; 05/19/2020 9:37 AM) Arthritis Hypercholesterolemia     Review of Systems Malachi Bonds CMA; 05/19/2020 9:37 AM) General Not Present- Appetite Loss, Chills, Fatigue, Fever, Night Sweats, Weight Gain and Weight Loss. Skin Not Present- Change in Wart/Mole, Dryness, Hives, Jaundice, New Lesions, Non-Healing Wounds, Rash and  Ulcer. HEENT Present- Hearing Loss. Not Present- Earache, Hoarseness, Nose Bleed, Oral Ulcers, Ringing in the Ears, Seasonal Allergies, Sinus Pain, Sore Throat, Visual Disturbances, Wears  glasses/contact lenses and Yellow Eyes. Respiratory Not Present- Bloody sputum, Chronic Cough, Difficulty Breathing, Snoring and Wheezing. Breast Not Present- Breast Mass, Breast Pain, Nipple Discharge and Skin Changes. Cardiovascular Not Present- Chest Pain, Difficulty Breathing Lying Down, Leg Cramps, Palpitations, Rapid Heart Rate, Shortness of Breath and Swelling of Extremities. Gastrointestinal Not Present- Abdominal Pain, Bloating, Bloody Stool, Change in Bowel Habits, Chronic diarrhea, Constipation, Difficulty Swallowing, Excessive gas, Gets full quickly at meals, Hemorrhoids, Indigestion, Nausea, Rectal Pain and Vomiting. Female Genitourinary Not Present- Frequency, Nocturia, Painful Urination, Pelvic Pain and Urgency. Musculoskeletal Not Present- Back Pain, Joint Pain, Joint Stiffness, Muscle Pain, Muscle Weakness and Swelling of Extremities. Neurological Not Present- Decreased Memory, Fainting, Headaches, Numbness, Seizures, Tingling, Tremor, Trouble walking and Weakness. Psychiatric Not Present- Anxiety, Bipolar, Change in Sleep Pattern, Depression, Fearful and Frequent crying. Endocrine Present- Hot flashes. Not Present- Cold Intolerance, Excessive Hunger, Hair Changes, Heat Intolerance and New Diabetes. Hematology Not Present- Blood Thinners, Easy Bruising, Excessive bleeding, Gland problems, HIV and Persistent Infections.  Vitals (Chemira Jones CMA; 05/19/2020 9:38 AM) 05/19/2020 9:38 AM Weight: 179.4 lb Height: 60in Body Surface Area: 1.78 m Body Mass Index: 35.04 kg/m  BP: 130/80(Sitting, Left Arm, Standard)        Physical Exam (Santos Hardwick A. Suellen Durocher MD; 05/19/2020 12:32 PM)  General Mental Status-Alert. General Appearance-Consistent with stated age. Hydration-Well  hydrated. Voice-Normal.  Head and Neck Head-normocephalic, atraumatic with no lesions or palpable masses. Trachea-midline. Thyroid Gland Characteristics - normal size and consistency.  Breast Note: Left breast surgically absent. Reconstruction noted. No masses noted the left. Right breast is normal except for post biopsy change.  Neurologic Neurologic evaluation reveals -alert and oriented x 3 with no impairment of recent or remote memory. Mental Status-Normal.  Lymphatic Head & Neck  General Head & Neck Lymphatics: Bilateral - Description - Normal. Axillary  General Axillary Region: Bilateral - Description - Normal. Tenderness - Non Tender.    Assessment & Plan (Jie Stickels A. 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 Patient agrees to proceed due to atypia noted on core biopsy. Risk of lumpectomy include bleeding, infection, seroma, more surgery, use of seed/wire, wound care, cosmetic deformity and the need for other treatments, death , blood clots, death. Pt agrees to proceed.  total time 45 minutes  Current Plans Pt Education - [facility] General Post-op HCI You are being scheduled for surgery- Our schedulers will call you.  You should hear from our office's scheduling department within 5 working days about the location, date, and time of surgery. We try to make accommodations for patient's preferences in scheduling surgery, but sometimes the OR schedule or the surgeon's schedule prevents [place] from making those accommodations.  If you have not heard from our office [phone]) in 5 working days, call the office and ask for your surgeon's nurse.  If you have other questions about your diagnosis, plan, or surgery, call the office and ask for your surgeon's nurse.  Pt Education - Pamphlet Given - Breast Biopsy: discussed with patient and provided  information. Pt Education - [facility] Breast Biopsy HCI: discussed with patient and provided information.

## 2020-05-22 ENCOUNTER — Other Ambulatory Visit: Payer: Self-pay

## 2020-05-22 ENCOUNTER — Encounter (HOSPITAL_BASED_OUTPATIENT_CLINIC_OR_DEPARTMENT_OTHER): Payer: Self-pay | Admitting: Surgery

## 2020-05-23 ENCOUNTER — Other Ambulatory Visit (HOSPITAL_COMMUNITY): Payer: Medicare Other

## 2020-05-23 ENCOUNTER — Other Ambulatory Visit: Payer: Self-pay | Admitting: Surgery

## 2020-05-23 DIAGNOSIS — D241 Benign neoplasm of right breast: Secondary | ICD-10-CM

## 2020-06-18 ENCOUNTER — Other Ambulatory Visit: Payer: Self-pay

## 2020-06-18 ENCOUNTER — Encounter (HOSPITAL_BASED_OUTPATIENT_CLINIC_OR_DEPARTMENT_OTHER): Payer: Self-pay | Admitting: Surgery

## 2020-06-20 ENCOUNTER — Other Ambulatory Visit (HOSPITAL_COMMUNITY)
Admission: RE | Admit: 2020-06-20 | Discharge: 2020-06-20 | Disposition: A | Discharge status: Home / Self Care | Payer: Medicare Other | Source: Ambulatory Visit | Attending: Surgery | Admitting: Surgery

## 2020-06-20 ENCOUNTER — Encounter (HOSPITAL_BASED_OUTPATIENT_CLINIC_OR_DEPARTMENT_OTHER)
Admission: RE | Admit: 2020-06-20 | Discharge: 2020-06-20 | Disposition: A | Discharge status: Home / Self Care | Payer: Medicare Other | Source: Ambulatory Visit | Attending: Surgery | Admitting: Surgery

## 2020-06-20 DIAGNOSIS — D241 Benign neoplasm of right breast: Secondary | ICD-10-CM | POA: Insufficient documentation

## 2020-06-20 DIAGNOSIS — Z20822 Contact with and (suspected) exposure to covid-19: Secondary | ICD-10-CM | POA: Diagnosis not present

## 2020-06-20 DIAGNOSIS — Z01818 Encounter for other preprocedural examination: Secondary | ICD-10-CM | POA: Insufficient documentation

## 2020-06-20 DIAGNOSIS — Z01812 Encounter for preprocedural laboratory examination: Secondary | ICD-10-CM | POA: Insufficient documentation

## 2020-06-20 DIAGNOSIS — I10 Essential (primary) hypertension: Secondary | ICD-10-CM | POA: Insufficient documentation

## 2020-06-20 LAB — COMPREHENSIVE METABOLIC PANEL
ALT: 14 U/L (ref 0–44)
AST: 19 U/L (ref 15–41)
Albumin: 4.2 g/dL (ref 3.5–5.0)
Alkaline Phosphatase: 79 U/L (ref 38–126)
Anion gap: 11 (ref 5–15)
BUN: 18 mg/dL (ref 8–23)
CO2: 25 mmol/L (ref 22–32)
Calcium: 9.8 mg/dL (ref 8.9–10.3)
Chloride: 103 mmol/L (ref 98–111)
Creatinine, Ser: 1.28 mg/dL — ABNORMAL HIGH (ref 0.44–1.00)
GFR, Estimated: 45 mL/min — ABNORMAL LOW (ref >60)
Glucose, Bld: 128 mg/dL — ABNORMAL HIGH (ref 70–99)
Potassium: 4.2 mmol/L (ref 3.5–5.1)
Sodium: 139 mmol/L (ref 135–145)
Total Bilirubin: 1 mg/dL (ref 0.3–1.2)
Total Protein: 7.5 g/dL (ref 6.5–8.1)

## 2020-06-20 LAB — CBC WITH DIFFERENTIAL/PLATELET
Abs Immature Granulocytes: 0.02 10*3/uL (ref 0.00–0.07)
Basophils Absolute: 0 10*3/uL (ref 0.0–0.1)
Basophils Relative: 1 %
Eosinophils Absolute: 0.2 10*3/uL (ref 0.0–0.5)
Eosinophils Relative: 4 %
HCT: 38.6 % (ref 36.0–46.0)
Hemoglobin: 12.8 g/dL (ref 12.0–15.0)
Immature Granulocytes: 0 %
Lymphocytes Relative: 27 %
Lymphs Abs: 1.5 10*3/uL (ref 0.7–4.0)
MCH: 32.7 pg (ref 26.0–34.0)
MCHC: 33.2 g/dL (ref 30.0–36.0)
MCV: 98.7 fL (ref 80.0–100.0)
Monocytes Absolute: 0.8 10*3/uL (ref 0.1–1.0)
Monocytes Relative: 14 %
Neutro Abs: 3 10*3/uL (ref 1.7–7.7)
Neutrophils Relative %: 54 %
Platelets: 200 10*3/uL (ref 150–400)
RBC: 3.91 MIL/uL (ref 3.87–5.11)
RDW: 13 % (ref 11.5–15.5)
WBC: 5.6 10*3/uL (ref 4.0–10.5)
nRBC: 0 % (ref 0.0–0.2)

## 2020-06-20 LAB — SARS CORONAVIRUS 2 (TAT 6-24 HRS): SARS Coronavirus 2: NEGATIVE

## 2020-06-20 NOTE — Progress Notes (Signed)

## 2020-06-23 ENCOUNTER — Inpatient Hospital Stay: Admission: RE | Admit: 2020-06-23 | Payer: Medicare Other | Source: Ambulatory Visit

## 2020-06-24 ENCOUNTER — Ambulatory Visit (HOSPITAL_BASED_OUTPATIENT_CLINIC_OR_DEPARTMENT_OTHER): Admission: RE | Admit: 2020-06-24 | Payer: Medicare Other | Source: Home / Self Care | Admitting: Surgery

## 2020-06-24 DIAGNOSIS — D241 Benign neoplasm of right breast: Secondary | ICD-10-CM

## 2020-06-24 HISTORY — DX: Benign neoplasm of unspecified breast: D24.9

## 2020-06-24 SURGERY — BREAST LUMPECTOMY WITH RADIOACTIVE SEED LOCALIZATION
Anesthesia: General | Site: Breast | Laterality: Right

## 2020-07-10 ENCOUNTER — Encounter (HOSPITAL_BASED_OUTPATIENT_CLINIC_OR_DEPARTMENT_OTHER): Payer: Self-pay | Admitting: Surgery

## 2020-07-10 ENCOUNTER — Other Ambulatory Visit: Payer: Self-pay

## 2020-07-14 ENCOUNTER — Other Ambulatory Visit (HOSPITAL_COMMUNITY)
Admission: RE | Admit: 2020-07-14 | Discharge: 2020-07-14 | Disposition: A | Discharge status: Home / Self Care | Payer: Medicare Other | Source: Ambulatory Visit | Attending: Surgery | Admitting: Surgery

## 2020-07-14 ENCOUNTER — Encounter (HOSPITAL_BASED_OUTPATIENT_CLINIC_OR_DEPARTMENT_OTHER)
Admission: RE | Admit: 2020-07-14 | Discharge: 2020-07-14 | Disposition: A | Discharge status: Home / Self Care | Payer: Medicare Other | Source: Ambulatory Visit | Attending: Surgery | Admitting: Surgery

## 2020-07-14 DIAGNOSIS — Z01812 Encounter for preprocedural laboratory examination: Secondary | ICD-10-CM | POA: Insufficient documentation

## 2020-07-14 DIAGNOSIS — Z20822 Contact with and (suspected) exposure to covid-19: Secondary | ICD-10-CM | POA: Insufficient documentation

## 2020-07-14 LAB — BASIC METABOLIC PANEL
Anion gap: 9 (ref 5–15)
BUN: 28 mg/dL — ABNORMAL HIGH (ref 8–23)
CO2: 23 mmol/L (ref 22–32)
Calcium: 9.3 mg/dL (ref 8.9–10.3)
Chloride: 107 mmol/L (ref 98–111)
Creatinine, Ser: 1.39 mg/dL — ABNORMAL HIGH (ref 0.44–1.00)
GFR, Estimated: 41 mL/min — ABNORMAL LOW (ref >60)
Glucose, Bld: 103 mg/dL — ABNORMAL HIGH (ref 70–99)
Potassium: 4.3 mmol/L (ref 3.5–5.1)
Sodium: 139 mmol/L (ref 135–145)

## 2020-07-15 LAB — SARS CORONAVIRUS 2 (TAT 6-24 HRS): SARS Coronavirus 2: NEGATIVE

## 2020-07-16 ENCOUNTER — Ambulatory Visit
Admission: RE | Admit: 2020-07-16 | Discharge: 2020-07-16 | Disposition: A | Discharge status: Home / Self Care | Payer: Medicare Other | Source: Ambulatory Visit | Attending: Surgery | Admitting: Surgery

## 2020-07-16 ENCOUNTER — Other Ambulatory Visit: Payer: Self-pay

## 2020-07-16 DIAGNOSIS — D241 Benign neoplasm of right breast: Secondary | ICD-10-CM

## 2020-07-17 ENCOUNTER — Encounter (HOSPITAL_BASED_OUTPATIENT_CLINIC_OR_DEPARTMENT_OTHER)
Admission: RE | Disposition: A | Discharge status: Home / Self Care | Payer: Self-pay | Source: Home / Self Care | Attending: Surgery

## 2020-07-17 ENCOUNTER — Encounter (HOSPITAL_BASED_OUTPATIENT_CLINIC_OR_DEPARTMENT_OTHER): Payer: Self-pay | Admitting: Surgery

## 2020-07-17 ENCOUNTER — Ambulatory Visit (HOSPITAL_BASED_OUTPATIENT_CLINIC_OR_DEPARTMENT_OTHER)
Admission: RE | Admit: 2020-07-17 | Discharge: 2020-07-17 | Disposition: A | Discharge status: Home / Self Care | Payer: Medicare Other | Attending: Surgery | Admitting: Surgery

## 2020-07-17 ENCOUNTER — Ambulatory Visit
Admission: RE | Admit: 2020-07-17 | Discharge: 2020-07-17 | Disposition: A | Discharge status: Home / Self Care | Payer: Medicare Other | Source: Ambulatory Visit | Attending: Surgery | Admitting: Surgery

## 2020-07-17 ENCOUNTER — Other Ambulatory Visit: Payer: Self-pay

## 2020-07-17 ENCOUNTER — Ambulatory Visit (HOSPITAL_BASED_OUTPATIENT_CLINIC_OR_DEPARTMENT_OTHER): Payer: Medicare Other | Admitting: Anesthesiology

## 2020-07-17 DIAGNOSIS — D241 Benign neoplasm of right breast: Secondary | ICD-10-CM

## 2020-07-17 DIAGNOSIS — Z853 Personal history of malignant neoplasm of breast: Secondary | ICD-10-CM | POA: Diagnosis not present

## 2020-07-17 DIAGNOSIS — N6091 Unspecified benign mammary dysplasia of right breast: Secondary | ICD-10-CM | POA: Diagnosis not present

## 2020-07-17 DIAGNOSIS — Z79899 Other long term (current) drug therapy: Secondary | ICD-10-CM | POA: Insufficient documentation

## 2020-07-17 HISTORY — PX: BREAST LUMPECTOMY WITH RADIOACTIVE SEED LOCALIZATION: SHX6424

## 2020-07-17 HISTORY — PX: BREAST EXCISIONAL BIOPSY: SUR124

## 2020-07-17 SURGERY — BREAST LUMPECTOMY WITH RADIOACTIVE SEED LOCALIZATION
Anesthesia: General | Site: Breast | Laterality: Right

## 2020-07-17 MED ORDER — CHLORHEXIDINE GLUCONATE CLOTH 2 % EX PADS: 6.0000 | MEDICATED_PAD | Freq: Once | CUTANEOUS | Status: DC

## 2020-07-17 MED ORDER — MIDAZOLAM HCL 5 MG/5ML IJ SOLN
INTRAMUSCULAR | Status: DC | PRN
  Administered 2020-07-17 (×2): 1 mg via INTRAVENOUS

## 2020-07-17 MED ORDER — SODIUM CHLORIDE 0.9 % IV SOLN
INTRAVENOUS | Status: DC | PRN
  Administered 2020-07-17: 500 mL

## 2020-07-17 MED ORDER — FENTANYL CITRATE (PF) 100 MCG/2ML IJ SOLN
INTRAMUSCULAR | Status: AC
  Filled 2020-07-17: qty 2

## 2020-07-17 MED ORDER — BUPIVACAINE-EPINEPHRINE (PF) 0.25% -1:200000 IJ SOLN
INTRAMUSCULAR | Status: DC | PRN
  Administered 2020-07-17: 17 mL

## 2020-07-17 MED ORDER — ACETAMINOPHEN 500 MG PO TABS
ORAL_TABLET | ORAL | Status: AC
  Filled 2020-07-17: qty 2

## 2020-07-17 MED ORDER — FENTANYL CITRATE (PF) 100 MCG/2ML IJ SOLN
INTRAMUSCULAR | Status: DC | PRN
  Administered 2020-07-17: 25 ug via INTRAVENOUS
  Administered 2020-07-17: 50 ug via INTRAVENOUS
  Administered 2020-07-17 (×2): 25 ug via INTRAVENOUS

## 2020-07-17 MED ORDER — ONDANSETRON HCL 4 MG/2ML IJ SOLN
INTRAMUSCULAR | Status: DC | PRN
  Administered 2020-07-17: 4 mg via INTRAVENOUS

## 2020-07-17 MED ORDER — HYDROCODONE-ACETAMINOPHEN 5-325 MG PO TABS: 1.0000 | ORAL_TABLET | Freq: Four times a day (QID) | ORAL | 0 refills | Status: DC | PRN

## 2020-07-17 MED ORDER — CEFAZOLIN SODIUM-DEXTROSE 2-4 GM/100ML-% IV SOLN
2.0000 g | INTRAVENOUS | Status: AC
  Administered 2020-07-17: 2 g via INTRAVENOUS

## 2020-07-17 MED ORDER — LACTATED RINGERS IV SOLN: INTRAVENOUS | Status: DC

## 2020-07-17 MED ORDER — ACETAMINOPHEN 500 MG PO TABS
1000.0000 mg | ORAL_TABLET | Freq: Once | ORAL | Status: AC
  Administered 2020-07-17: 1000 mg via ORAL

## 2020-07-17 MED ORDER — CEFAZOLIN SODIUM-DEXTROSE 2-4 GM/100ML-% IV SOLN
INTRAVENOUS | Status: AC
  Filled 2020-07-17: qty 100

## 2020-07-17 MED ORDER — SODIUM CHLORIDE 0.9 % IV SOLN
INTRAVENOUS | Status: AC
  Filled 2020-07-17 (×2): qty 10

## 2020-07-17 MED ORDER — ONDANSETRON HCL 4 MG/2ML IJ SOLN: 4.0000 mg | Freq: Once | INTRAMUSCULAR | Status: DC | PRN

## 2020-07-17 MED ORDER — LIDOCAINE HCL 1 % IJ SOLN
INTRAMUSCULAR | Status: DC | PRN
  Administered 2020-07-17: 80 mg via INTRADERMAL
  Administered 2020-07-17: 20 mg via INTRADERMAL

## 2020-07-17 MED ORDER — PROPOFOL 10 MG/ML IV BOLUS
INTRAVENOUS | Status: DC | PRN
  Administered 2020-07-17: 20 mg via INTRAVENOUS
  Administered 2020-07-17: 150 mg via INTRAVENOUS
  Administered 2020-07-17: 20 mg via INTRAVENOUS

## 2020-07-17 MED ORDER — FENTANYL CITRATE (PF) 100 MCG/2ML IJ SOLN: 25.0000 ug | INTRAMUSCULAR | Status: DC | PRN

## 2020-07-17 MED ORDER — PROPOFOL 10 MG/ML IV BOLUS
INTRAVENOUS | Status: AC
  Filled 2020-07-17: qty 20

## 2020-07-17 MED ORDER — ONDANSETRON HCL 4 MG/2ML IJ SOLN
INTRAMUSCULAR | Status: AC
  Filled 2020-07-17: qty 2

## 2020-07-17 MED ORDER — MIDAZOLAM HCL 2 MG/2ML IJ SOLN
INTRAMUSCULAR | Status: AC
  Filled 2020-07-17: qty 2

## 2020-07-17 SURGICAL SUPPLY — 37 items
ADH SKN CLS APL DERMABOND .7 (GAUZE/BANDAGES/DRESSINGS) ×1
APL PRP STRL LF DISP 70% ISPRP (MISCELLANEOUS) ×1
BINDER BREAST XLRG (GAUZE/BANDAGES/DRESSINGS) ×3 IMPLANT
BINDER BREAST XXLRG (GAUZE/BANDAGES/DRESSINGS) IMPLANT
BLADE SURG 15 STRL LF DISP TIS (BLADE) ×1 IMPLANT
BLADE SURG 15 STRL SS (BLADE) ×3
CHLORAPREP W/TINT 26 (MISCELLANEOUS) ×3 IMPLANT
COVER BACK TABLE 60X90IN (DRAPES) ×3 IMPLANT
COVER MAYO STAND STRL (DRAPES) ×3 IMPLANT
COVER PROBE W GEL 5X96 (DRAPES) ×3 IMPLANT
DERMABOND ADVANCED (GAUZE/BANDAGES/DRESSINGS) ×2
DERMABOND ADVANCED .7 DNX12 (GAUZE/BANDAGES/DRESSINGS) ×1 IMPLANT
DRAPE LAPAROTOMY 100X72 PEDS (DRAPES) ×3 IMPLANT
DRAPE UTILITY XL STRL (DRAPES) ×3 IMPLANT
ELECT COATED BLADE 2.86 ST (ELECTRODE) ×3 IMPLANT
ELECT REM PT RETURN 9FT ADLT (ELECTROSURGICAL) ×3
ELECTRODE REM PT RTRN 9FT ADLT (ELECTROSURGICAL) ×1 IMPLANT
GLOVE SRG 8 PF TXTR STRL LF DI (GLOVE) ×1 IMPLANT
GLOVE SURG ENC MOIS LTX SZ6.5 (GLOVE) ×3 IMPLANT
GLOVE SURG LTX SZ8 (GLOVE) ×3 IMPLANT
GLOVE SURG UNDER POLY LF SZ7 (GLOVE) ×6 IMPLANT
GLOVE SURG UNDER POLY LF SZ8 (GLOVE) ×3
GOWN STRL REUS W/ TWL LRG LVL3 (GOWN DISPOSABLE) ×2 IMPLANT
GOWN STRL REUS W/ TWL XL LVL3 (GOWN DISPOSABLE) ×1 IMPLANT
GOWN STRL REUS W/TWL LRG LVL3 (GOWN DISPOSABLE) ×6
GOWN STRL REUS W/TWL XL LVL3 (GOWN DISPOSABLE) ×3
KIT MARKER MARGIN INK (KITS) ×3 IMPLANT
NEEDLE HYPO 25X1 1.5 SAFETY (NEEDLE) ×3 IMPLANT
PACK BASIN DAY SURGERY FS (CUSTOM PROCEDURE TRAY) ×3 IMPLANT
PENCIL SMOKE EVACUATOR (MISCELLANEOUS) ×3 IMPLANT
SLEEVE SCD COMPRESS KNEE MED (STOCKING) ×3 IMPLANT
SPONGE LAP 4X18 RFD (DISPOSABLE) ×3 IMPLANT
SUT MNCRL AB 4-0 PS2 18 (SUTURE) ×3 IMPLANT
SUT VICRYL 3-0 CR8 SH (SUTURE) ×3 IMPLANT
SYR CONTROL 10ML LL (SYRINGE) ×3 IMPLANT
TOWEL GREEN STERILE FF (TOWEL DISPOSABLE) ×3 IMPLANT
TRAY FAXITRON CT DISP (TRAY / TRAY PROCEDURE) ×3 IMPLANT

## 2020-07-17 NOTE — Interval H&P Note (Signed)
History and Physical Interval Note:  07/17/2020 7:23 AM  Sandy Morales  has presented today for surgery, with the diagnosis of RIGHT BREAST ATYPICAL PAPILLOMA.  The various methods of treatment have been discussed with the patient and family. After consideration of risks, benefits and other options for treatment, the patient has consented to  Procedure(s) with comments: RIGHT BREAST LUMPECTOMY WITH RADIOACTIVE SEED LOCALIZATION (Right) - 60 MINUTES as a surgical intervention.  The patient's history has been reviewed, patient examined, no change in status, stable for surgery.  I have reviewed the patient's chart and labs.  Questions were answered to the patient's satisfaction.     [place]

## 2020-07-17 NOTE — Op Note (Signed)
Preoperative diagnosis: Atypical right breast papilloma  Postoperative diagnosis: Same  Procedure: Right breast seed localized lumpectomy  Surgeon: Erroll Luna, MD  Anesthesia: LMA with 0.25% Marcaine plain local  EBL: 10 cc  Specimen: Right breast tissue with seed and clip verified by Faxitron  Indications for procedure: The patient is a 70 year old female with a history of left breast cancer.  She presents due to an abnormal mammogram of the right.  Core biopsy was done which showed flat atypia associated with a papilloma.  Given her past history she opted for right breast lumpectomy.The procedure has been discussed with the patient. Alternatives to surgery have been discussed with the patient.  Risks of surgery include bleeding,  Infection,  Seroma formation, death,  and the need for further surgery.   The patient understands and wishes to proceed.  Description of procedure: The patient was met in the holding area and all questions were answered.  Neoprobe used to verify seed location right breast.  She was then taken back to the operating room.  She was placed supine upon the OR table.  After induction of general esthesia, right breast was prepped and draped in sterile fashion and timeout performed.  She received appropriate antibiotics.  Proper patient, site and procedure verified.  Neoprobe used.  Signal identified along the inferior border of the nipple areolar complex.  Curvilinear incision was made there.  Dissection was carried down all tissue around the seed and clip were excised with a grossly negative margins.  The Faxitron image revealed the seed clip to be present.  Irrigation used.  Local anesthetic infiltrated.  Hemostasis achieved with cautery.  Wound closed with 3-0 Vicryl and 4 Monocryl.  Dermabond applied.  All counts were found to be correct.  The patient was awoke extubated taken to recovery in satisfactory condition.

## 2020-07-17 NOTE — Anesthesia Procedure Notes (Signed)
Procedure Name: LMA Insertion Date/Time: 07/17/2020 7:53 AM Performed by: Garrel Ridgel, CRNA Pre-anesthesia Checklist: Patient identified, Emergency Drugs available, Suction available and Patient being monitored Patient Re-evaluated:Patient Re-evaluated prior to induction Oxygen Delivery Method: Circle system utilized Preoxygenation: Pre-oxygenation with 100% oxygen Induction Type: IV induction Ventilation: Mask ventilation without difficulty LMA: LMA inserted LMA Size: 4.0 Number of attempts: 1 Placement Confirmation: positive ETCO2 Tube secured with: Tape

## 2020-07-17 NOTE — Transfer of Care (Signed)
Immediate Anesthesia Transfer of Care Note  Patient: Sandy Morales  Procedure(s) Performed: RIGHT BREAST LUMPECTOMY WITH RADIOACTIVE SEED LOCALIZATION (Right Breast)  Patient Location: PACU  Anesthesia Type:General  Level of Consciousness: awake, alert , oriented and patient cooperative  Airway & Oxygen Therapy: Patient Spontanous Breathing and Patient connected to face mask oxygen  Post-op Assessment: Report given to RN and Post -op Vital signs reviewed and stable  Post vital signs: Reviewed and stable  Last Vitals:  Vitals Value Taken Time  BP 171/90 07/17/20 0900  Temp 36.4 C 07/17/20 0839  Pulse 70 07/17/20 0903  Resp 11 07/17/20 0903  SpO2 97 % 07/17/20 0903  Vitals shown include unvalidated device data.  Last Pain:  Vitals:   07/17/20 0900  TempSrc:   PainSc: 0-No pain      Patients Stated Pain Goal: 4 (51/46/04 7998)  Complications: No complications documented.

## 2020-07-17 NOTE — H&P (Signed)
Sandy Morales  Location: [facility] Patient #: [id number] DOB: [birth year] Single / Language: Cleophus Molt / Race: Black or African American Female  History of Present Illness Patient words: Patient presents for evaluation of a mammographic abnormality noted on recent screening mammogram of her right breast. She has a history of left breast cancer back in 2014 Mastectomy with reconstruction. She has had no complaints of breast pain, nipple discharge or change in either breast or the reconstruction site. Core biopsy was done to a subareolar lesion which showed flat atypia with papilloma. No history of breast mass nipple discharge or pain noted by patient      Follow-up for probably benign mass within the RIGHT breast. History of LEFT mastectomy.  EXAM: DIGITAL DIAGNOSTIC RIGHT MAMMOGRAM WITH CAD AND TOMO  ULTRASOUND RIGHT BREAST  COMPARISON: Previous exam(s).  ACR Breast Density Category c: The breast tissue is heterogeneously dense, which may obscure small masses.  FINDINGS: There are no new dominant masses, suspicious calcifications or secondary signs of malignancy within the RIGHT breast.  Mammographic images were processed with CAD.  Targeted ultrasound is performed, showing a complex cystic and solid mass within the retroareolar RIGHT breast, 4 o'clock axis, measuring 9 x 4 x 9 mm, with internal vascularity, not significantly changed in size compared to previous exams.  RIGHT axilla was evaluated with ultrasound showing no enlarged or morphologically abnormal lymph nodes.  IMPRESSION: Complex cystic and solid mass within the retroareolar RIGHT breast, 4 o'clock axis, measuring 9 mm, with internal vascularity. This is a suspicious finding for which ultrasound-guided biopsy is recommended.  RECOMMENDATION: Ultrasound-guided biopsy for the complex cystic and solid mass within the retroareolar RIGHT breast, 4 o'clock axis, measuring  9 mm.  Ultrasound-guided biopsy is scheduled for December 23rd.  I have discussed the findings and recommendations with the patient. If applicable, a reminder letter will be sent to the patient regarding the next appointment.  BI-RADS CATEGORY 4: Suspicious.   Electronically Signed By: Franki Cabot M.D. On: 03/25/2020 12:00         Diagnosis Breast, right, needle core biopsy, 4:00 o'clock, retroareolar - FLAT EPITHELIAL ATYPIA WITH CALCIFICATIONS. 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  The patient is a 70 year old female.   Past Surgical History  Hysterectomy (not due to cancer) - Complete  Diagnostic Studies History Colonoscopy >10 years ago  Allergies  No Known Drug Allergies Medication History Famotidine (20MG  Tablet, Oral) Active. tiZANidine HCl (4MG  Tablet, Oral) Active. Allopurinol (100MG  Tablet, Oral) Active. Benazepril-hydroCHLOROthiazide (10-12.5MG  Tablet, Oral) Active. Pravastatin Sodium (80MG  Tablet, Oral) Active. Medications Reconciled  Social History Alcohol use Occasional alcohol use. Caffeine use Carbonated beverages, Coffee. Illicit drug use Prefer to discuss with provider.  Family History Family history unknown First Degree Relatives  Other Problems  Arthritis Hypercholesterolemia     Review of Systems General Not Present- Appetite Loss, Chills, Fatigue, Fever, Night Sweats, Weight Gain and Weight Loss. Skin Not Present- Change in Wart/Mole, Dryness, Hives, Jaundice, New Lesions, Non-Healing Wounds, Rash and Ulcer. HEENT Present- Hearing Loss. Not Present- Earache, Hoarseness, Nose Bleed, Oral Ulcers, Ringing in the Ears, Seasonal Allergies, Sinus Pain, Sore Throat, Visual Disturbances, Wears glasses/contact lenses and Yellow Eyes. Respiratory Not Present- Bloody sputum, Chronic Cough, Difficulty Breathing, Snoring and Wheezing. Breast Not Present- Breast Mass, Breast  Pain, Nipple Discharge and Skin Changes. Cardiovascular Not Present- Chest Pain, Difficulty Breathing Lying Down, Leg Cramps, Palpitations, Rapid Heart Rate, Shortness of Breath and Swelling of Extremities. Gastrointestinal Not Present- Abdominal Pain, Bloating,  Bloody Stool, Change in Bowel Habits, Chronic diarrhea, Constipation, Difficulty Swallowing, Excessive gas, Gets full quickly at meals, Hemorrhoids, Indigestion, Nausea, Rectal Pain and Vomiting. Female Genitourinary Not Present- Frequency, Nocturia, Painful Urination, Pelvic Pain and Urgency. Musculoskeletal Not Present- Back Pain, Joint Pain, Joint Stiffness, Muscle Pain, Muscle Weakness and Swelling of Extremities. Neurological Not Present- Decreased Memory, Fainting, Headaches, Numbness, Seizures, Tingling, Tremor, Trouble walking and Weakness. Psychiatric Not Present- Anxiety, Bipolar, Change in Sleep Pattern, Depression, Fearful and Frequent crying. Endocrine Present- Hot flashes. Not Present- Cold Intolerance, Excessive Hunger, Hair Changes, Heat Intolerance and New Diabetes. Hematology Not Present- Blood Thinners, Easy Bruising, Excessive bleeding, Gland problems, HIV and Persistent Infections.  Vitals  Weight: 179.4 lb Height: 60in Body Surface Area: 1.78 m Body Mass Index: 35.04 kg/m  BP: 130/80(Sitting, Left Arm, Standard)        Physical Exam   General Mental Status-Alert. General Appearance-Consistent with stated age. Hydration-Well hydrated. Voice-Normal.  Head and Neck Head-normocephalic, atraumatic with no lesions or palpable masses. Trachea-midline. Thyroid Gland Characteristics - normal size and consistency.  Breast Note: Left breast surgically absent. Reconstruction noted. No masses noted the left. Right breast is normal except for post biopsy change.  Neurologic Neurologic evaluation reveals -alert and oriented x 3 with no impairment of recent or remote  memory. Mental Status-Normal.  Lymphatic Head & Neck  General Head & Neck Lymphatics: Bilateral - Description - Normal. Axillary  General Axillary Region: Bilateral - Description - Normal. Tenderness - Non Tender.    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 Patient agrees to proceed due to atypia noted on core biopsy. Risk of lumpectomy include bleeding, infection, seroma, more surgery, use of seed/wire, wound care, cosmetic deformity and the need for other treatments, death , blood clots, death. Pt agrees to proceed.  total time 45 minutes  Current Plans Pt Education - [facility] General Post-op HCI You are being scheduled for surgery- Our schedulers will call you.  You should hear from our office's scheduling department within 5 working days about the location, date, and time of surgery. We try to make accommodations for patient's preferences in scheduling surgery, but sometimes the OR schedule or the surgeon's schedule prevents [place] from making those accommodations.  If you have not heard from our office [phone]) in 5 working days, call the office and ask for your surgeon's nurse.  If you have other questions about your diagnosis, plan, or surgery, call the office and ask for your surgeon's nurse.  Pt Education - Pamphlet Given - Breast Biopsy: discussed with patient and provided information. Pt Education - [facility] Breast Biopsy HCI: discussed with patient and provided information.

## 2020-07-17 NOTE — Discharge Instructions (Signed)
[place] Office Phone Number [phone]  BREAST BIOPSY/ PARTIAL MASTECTOMY: POST OP INSTRUCTIONS  Always review your discharge instruction sheet given to you by the facility where your surgery was performed.  IF YOU HAVE DISABILITY OR FAMILY LEAVE FORMS, YOU MUST BRING THEM TO THE OFFICE FOR PROCESSING.  DO NOT GIVE THEM TO YOUR DOCTOR.  1. A prescription for pain medication may be given to you upon discharge.  Take your pain medication as prescribed, if needed.  If narcotic pain medicine is not needed, then you may take acetaminophen (Tylenol) or ibuprofen (Advil) as needed. 2. Take your usually prescribed medications unless otherwise directed 3. If you need a refill on your pain medication, please contact your pharmacy.  They will contact our office to request authorization.  Prescriptions will not be filled after 5pm or on week-ends. 4. You should eat very light the first 24 hours after surgery, such as soup, crackers, pudding, etc.  Resume your normal diet the day after surgery. 5. Most patients will experience some swelling and bruising in the breast.  Ice packs and a good support bra will help.  Swelling and bruising can take several days to resolve.  6. It is common to experience some constipation if taking pain medication after surgery.  Increasing fluid intake and taking a stool softener will usually help or prevent this problem from occurring.  A mild laxative (Milk of Magnesia or Miralax) should be taken according to package directions if there are no bowel movements after 48 hours. 7. Unless discharge instructions indicate otherwise, you may remove your bandages 24-48 hours after surgery, and you may shower at that time.  You may have steri-strips (small skin tapes) in place directly over the incision.  These strips should be left on the skin for 7-10 days.  If your surgeon used skin glue on the incision, you may shower in 24 hours.  The glue will flake off over the  next 2-3 weeks.  Any sutures or staples will be removed at the office during your follow-up visit. 8. ACTIVITIES:  You may resume regular daily activities (gradually increasing) beginning the next day.  Wearing a good support bra or sports bra minimizes pain and swelling.  You may have sexual intercourse when it is comfortable. a. You may drive when you no longer are taking prescription pain medication, you can comfortably wear a seatbelt, and you can safely maneuver your car and apply brakes. b. RETURN TO WORK:  ______________________________________________________________________________________ 9. You should see your doctor in the office for a follow-up appointment approximately two weeks after your surgery.  Your doctor's nurse will typically make your follow-up appointment when she calls you with your pathology report.  Expect your pathology report 2-3 business days after your surgery.  You may call to check if you do not hear from [place] after three days. 10. OTHER INSTRUCTIONS: _______________________________________________________________________________________________ _____________________________________________________________________________________________________________________________________ _____________________________________________________________________________________________________________________________________ _____________________________________________________________________________________________________________________________________  WHEN TO CALL YOUR DOCTOR: 1. Fever over 101.0 2. Nausea and/or vomiting. 3. Extreme swelling or bruising. 4. Continued bleeding from incision. 5. Increased pain, redness, or drainage from the incision.  The clinic staff is available to answer your questions during regular business hours.  Please don't hesitate to call and ask to speak to one of the nurses for clinical concerns.  If you have a medical emergency, go to the nearest  emergency room or call 911.  A surgeon from [facility] is always on call at the hospital.  For further questions, please visit [email or web address]  No Tylenol before 12:30pm.   Post Anesthesia Home Care Instructions  Activity: Get plenty of rest for the remainder of the day. A responsible individual must stay with you for 24 hours following the procedure.  For the next 24 hours, DO NOT: -Drive a car -Paediatric nurse -Drink alcoholic beverages -Take any medication unless instructed by your physician -Make any legal decisions or sign important papers.  Meals: Start with liquid foods such as gelatin or soup. Progress to regular foods as tolerated. Avoid greasy, spicy, heavy foods. If nausea and/or vomiting occur, drink only clear liquids until the nausea and/or vomiting subsides. Call your physician if vomiting continues.  Special Instructions/Symptoms: Your throat may feel dry or sore from the anesthesia or the breathing tube placed in your throat during surgery. If this causes discomfort, gargle with warm salt water. The discomfort should disappear within 24 hours.  If you had a scopolamine patch placed behind your ear for the management of post- operative nausea and/or vomiting:  1. The medication in the patch is effective for 72 hours, after which it should be removed.  Wrap patch in a tissue and discard in the trash. Wash hands thoroughly with soap and water. 2. You may remove the patch earlier than 72 hours if you experience unpleasant side effects which may include dry mouth, dizziness or visual disturbances. 3. Avoid touching the patch. Wash your hands with soap and water after contact with the patch.

## 2020-07-17 NOTE — Anesthesia Postprocedure Evaluation (Signed)
Anesthesia Post Note  Patient: Evalina Field  Procedure(s) Performed: RIGHT BREAST LUMPECTOMY WITH RADIOACTIVE SEED LOCALIZATION (Right Breast)     Patient location during evaluation: PACU Anesthesia Type: General Level of consciousness: awake and alert, oriented and awake Pain management: pain level controlled Vital Signs Assessment: post-procedure vital signs reviewed and stable Respiratory status: spontaneous breathing, nonlabored ventilation and respiratory function stable Cardiovascular status: blood pressure returned to baseline and stable Postop Assessment: no apparent nausea or vomiting Anesthetic complications: no   No complications documented.  Last Vitals:  Vitals:   07/17/20 0900 07/17/20 0920  BP: (!) 171/90 136/81  Pulse: 69 68  Resp: (!) 8 12  Temp:  36.4 C  SpO2: 99% 95%    Last Pain:  Vitals:   07/17/20 0920  TempSrc:   PainSc: 0-No pain                 Catalina Gravel

## 2020-07-17 NOTE — Anesthesia Preprocedure Evaluation (Addendum)
Anesthesia Evaluation  Patient identified by MRN, date of birth, ID band Patient awake    Reviewed: Allergy & Precautions, NPO status , Patient's Chart, lab work & pertinent test results  History of Anesthesia Complications Negative for: history of anesthetic complications  Airway Mallampati: II  TM Distance: >3 FB Neck ROM: Full    Dental  (+) Dental Advisory Given, Missing, Poor Dentition, Chipped   Pulmonary neg pulmonary ROS,    Pulmonary exam normal breath sounds clear to auscultation       Cardiovascular hypertension, Pt. on medications Normal cardiovascular exam Rhythm:Regular Rate:Normal     Neuro/Psych negative neurological ROS     GI/Hepatic Neg liver ROS, GERD  Medicated and Controlled,  Endo/Other  negative endocrine ROSObesity   Renal/GU negative Renal ROS     Musculoskeletal  (+) Arthritis ,   Abdominal   Peds  Hematology negative hematology ROS (+)   Anesthesia Other Findings Day of surgery medications reviewed with the patient.  H/o left mastectomy  Right breast papilloma   Reproductive/Obstetrics                            Anesthesia Physical Anesthesia Plan  ASA: II  Anesthesia Plan: General   Post-op Pain Management:    Induction: Intravenous  PONV Risk Score and Plan: 3 and Dexamethasone and Ondansetron  Airway Management Planned: LMA  Additional Equipment:   Intra-op Plan:   Post-operative Plan: Extubation in OR  Informed Consent: I have reviewed the patients History and Physical, chart, labs and discussed the procedure including the risks, benefits and alternatives for the proposed anesthesia with the patient or authorized representative who has indicated his/her understanding and acceptance.     Dental advisory given  Plan Discussed with: CRNA  Anesthesia Plan Comments:         Anesthesia Quick Evaluation

## 2020-07-17 NOTE — Anesthesia Postprocedure Evaluation (Signed)
Anesthesia Post Note  Patient: Evalina Field  Procedure(s) Performed: RIGHT BREAST LUMPECTOMY WITH RADIOACTIVE SEED LOCALIZATION (Right Breast)     Patient location during evaluation: PACU Anesthesia Type: General Level of consciousness: awake and alert Pain management: pain level controlled Vital Signs Assessment: post-procedure vital signs reviewed and stable Respiratory status: spontaneous breathing, nonlabored ventilation, respiratory function stable and patient connected to nasal cannula oxygen Cardiovascular status: blood pressure returned to baseline and stable Postop Assessment: no apparent nausea or vomiting Anesthetic complications: no   No complications documented.  Last Vitals:  Vitals:   07/17/20 0900 07/17/20 0920  BP: (!) 171/90 136/81  Pulse: 69 68  Resp: (!) 8 12  Temp:  36.4 C  SpO2: 99% 95%    Last Pain:  Vitals:   07/17/20 0920  TempSrc:   PainSc: 0-No pain                 Catalina Gravel

## 2020-07-18 ENCOUNTER — Encounter (HOSPITAL_BASED_OUTPATIENT_CLINIC_OR_DEPARTMENT_OTHER): Payer: Self-pay | Admitting: Surgery

## 2020-07-22 LAB — SURGICAL PATHOLOGY

## 2020-09-05 ENCOUNTER — Other Ambulatory Visit: Payer: Self-pay | Admitting: Surgery

## 2020-09-05 DIAGNOSIS — Z1239 Encounter for other screening for malignant neoplasm of breast: Secondary | ICD-10-CM

## 2020-09-18 ENCOUNTER — Other Ambulatory Visit: Payer: Medicare Other

## 2020-09-23 ENCOUNTER — Other Ambulatory Visit: Payer: Self-pay

## 2020-09-23 ENCOUNTER — Ambulatory Visit
Admission: RE | Admit: 2020-09-23 | Discharge: 2020-09-23 | Disposition: A | Discharge status: Home / Self Care | Payer: Medicare Other | Source: Ambulatory Visit | Attending: Surgery | Admitting: Surgery

## 2020-09-23 DIAGNOSIS — Z1239 Encounter for other screening for malignant neoplasm of breast: Secondary | ICD-10-CM

## 2020-09-23 MED ORDER — GADOBUTROL 1 MMOL/ML IV SOLN
8.0000 mL | Freq: Once | INTRAVENOUS | Status: AC | PRN
  Administered 2020-09-23: 8 mL via INTRAVENOUS

## 2020-11-11 ENCOUNTER — Encounter (HOSPITAL_BASED_OUTPATIENT_CLINIC_OR_DEPARTMENT_OTHER): Payer: Self-pay

## 2020-11-11 ENCOUNTER — Ambulatory Visit (HOSPITAL_BASED_OUTPATIENT_CLINIC_OR_DEPARTMENT_OTHER): Admit: 2020-11-11 | Payer: Medicare Other | Admitting: Plastic Surgery

## 2020-11-11 SURGERY — REMOVAL, IMPLANT, BREAST
Anesthesia: General | Site: Breast | Laterality: Left

## 2021-01-05 LAB — COLOGUARD: COLOGUARD: NEGATIVE

## 2021-10-22 ENCOUNTER — Encounter (HOSPITAL_COMMUNITY): Payer: Self-pay

## 2021-12-02 ENCOUNTER — Other Ambulatory Visit: Payer: Self-pay | Admitting: Family

## 2021-12-02 DIAGNOSIS — Z1231 Encounter for screening mammogram for malignant neoplasm of breast: Secondary | ICD-10-CM

## 2021-12-18 ENCOUNTER — Ambulatory Visit
Admission: RE | Admit: 2021-12-18 | Discharge: 2021-12-18 | Disposition: A | Discharge status: Home / Self Care | Payer: Medicare HMO | Source: Ambulatory Visit | Attending: Family | Admitting: Family

## 2021-12-18 DIAGNOSIS — Z1231 Encounter for screening mammogram for malignant neoplasm of breast: Secondary | ICD-10-CM

## 2022-08-18 ENCOUNTER — Other Ambulatory Visit: Payer: Self-pay | Admitting: Family Medicine

## 2022-08-18 DIAGNOSIS — Z13820 Encounter for screening for osteoporosis: Secondary | ICD-10-CM

## 2022-08-18 DIAGNOSIS — Z1231 Encounter for screening mammogram for malignant neoplasm of breast: Secondary | ICD-10-CM

## 2022-12-27 ENCOUNTER — Ambulatory Visit: Payer: Medicare HMO

## 2022-12-28 ENCOUNTER — Ambulatory Visit
Admission: RE | Admit: 2022-12-28 | Discharge: 2022-12-28 | Disposition: A | Discharge status: Home / Self Care | Payer: Medicare HMO | Source: Ambulatory Visit | Attending: Family Medicine

## 2022-12-28 DIAGNOSIS — Z1231 Encounter for screening mammogram for malignant neoplasm of breast: Secondary | ICD-10-CM

## 2023-03-07 ENCOUNTER — Inpatient Hospital Stay: Admission: RE | Admit: 2023-03-07 | Payer: Medicare HMO | Source: Ambulatory Visit

## 2023-04-04 ENCOUNTER — Ambulatory Visit
Admission: RE | Admit: 2023-04-04 | Discharge: 2023-04-04 | Disposition: A | Discharge status: Home / Self Care | Payer: Medicare HMO | Source: Ambulatory Visit | Attending: Family Medicine

## 2023-04-04 DIAGNOSIS — Z13820 Encounter for screening for osteoporosis: Secondary | ICD-10-CM

## 2023-11-28 ENCOUNTER — Other Ambulatory Visit: Payer: Self-pay | Admitting: Family Medicine

## 2023-11-28 DIAGNOSIS — Z1231 Encounter for screening mammogram for malignant neoplasm of breast: Secondary | ICD-10-CM

## 2023-12-29 ENCOUNTER — Ambulatory Visit
Admission: RE | Admit: 2023-12-29 | Discharge: 2023-12-29 | Disposition: A | Discharge status: Home / Self Care | Source: Ambulatory Visit | Attending: Family Medicine | Admitting: Family Medicine

## 2023-12-29 DIAGNOSIS — Z1231 Encounter for screening mammogram for malignant neoplasm of breast: Secondary | ICD-10-CM

## 2024-01-03 ENCOUNTER — Other Ambulatory Visit: Payer: Self-pay | Admitting: Family Medicine

## 2024-01-03 DIAGNOSIS — R928 Other abnormal and inconclusive findings on diagnostic imaging of breast: Secondary | ICD-10-CM

## 2024-01-10 ENCOUNTER — Ambulatory Visit
Admission: RE | Admit: 2024-01-10 | Discharge: 2024-01-10 | Disposition: A | Discharge status: Home / Self Care | Source: Ambulatory Visit | Attending: Family Medicine | Admitting: Family Medicine

## 2024-01-10 ENCOUNTER — Other Ambulatory Visit: Payer: Self-pay | Admitting: Family Medicine

## 2024-01-10 DIAGNOSIS — R928 Other abnormal and inconclusive findings on diagnostic imaging of breast: Secondary | ICD-10-CM

## 2024-01-10 DIAGNOSIS — N6312 Unspecified lump in the right breast, upper inner quadrant: Secondary | ICD-10-CM

## 2024-01-11 ENCOUNTER — Ambulatory Visit
Admission: RE | Admit: 2024-01-11 | Discharge: 2024-01-11 | Disposition: A | Discharge status: Home / Self Care | Source: Ambulatory Visit | Attending: Family Medicine

## 2024-01-11 ENCOUNTER — Ambulatory Visit
Admission: RE | Admit: 2024-01-11 | Discharge: 2024-01-11 | Disposition: A | Discharge status: Home / Self Care | Source: Ambulatory Visit | Attending: Family Medicine | Admitting: Family Medicine

## 2024-01-11 DIAGNOSIS — N6312 Unspecified lump in the right breast, upper inner quadrant: Secondary | ICD-10-CM

## 2024-01-11 HISTORY — PX: BREAST BIOPSY: SHX20

## 2024-01-13 LAB — SURGICAL PATHOLOGY

## 2024-01-19 ENCOUNTER — Ambulatory Visit: Payer: Self-pay | Admitting: Surgery

## 2024-01-19 DIAGNOSIS — C50911 Malignant neoplasm of unspecified site of right female breast: Secondary | ICD-10-CM

## 2024-01-20 ENCOUNTER — Other Ambulatory Visit: Payer: Self-pay | Admitting: Surgery

## 2024-01-20 DIAGNOSIS — C50911 Malignant neoplasm of unspecified site of right female breast: Secondary | ICD-10-CM

## 2024-01-22 ENCOUNTER — Ambulatory Visit
Admission: RE | Admit: 2024-01-22 | Discharge: 2024-01-22 | Disposition: A | Discharge status: Home / Self Care | Source: Ambulatory Visit | Attending: Surgery | Admitting: Surgery

## 2024-01-22 DIAGNOSIS — C50911 Malignant neoplasm of unspecified site of right female breast: Secondary | ICD-10-CM

## 2024-01-22 MED ORDER — GADOPICLENOL 0.5 MMOL/ML IV SOLN
8.0000 mL | Freq: Once | INTRAVENOUS | Status: AC | PRN
  Administered 2024-01-22: 8 mL via INTRAVENOUS

## 2024-01-23 ENCOUNTER — Other Ambulatory Visit: Payer: Self-pay | Admitting: Surgery

## 2024-01-23 DIAGNOSIS — C50911 Malignant neoplasm of unspecified site of right female breast: Secondary | ICD-10-CM

## 2024-01-25 ENCOUNTER — Other Ambulatory Visit: Payer: Self-pay | Admitting: *Deleted

## 2024-01-25 DIAGNOSIS — C50211 Malignant neoplasm of upper-inner quadrant of right female breast: Secondary | ICD-10-CM | POA: Insufficient documentation

## 2024-01-26 ENCOUNTER — Telehealth: Payer: Self-pay | Admitting: Hematology and Oncology

## 2024-01-26 NOTE — Telephone Encounter (Signed)
 Scheduled appointments per referral. Talked with the patient and she is aware of the appointment time and date as well as the address. Patient was informed to arrive 10-15 minutes prior with updated insurance information. Patient was also informed that two guests are permitted but needs to be at least 74 years of age. All questions were answered.

## 2024-01-31 ENCOUNTER — Other Ambulatory Visit: Payer: Self-pay | Admitting: Surgery

## 2024-01-31 DIAGNOSIS — R928 Other abnormal and inconclusive findings on diagnostic imaging of breast: Secondary | ICD-10-CM

## 2024-01-31 NOTE — Progress Notes (Signed)
 Radiation Oncology         [phone]) [id number] ________________________________  Name: Sandy Morales        MRN: [id number]  Date of Service: 02/07/2024 DOB: Sep 04, 1950  CC:Shelda Atlas, MD  Vanderbilt Ned, MD     REFERRING PHYSICIAN: Vanderbilt Ned, MD   DIAGNOSIS: The encounter diagnosis was Malignant neoplasm of upper-inner quadrant of right breast in female, estrogen receptor positive (HCC).   HISTORY OF PRESENT ILLNESS: Sandy Morales is a 73 y.o. femaleseen at the request of Dr. Vanderbilt for a new diagnosis of  right breast cancer.  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  She also had a breast tissue expander placed at the time.  It appears that she had an incision and drainage of a hematoma, and in 2022, the patient was diagnosed with a papilloma in the right breast.  She was counseled on surgical excision and underwent a right lumpectomy with Dr. Vanderbilt on 07/17/2020 and at the time no malignancy was identified but a papillary lesion with atypical type ductal hyperplasia was noted.  She recently had a screening mammogram on 12/29/2023 that showed a possible asymmetry/architecture in the right breast and diagnostic workup showed persistence of this on spot compression views in the mid to posterior third of the retroareolar breast.  By ultrasound a mass with spiculated margins was confirmed in the 2 o'clock position measuring 8 mm in greatest dimension with an adjacent oval hypoechoic mass measuring 7 mm in greatest dimension felt to either be a complicated or simple cyst.  An incidental cyst was also seen in the 3 o'clock position measuring 7 mm in greatest dimension.  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  She was counseled on MRI  for extent of disease which was performed on 01/22/2024 and showed signal artifact in the upper inner quadrant of the right breast but no surrounding abnormal enhancement around that area was appreciated, and 2 areas of non-mass like enhancement measuring 8 and 5 mm in the upper inner quadrant were noted to be indeterminant and it was recommended that she have MRI core biopsy.  No abnormal appearing lymph nodes were present.  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  She is scheduled for stereo biopsy on 02/10/2024 of calcifications.  She is contemplating surgical options but scheduled for lumpectomy.  She is seen today to discuss adjuvant radiotherapy if she undergoes breast conservation surgery for the right breast cancer.    PREVIOUS RADIATION THERAPY: No   PAST MEDICAL HISTORY:  Past Medical History:  Diagnosis Date   Breast cancer (HCC) 2014    left mastectomy   Dyslipidemia    GERD (gastroesophageal reflux disease)    Gout    Hypertension    Osteoarthritis    Papilloma of breast    right       PAST SURGICAL HISTORY: Past Surgical History:  Procedure Laterality Date   ABDOMINAL HYSTERECTOMY     BREAST BIOPSY Right 01/11/2024   US  RT BREAST BX W LOC DEV 1ST LESION IMG BX SPEC US  GUIDE 01/11/2024 [facility] MAMMO[facility]AP[facility]   BREAST EXCISIONAL BIOPSY Right 07/17/2020   FLAT EPITHELIAL ATYPIA WITH CALCIFICATIONS,  BREAST LUMPECTOMY WITH RADIOACTIVE SEED LOCALIZATION Right 07/17/2020   Procedure: RIGHT BREAST LUMPECTOMY WITH RADIOACTIVE SEED LOCALIZATION;  Surgeon: Vanderbilt Ned, MD;  Location: [facility];  Service: General;  Laterality: Right;   CYST EXCISION     rt middle finger   INCISION AND DRAINAGE OF WOUND Left 12/16/2012   Procedure: INCISION AND DRAINAGE Hematoma IN CHEST with drain placement;  Surgeon: Alm Sick, MD;  Location: [facility] OR;  Service:  Plastics;  Laterality: Left;   KNEE SURGERY     MASTECTOMY Left 04/27/2012   left breast  with tissue expander   MASTECTOMY W/ SENTINEL NODE BIOPSY  04/27/2012   Procedure: MASTECTOMY WITH SENTINEL LYMPH NODE BIOPSY;  Surgeon: Ned LABOR. Cornett, MD;  Location: [facility] OR;  Service: General;  Laterality: Left;  LEFT SIMPLE MASTECTOMY; SENTINEL LYMPH NODE BIOPSY   TISSUE EXPANDER PLACEMENT  04/27/2012   Procedure: TISSUE EXPANDER;  Surgeon: Alm Sick, MD;  Location: [facility] OR;  Service: Plastics;  Laterality: Left;  PLACEMENT OF LEFT BREAST TISSUE EXPANDER     FAMILY HISTORY:  Family History  Problem Relation Age of Onset   Cancer Mother        Unknown type of cancer   Lung cancer Father      SOCIAL HISTORY:  reports that she has never smoked. She has never used smokeless tobacco. She reports current drug use. Drug: Marijuana. She reports that she does not drink alcohol. The patient is single and lives in [place]. She helps care for her grandchildren and great grandchildren. She enjoys cooking for her family and spending time at her church.    ALLERGIES: Patient has no known allergies.   MEDICATIONS:  Current Outpatient Medications  Medication Sig Dispense Refill   allopurinol  (ZYLOPRIM ) 100 MG tablet Take 100 mg by mouth daily.     benazepril -hydrochlorthiazide (LOTENSIN  HCT) 10-12.5 MG tablet Take 1 tablet by mouth daily.     Calcium Carbonate (CALCIUM 600 PO) Take 600 mg by mouth daily.     Cholecalciferol (VITAMIN D -3) 1000 UNITS CAPS Take 1 capsule by mouth daily.     colchicine  0.6 MG tablet Take 0.6 mg by mouth 2 (two) times daily.      diphenhydrAMINE  (BENADRYL ) 25 mg capsule Take 25 mg by mouth every 6 (six) hours as needed for itching.     docusate sodium  100 MG CAPS Take 100 mg by mouth daily. (Patient taking differently: Take 100 mg by mouth daily as needed (constipation).) 10 capsule 0   famotidine  (PEPCID ) 20 MG tablet Take 20 mg by mouth 2 (two) times daily.      HYDROcodone -acetaminophen  (NORCO/VICODIN) 5-325 MG tablet Take 1 tablet by mouth every 6 (six) hours as needed for moderate pain. 15 tablet 0   methocarbamol  (ROBAXIN ) 500 MG tablet Take 500 mg by mouth daily.     potassium chloride  (K-DUR) 10 MEQ tablet Take 2 tablets (20 mEq total) by mouth daily. 6 tablet 0   pravastatin (PRAVACHOL) 80 MG tablet Take 80 mg by mouth daily.     vitamin E 100 UNIT capsule Take 100 Units by mouth daily.     No current facility-administered medications for this visit.     REVIEW OF SYSTEMS: On review of systems, the patient reports that she is doing well overall and would not know she has cancer if she hadn't had additional imaging of the breast. No other complaints are verbalized.      PHYSICAL EXAM:  Wt Readings from Last 3 Encounters:  07/17/20 179 lb 3.7 oz (81.3 kg)  11/07/17 184 lb (83.5 kg)  03/24/17 187 lb 3.2 oz (84.9 kg)   Temp Readings from Last 3 Encounters:  07/17/20 97.6 F (36.4 C)  11/07/17 98.6 F (37 C) (Oral)  03/24/17 98.4 F (36.9 C) (Oral)   BP Readings from Last 3 Encounters:  07/17/20 136/81  11/07/17 129/79  03/24/17 120/82   Pulse Readings from Last 3 Encounters:  07/17/20 68  11/07/17 79  03/24/17 76    In general this is a well appearing African American female in no acute distress. She's alert and oriented x4 and appropriate throughout the examination. Cardiopulmonary assessment is negative for acute distress and she exhibits normal effort. Bilateral breast exam is deferred.    ECOG = 1  0 - Asymptomatic (Fully active, able to carry on all predisease activities without restriction)  1 - Symptomatic but completely ambulatory (Restricted in physically strenuous activity but ambulatory and able to carry out work of a light or sedentary nature. For example, light housework, office work)  2 - Symptomatic, <50% in bed during the day (Ambulatory and capable of all self care but unable to carry out any work  activities. Up and about more than 50% of waking hours)  3 - Symptomatic, >50% in bed, but not bedbound (Capable of only limited self-care, confined to bed or chair 50% or more of waking hours)  4 - Bedbound (Completely disabled. Cannot carry on any self-care. Totally confined to bed or chair)  5 - Death   Raylene MM, Creech RH, Tormey DC, et al. [phone]). Toxicity and response criteria of the [facility]. Am. DOROTHA Bridges. Oncol. 5 (6): 649-55    LABORATORY DATA:  Lab Results  Component Value Date   WBC 5.6 06/20/2020   HGB 12.8 06/20/2020   HCT 38.6 06/20/2020   MCV 98.7 06/20/2020   PLT 200 06/20/2020   Lab Results  Component Value Date   NA 139 07/14/2020   K 4.3 07/14/2020   CL 107 07/14/2020   CO2 23 07/14/2020   Lab Results  Component Value Date   ALT 14 06/20/2020   AST 19 06/20/2020   ALKPHOS 79 06/20/2020   BILITOT 1.0 06/20/2020      RADIOGRAPHY: MR BREAST BILATERAL W WO CONTRAST INC CAD Result Date: 01/23/2024 CLINICAL DATA:  73 year old female with recently diagnosed invasive lobular carcinoma in the 2 o'clock region of the right breast 6 cm from the nipple (ribbon clip). History of left breast cancer status post mastectomy in 2013. EXAM: BILATERAL BREAST MRI WITH AND WITHOUT CONTRAST TECHNIQUE: Multiplanar, multisequence MR images of both breasts were obtained prior to and following the intravenous administration of 8 ml of Vueway Three-dimensional MR images were rendered by post-processing of the original MR data on an independent workstation. The three-dimensional MR images were interpreted, and findings are reported in the following complete MRI report for this study. Three dimensional images were evaluated at the independent interpreting workstation using the DynaCAD thin client. COMPARISON:  With priors. FINDINGS: Breast composition: c. Heterogeneous fibroglandular tissue. Background parenchymal enhancement: Moderate. Right breast: There appears  to be a signal void artifact in the upper-inner quadrant of the right breast, middle to posterior depth (image 68 series 3) likely from the biopsy clip. There is no surrounding enhancement. In the upper inner quadrant of the right breast, anterior to middle depth there are 2 areas of non masslike enhancement measuring 8 mm and 5 mm (image 75 and 71  series 6). They are indeterminate. Left breast: No mass or abnormal enhancement. Status post left mastectomy with implant reconstruction. Lymph nodes: No abnormal appearing lymph nodes. Ancillary findings:  None. IMPRESSION: 1. Probable signal void artifact in the upper-inner quadrant of the right breast, middle to posterior depth from the biopsy marker clip. There is no surrounding abnormal enhancement. 2. Two areas of non masslike enhancement measuring 8 and 5 mm in the upper-inner quadrant of the right breast anterior to middle depth that are indeterminate. MR guided core biopsy is recommended. RECOMMENDATION: MR guided core biopsies of the 2 areas of non masslike enhancement in the upper-inner quadrant of the right breast is recommended. BI-RADS CATEGORY  4: Suspicious. Electronically Signed   By: Dina  Arceo M.D.   On: 01/23/2024 10:46   US  RT BREAST BX W LOC DEV 1ST LESION IMG BX SPEC US  GUIDE Addendum Date: 01/17/2024 ADDENDUM REPORT: 01/17/2024 07:56 ADDENDUM: PATHOLOGY revealed: 1. Breast, right, needle core biopsy, 2:00 6 cmfn (ribbon clip) : INVASIVE LOBULAR CARCINOMA, GRADE 1 - LOBULAR CARCINOMA IN SITU (LCIS) - OVERALL GRADE: GRADE 2. - ANGIOLYMPHATIC INVASION NOT IDENTIFIED. - NEGATIVE FOR MICROCALCIFICATIONS. TUMOR MEASURES 12 MM IN GREATEST LINEAR EXTENT - FOCAL CYSTIC PAPILLARY APOCRINE METAPLASIA. Pathology results are CONCORDANT with imaging findings, per Dr. Curtistine Noble. Pathology results and recommendations were discussed with patient via telephone on 01/12/24 by Rock Hover RN. Patient reported biopsy site doing well with no adverse symptoms,  and only slight tenderness at the site. Post biopsy care instructions were reviewed, questions were answered and my direct phone number was provided. Patient was instructed to call [facility] for any additional questions or concerns related to biopsy site. RECOMMENDATION: Surgical and oncological consultation. Request for surgical consultation with Dr. Debby Shipper was relayed to Landry Finger at [facility] on 01/13/2024 by Rock Hover RN. Pathology results reported by Rock Hover RN on 01/12/2024. Electronically Signed   By: Curtistine Noble   On: 01/17/2024 07:56   Result Date: 01/17/2024 CLINICAL DATA:  73 year old female here for biopsy of a possible right breast mass. EXAM: ULTRASOUND GUIDED RIGHT BREAST CORE NEEDLE BIOPSY COMPARISON:  Previous exam(s). PROCEDURE: I met with the patient and we discussed the procedure of ultrasound-guided biopsy, including benefits and alternatives. We discussed the high likelihood of a successful procedure. We discussed the risks of the procedure, including infection, bleeding, tissue injury, clip migration, and inadequate sampling. Informed written consent was given. The usual time-out protocol was performed immediately prior to the procedure. Lesion quadrant: Upper inner quadrant Using sterile technique and 1% Lidocaine  as local anesthetic, under direct ultrasound visualization, a 14 gauge spring-loaded device was used to perform biopsy of the irregular mass using a lateral approach. At the conclusion of the procedure a ribbon shaped tissue marker clip was deployed into the biopsy cavity. Follow up 2 view mammogram was performed and dictated separately. IMPRESSION: Ultrasound guided biopsy of the right breast as above. No apparent complications. Electronically Signed: By: Curtistine Noble On: 01/11/2024 12:01   MM CLIP PLACEMENT RIGHT Result Date: 01/11/2024 CLINICAL DATA:  73 year old female status post right breast biopsy EXAM: 3D  DIAGNOSTIC RIGHT MAMMOGRAM POST ULTRASOUND BIOPSY COMPARISON:  Previous exam(s). ACR Breast Density Category c: The breasts are heterogeneously dense, which may obscure small masses. FINDINGS: 3D Mammographic images were obtained following ultrasound guided biopsy of a right breast mass. The biopsy marking clip is in expected position at the site of biopsy. IMPRESSION: Appropriate positioning of the ribbon  shaped biopsy marking clip at the site of biopsy in the upper inner right breast. Final Assessment: Post Procedure Mammograms for Marker Placement Electronically Signed   By: Curtistine Noble   On: 01/11/2024 12:20   MM 3D DIAGNOSTIC MAMMOGRAM UNILATERAL RIGHT BREAST Result Date: 01/10/2024 CLINICAL DATA:  73 year old female recalled for a possible MLO asymmetry/architectural distortion in the right breast. EXAM: DIGITAL DIAGNOSTIC UNILATERAL RIGHT MAMMOGRAM WITH TOMOSYNTHESIS AND CAD; ULTRASOUND RIGHT BREAST LIMITED TECHNIQUE: Right digital diagnostic mammography and breast tomosynthesis was performed. Additional 2 and 3D spot compression views were obtained. The images were evaluated with computer-aided detection. ; Targeted ultrasound examination of the right breast was performed COMPARISON:  Previous exam(s). ACR Breast Density Category c: The breasts are heterogeneously dense, which may obscure small masses. FINDINGS: Right Mammogram: Spot-compression views demonstrate a possible equal density spiculated mass measuring up to 13 mm about the retroareolar breast, middle to posterior third. Right breast ultrasound: Targeted imaging at about the 2 o'clock position, approximately 6 centimeters from the nipple, demonstrates an irregular hypoechoic mass with spiculated margins measuring approximately 8 x 7 x 5 mm. Adjacent to this spiculated mass is an oval hypoechoic mass measuring approximately 5 x7 x 3 mm. This mass may represent a complicated versus simple cyst. The spiculated mass correlates favorably with  the mammographic findings described above. An incidental cyst was identified about the 3 o'clock position, approximately 6 cm from the nipple measuring approximately 7 x 4 x 6 mm. The right axilla was unremarkable. IMPRESSION: Suspicious right breast mass as above. RECOMMENDATION: Right breast ultrasound-guided biopsy. I have discussed the findings and recommendations with the patient. If applicable, a reminder letter will be sent to the patient regarding the next appointment. BI-RADS CATEGORY  4: Suspicious. Electronically Signed   By: Curtistine Noble   On: 01/10/2024 13:43   US  LIMITED ULTRASOUND INCLUDING AXILLA RIGHT BREAST Result Date: 01/10/2024 CLINICAL DATA:  73 year old female recalled for a possible MLO asymmetry/architectural distortion in the right breast. EXAM: DIGITAL DIAGNOSTIC UNILATERAL RIGHT MAMMOGRAM WITH TOMOSYNTHESIS AND CAD; ULTRASOUND RIGHT BREAST LIMITED TECHNIQUE: Right digital diagnostic mammography and breast tomosynthesis was performed. Additional 2 and 3D spot compression views were obtained. The images were evaluated with computer-aided detection. ; Targeted ultrasound examination of the right breast was performed COMPARISON:  Previous exam(s). ACR Breast Density Category c: The breasts are heterogeneously dense, which may obscure small masses. FINDINGS: Right Mammogram: Spot-compression views demonstrate a possible equal density spiculated mass measuring up to 13 mm about the retroareolar breast, middle to posterior third. Right breast ultrasound: Targeted imaging at about the 2 o'clock position, approximately 6 centimeters from the nipple, demonstrates an irregular hypoechoic mass with spiculated margins measuring approximately 8 x 7 x 5 mm. Adjacent to this spiculated mass is an oval hypoechoic mass measuring approximately 5 x7 x 3 mm. This mass may represent a complicated versus simple cyst. The spiculated mass correlates favorably with the mammographic findings described above.  An incidental cyst was identified about the 3 o'clock position, approximately 6 cm from the nipple measuring approximately 7 x 4 x 6 mm. The right axilla was unremarkable. IMPRESSION: Suspicious right breast mass as above. RECOMMENDATION: Right breast ultrasound-guided biopsy. I have discussed the findings and recommendations with the patient. If applicable, a reminder letter will be sent to the patient regarding the next appointment. BI-RADS CATEGORY  4: Suspicious. Electronically Signed   By: Curtistine Noble   On: 01/10/2024 13:43       IMPRESSION/PLAN: 1. Stage IA,  [id number], grade 1, ER/PR positive invasive lobular carcinoma with associated LCIS of the right breast. Dr. Dewey discusses the pathology findings and reviews the nature of early stage breast disease. The consensus from the breast conference includes breast conservation with lumpectomy. She meets with Dr. Loretha in a few weeks to discuss additional medication recommendations.  Depending on the size of the final tumor measurements rendered by pathology, the tumor may be tested for Oncotype Dx score to determine a role for systemic therapy. Dr. Loretha anticipates adjuvant antiestrogen therapy to follow. Dr. Dewey reviews the rationale for adjuvant radiotherapy to the breast to reduce risks of long term local recurrence. Dr. Dewey also discusses cases in which radiation may be optional for favorable cases based on final pathology.  We will follow-up with her final results of surgery, but if recommended Dr. Dewey would anticipate a course of 4 weeks of radiotherapy versus ultrahypofractionated radiation once weekly for 5 fractions. We will see her back a few weeks after surgery to discuss the decision making from her final pathology. She is also aware of the simulation process and if she proceeds, that we would anticipate  starting radiotherapy about 4-6 weeks after surgery.  2. History of left breast DCIS. She will be followed expectantly while  proceeding with treatment for #1.      In a visit lasting 60 minutes, greater than 50% of the time was spent face to face reviewing her case, as well as in preparation of, discussing, and coordinating the patient's care.  The above documentation reflects my direct findings during this shared patient visit. Please see the separate note by Dr. Dewey on this date for the remainder of the patient's plan of care.    Donald KYM Husband, [facility]    **Disclaimer: This note was dictated with voice recognition software. Similar sounding words can inadvertently be transcribed and this note may contain transcription errors which may not have been corrected upon publication of note.**

## 2024-02-02 ENCOUNTER — Ambulatory Visit
Admission: RE | Admit: 2024-02-02 | Discharge: 2024-02-02 | Disposition: A | Discharge status: Home / Self Care | Source: Ambulatory Visit | Attending: Surgery | Admitting: Surgery

## 2024-02-02 DIAGNOSIS — R928 Other abnormal and inconclusive findings on diagnostic imaging of breast: Secondary | ICD-10-CM

## 2024-02-02 MED ORDER — GADOPICLENOL 0.5 MMOL/ML IV SOLN: 8.0000 mL | Freq: Once | INTRAVENOUS | Status: DC | PRN

## 2024-02-03 ENCOUNTER — Telehealth: Payer: Self-pay

## 2024-02-03 LAB — SURGICAL PATHOLOGY

## 2024-02-03 NOTE — Telephone Encounter (Signed)
 Per MD called pt to confirm appt on 10/20. Appt. Was confirmed

## 2024-02-06 ENCOUNTER — Inpatient Hospital Stay: Attending: Hematology and Oncology | Admitting: Hematology and Oncology

## 2024-02-06 ENCOUNTER — Inpatient Hospital Stay

## 2024-02-06 ENCOUNTER — Ambulatory Visit: Payer: Self-pay | Admitting: Surgery

## 2024-02-06 VITALS — BP 143/67 | HR 53 | Temp 97.3°F | Resp 16 | Wt 180.1 lb

## 2024-02-06 DIAGNOSIS — Z1721 Progesterone receptor positive status: Secondary | ICD-10-CM | POA: Insufficient documentation

## 2024-02-06 DIAGNOSIS — M810 Age-related osteoporosis without current pathological fracture: Secondary | ICD-10-CM | POA: Diagnosis not present

## 2024-02-06 DIAGNOSIS — L700 Acne vulgaris: Secondary | ICD-10-CM | POA: Insufficient documentation

## 2024-02-06 DIAGNOSIS — Z9012 Acquired absence of left breast and nipple: Secondary | ICD-10-CM | POA: Insufficient documentation

## 2024-02-06 DIAGNOSIS — C50211 Malignant neoplasm of upper-inner quadrant of right female breast: Secondary | ICD-10-CM | POA: Diagnosis present

## 2024-02-06 DIAGNOSIS — Z79899 Other long term (current) drug therapy: Secondary | ICD-10-CM | POA: Diagnosis not present

## 2024-02-06 DIAGNOSIS — Z801 Family history of malignant neoplasm of trachea, bronchus and lung: Secondary | ICD-10-CM | POA: Diagnosis not present

## 2024-02-06 DIAGNOSIS — Z90710 Acquired absence of both cervix and uterus: Secondary | ICD-10-CM | POA: Insufficient documentation

## 2024-02-06 DIAGNOSIS — I10 Essential (primary) hypertension: Secondary | ICD-10-CM | POA: Diagnosis not present

## 2024-02-06 DIAGNOSIS — Z79811 Long term (current) use of aromatase inhibitors: Secondary | ICD-10-CM | POA: Insufficient documentation

## 2024-02-06 DIAGNOSIS — Z808 Family history of malignant neoplasm of other organs or systems: Secondary | ICD-10-CM | POA: Diagnosis not present

## 2024-02-06 DIAGNOSIS — Z170 Estrogen receptor positive status [ER+]: Secondary | ICD-10-CM | POA: Insufficient documentation

## 2024-02-06 DIAGNOSIS — F1290 Cannabis use, unspecified, uncomplicated: Secondary | ICD-10-CM | POA: Insufficient documentation

## 2024-02-06 DIAGNOSIS — Z86018 Personal history of other benign neoplasm: Secondary | ICD-10-CM | POA: Diagnosis not present

## 2024-02-06 DIAGNOSIS — Z17411 Hormone receptor positive with human epidermal growth factor receptor 2 negative status: Secondary | ICD-10-CM | POA: Diagnosis not present

## 2024-02-06 NOTE — Progress Notes (Signed)
 New Breast Cancer Diagnosis: Right Breast  Patient presented for screening mammogram on 12/29/2023 which shoed a possible asymmetry in the right breast.  Diagnostic workup and ultrasound were performed.   Histology per Pathology Report: grade 1, Invasive Lobular Carcinoma with associated LCIS.  01/11/2024                                Receptor Status: ER(positive), PR (positive), Her2-neu (negative), Ki-(20%)   Surgeon and surgical plan, if any:   Medical oncologist, treatment if any:   Dr. Loretha 02/06/2024 -  Family History of Breast/Ovarian/Prostate Cancer: None  Lymphedema issues, if any: No     Pain issues, if any: No    SAFETY ISSUES: Prior radiation? No Pacemaker/ICD? No Possible current pregnancy? Hysterectomy Is the patient on methotrexate? No  Current Complaints / other details:

## 2024-02-06 NOTE — Progress Notes (Signed)
 [facility] CONSULT NOTE  Patient Care Team: Shelda Atlas, MD as PCP - General (Internal Medicine)  CHIEF COMPLAINTS/PURPOSE OF CONSULTATION:  Newly diagnosed breast cancer  HISTORY OF PRESENTING ILLNESS:  Cataleya Cristina 72 y.o. female is here because of recent diagnosis of right ILC  I reviewed her records extensively and collaborated the history with the patient.  SUMMARY OF ONCOLOGIC HISTORY: Oncology History  Malignant neoplasm of upper-inner quadrant of right breast in female, estrogen receptor positive (HCC)  01/25/2024 Initial Diagnosis   Malignant neoplasm of upper-inner quadrant of right breast in female, estrogen receptor positive (HCC)   01/31/2024 Cancer Staging   Staging form: Breast, AJCC 8th Edition - Clinical stage from 01/31/2024: Stage IA (cT1b, cN0, cM0, G1, ER+, PR+, HER2-) - Signed by Lanell Donald Stagger, PA-C on 01/31/2024 Stage prefix: Initial diagnosis Method of lymph node assessment: Clinical Histologic grading system: 3 grade system     Discussed the use of AI scribe software for clinical note transcription with the patient, who gave verbal consent to proceed.  History of Present Illness Saphire Barnhart is a 72 year old female with a history of breast cancer who presents for evaluation of a new breast cancer diagnosis in the right breast.  She has a history of left breast cancer, having undergone a left mastectomy in 2014 for a more aggressive cancer that involved the lymph nodes. She was treated with anastrozole  for five years, which she tolerated well without side effects.  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 The cancer is estrogen and progesterone receptor positive, her 2 neg. A subsequent biopsy in October 2025 showed a non-cancerous intraductal papilloma.  She is scheduled for surgery on February 14, 2024, She has an  appointment with the radiation team scheduled for February 07, 2024.  Her past medical history includes osteoporosis, diagnosed in 2014, for which she has not been taking specific osteoporosis medication like Fosamax or Boniva. She currently takes a muscle relaxant, Robaxin . She takes medication for high blood pressure.  There is no family history of breast cancer, but her mother had vaginal and brain cancer. She has not undergone genetic testing for BRCA genes in the past.  Socially, she is retired and lives alone in [place]. She has three children, with two living nearby, and grandchildren who assist her occasionally. She does not use computers or smartphones and is not familiar with digital health platforms like MyChart.  No lumps or changes in the right breast prior to biopsy. No reported side effects from previous anastrozole  treatment. No known diabetes.    MEDICAL HISTORY:  Past Medical History:  Diagnosis Date   Breast cancer (HCC) 2014    left mastectomy   Dyslipidemia    GERD (gastroesophageal reflux disease)    Gout    Hypertension    Osteoarthritis    Papilloma of breast    right    SURGICAL HISTORY: Past Surgical History:  Procedure Laterality Date   ABDOMINAL HYSTERECTOMY     BREAST BIOPSY Right 01/11/2024   US  RT BREAST BX W LOC DEV 1ST LESION IMG BX SPEC US  GUIDE 01/11/2024 [facility] MAMMOGRAPHY   BREAST EXCISIONAL BIOPSY Right 07/17/2020   FLAT EPITHELIAL ATYPIA WITH CALCIFICATIONS,   BREAST LUMPECTOMY WITH RADIOACTIVE SEED LOCALIZATION Right 07/17/2020   Procedure: RIGHT BREAST LUMPECTOMY WITH RADIOACTIVE SEED LOCALIZATION;  Surgeon: Vanderbilt Ned, MD;  Location: [facility];  Service: General;  Laterality: Right;   CYST EXCISION     rt middle finger   INCISION AND DRAINAGE OF WOUND Left 12/16/2012   Procedure: INCISION AND DRAINAGE Hematoma IN CHEST with drain placement;  Surgeon: Alm Sick, MD;  Location: [facility] OR;  Service: Plastics;   Laterality: Left;   KNEE SURGERY     MASTECTOMY Left 04/27/2012   left breast  with tissue expander   MASTECTOMY W/ SENTINEL NODE BIOPSY  04/27/2012   Procedure: MASTECTOMY WITH SENTINEL LYMPH NODE BIOPSY;  Surgeon: Debby LABOR. Cornett, MD;  Location: [facility] OR;  Service: General;  Laterality: Left;  LEFT SIMPLE MASTECTOMY; SENTINEL LYMPH NODE BIOPSY   TISSUE EXPANDER PLACEMENT  04/27/2012   Procedure: TISSUE EXPANDER;  Surgeon: Alm Sick, MD;  Location: [facility] OR;  Service: Plastics;  Laterality: Left;  PLACEMENT OF LEFT BREAST TISSUE EXPANDER    SOCIAL HISTORY: Social History   Socioeconomic History   Marital status: Single    Spouse name: Not on file   Number of children: Not on file   Years of education: Not on file   Highest education level: Not on file  Occupational History   Not on file  Tobacco Use   Smoking status: Never   Smokeless tobacco: Never   Tobacco comments:    Uses marijuana    Vaping Use   Vaping status: Never Used  Substance and Sexual Activity   Alcohol use: No   Drug use: Yes    Types: Marijuana    Comment: last smoked 06-17-20   Sexual activity: Yes    Birth control/protection: Post-menopausal, Surgical    Comment: Hysterectomy  Other Topics Concern   Not on file  Social History Narrative   Not on file   Social Drivers of Health   Financial Resource Strain: Not on file  Food Insecurity: Not on file  Transportation Needs: Not on file  Physical Activity: Not on file  Stress: Not on file  Social Connections: Not on file  Intimate Partner Violence: Not on file    FAMILY HISTORY: Family History  Problem Relation Age of Onset   Cancer Mother        Unknown type of cancer   Lung cancer Father     ALLERGIES:  has no known allergies.  MEDICATIONS:  Current Outpatient Medications  Medication Sig Dispense Refill   allopurinol  (ZYLOPRIM ) 100 MG tablet Take 100 mg by mouth daily.     benazepril -hydrochlorthiazide (LOTENSIN  HCT) 10-12.5 MG tablet  Take 1 tablet by mouth daily.     Calcium Carbonate (CALCIUM 600 PO) Take 600 mg by mouth daily.     Cholecalciferol (VITAMIN D -3) 1000 UNITS CAPS Take 1 capsule by mouth daily.     colchicine  0.6 MG tablet Take 0.6 mg by mouth 2 (two) times daily.      diphenhydrAMINE  (BENADRYL ) 25 mg capsule Take 25 mg by mouth every 6 (six) hours as needed for itching.     docusate sodium  100 MG CAPS Take 100 mg by mouth daily. (Patient taking differently: Take 100 mg by mouth daily as needed (constipation).) 10 capsule 0   famotidine  (PEPCID ) 20 MG tablet Take 20 mg by mouth 2 (two) times daily.     HYDROcodone -acetaminophen  (NORCO/VICODIN) 5-325 MG tablet Take 1 tablet by mouth every 6 (six) hours as needed for moderate pain. 15 tablet 0   methocarbamol  (ROBAXIN ) 500 MG tablet Take 500 mg by mouth daily.     potassium chloride  (K-DUR) 10 MEQ tablet Take  2 tablets (20 mEq total) by mouth daily. 6 tablet 0   pravastatin (PRAVACHOL) 80 MG tablet Take 80 mg by mouth daily.     vitamin E 100 UNIT capsule Take 100 Units by mouth daily.     No current facility-administered medications for this visit.    REVIEW OF SYSTEMS:   Constitutional: Denies fevers, chills or abnormal night sweats Eyes: Denies blurriness of vision, double vision or watery eyes Ears, nose, mouth, throat, and face: Denies mucositis or sore throat Respiratory: Denies cough, dyspnea or wheezes Cardiovascular: Denies palpitation, chest discomfort or lower extremity swelling Gastrointestinal:  Denies nausea, heartburn or change in bowel habits Skin: Denies abnormal skin rashes Lymphatics: Denies new lymphadenopathy or easy bruising Neurological:Denies numbness, tingling or new weaknesses Behavioral/Psych: Mood is stable, no new changes  Breast:  Denies any palpable lumps or discharge All other systems were reviewed with the patient and are negative.  PHYSICAL EXAMINATION: ECOG PERFORMANCE STATUS: 0 - Asymptomatic  Vitals:   02/06/24  1010  BP: (!) 143/67  Pulse: (!) 53  Resp: 16  Temp: (!) 97.3 F (36.3 C)  SpO2: 99%   Filed Weights   02/06/24 1010  Weight: 180 lb 1.6 oz (81.7 kg)    GENERAL:alert, no distress and comfortable Right breast with post biopsy changes. No def palpable mass. No regional adenopathy Left breast s/p mastectomy and implant. No concern for local recurrence CTA bilaterally RRR No LE edema.  LABORATORY DATA:  I have reviewed the data as listed Lab Results  Component Value Date   WBC 5.6 06/20/2020   HGB 12.8 06/20/2020   HCT 38.6 06/20/2020   MCV 98.7 06/20/2020   PLT 200 06/20/2020   Lab Results  Component Value Date   NA 139 07/14/2020   K 4.3 07/14/2020   CL 107 07/14/2020   CO2 23 07/14/2020    RADIOGRAPHIC STUDIES: I have personally reviewed the radiological reports and agreed with the findings in the report.  ASSESSMENT AND PLAN:   Assessment and Plan Assessment & Plan Invasive lobular carcinoma of right breast, estrogen and progesterone receptor positive, her 2 neg Invasive lobular carcinoma, 8 mm, ER/PR positive, intermediate grade, hormone-driven. Previous left breast cancer treated with mastectomy and anastrozole . Surgery planned with potential radiation and antiestrogen therapy. Oncotype testing post-surgery if tumor =5 mm.   - Consider oncotype testing post-surgery if tumor is 5 mm or larger. - Discuss radiation therapy options with radiation oncology. - Initiate antiestrogen therapy with letrozole post-radiation. - Coordinate genetic counseling and testing for BRCA genes.  Osteoporosis Osteoporosis diagnosed in 2014. Currently not on osteoporosis-specific medication. Plan includes reassessment of bone density and potential initiation of osteoporosis medication post-cancer treatment. - Conduct bone density scan post-cancer treatment. - Refer to dentist for evaluation prior to osteoporosis medication. - Advise intake of calcium and vitamin D   supplements.   All questions were answered. The patient knows to call the clinic with any problems, questions or concerns.    Amber Stalls, MD 02/06/24

## 2024-02-07 ENCOUNTER — Inpatient Hospital Stay: Admitting: Genetic Counselor

## 2024-02-07 ENCOUNTER — Telehealth: Payer: Self-pay | Admitting: *Deleted

## 2024-02-07 ENCOUNTER — Ambulatory Visit
Admission: RE | Admit: 2024-02-07 | Discharge: 2024-02-07 | Disposition: A | Discharge status: Home / Self Care | Source: Ambulatory Visit | Attending: Radiation Oncology | Admitting: Radiation Oncology

## 2024-02-07 ENCOUNTER — Inpatient Hospital Stay

## 2024-02-07 ENCOUNTER — Other Ambulatory Visit: Payer: Self-pay

## 2024-02-07 ENCOUNTER — Encounter (HOSPITAL_BASED_OUTPATIENT_CLINIC_OR_DEPARTMENT_OTHER): Payer: Self-pay | Admitting: Surgery

## 2024-02-07 ENCOUNTER — Encounter: Payer: Self-pay | Admitting: *Deleted

## 2024-02-07 ENCOUNTER — Encounter: Payer: Self-pay | Admitting: Radiation Oncology

## 2024-02-07 DIAGNOSIS — Z801 Family history of malignant neoplasm of trachea, bronchus and lung: Secondary | ICD-10-CM | POA: Diagnosis not present

## 2024-02-07 DIAGNOSIS — Z86000 Personal history of in-situ neoplasm of breast: Secondary | ICD-10-CM | POA: Insufficient documentation

## 2024-02-07 DIAGNOSIS — C50211 Malignant neoplasm of upper-inner quadrant of right female breast: Secondary | ICD-10-CM | POA: Insufficient documentation

## 2024-02-07 DIAGNOSIS — I10 Essential (primary) hypertension: Secondary | ICD-10-CM | POA: Insufficient documentation

## 2024-02-07 DIAGNOSIS — Z9012 Acquired absence of left breast and nipple: Secondary | ICD-10-CM | POA: Diagnosis not present

## 2024-02-07 DIAGNOSIS — M1990 Unspecified osteoarthritis, unspecified site: Secondary | ICD-10-CM | POA: Diagnosis not present

## 2024-02-07 DIAGNOSIS — K219 Gastro-esophageal reflux disease without esophagitis: Secondary | ICD-10-CM | POA: Insufficient documentation

## 2024-02-07 DIAGNOSIS — E785 Hyperlipidemia, unspecified: Secondary | ICD-10-CM | POA: Diagnosis not present

## 2024-02-07 DIAGNOSIS — Z170 Estrogen receptor positive status [ER+]: Secondary | ICD-10-CM | POA: Diagnosis not present

## 2024-02-07 DIAGNOSIS — Z79899 Other long term (current) drug therapy: Secondary | ICD-10-CM | POA: Insufficient documentation

## 2024-02-07 NOTE — Telephone Encounter (Signed)
 Called patient to introduce self and the role of nurse navigation. No answer and voice mail box full. Will attempt again later in week.

## 2024-02-08 ENCOUNTER — Other Ambulatory Visit: Payer: Self-pay

## 2024-02-08 ENCOUNTER — Encounter (HOSPITAL_BASED_OUTPATIENT_CLINIC_OR_DEPARTMENT_OTHER)
Admission: RE | Admit: 2024-02-08 | Discharge: 2024-02-08 | Disposition: A | Discharge status: Home / Self Care | Source: Ambulatory Visit | Attending: Surgery | Admitting: Surgery

## 2024-02-08 DIAGNOSIS — I10 Essential (primary) hypertension: Secondary | ICD-10-CM | POA: Diagnosis not present

## 2024-02-08 DIAGNOSIS — Z01818 Encounter for other preprocedural examination: Secondary | ICD-10-CM | POA: Diagnosis present

## 2024-02-08 LAB — BASIC METABOLIC PANEL WITH GFR
Anion gap: 8 (ref 5–15)
BUN: 15 mg/dL (ref 8–23)
CO2: 23 mmol/L (ref 22–32)
Calcium: 9.4 mg/dL (ref 8.9–10.3)
Chloride: 106 mmol/L (ref 98–111)
Creatinine, Ser: 1.04 mg/dL — ABNORMAL HIGH (ref 0.44–1.00)
GFR, Estimated: 56 mL/min — ABNORMAL LOW (ref >60)
Glucose, Bld: 92 mg/dL (ref 70–99)
Potassium: 3.7 mmol/L (ref 3.5–5.1)
Sodium: 137 mmol/L (ref 135–145)

## 2024-02-08 MED ORDER — CHLORHEXIDINE GLUCONATE CLOTH 2 % EX PADS: 6.0000 | MEDICATED_PAD | Freq: Once | CUTANEOUS | Status: DC

## 2024-02-08 NOTE — Progress Notes (Signed)
 Ensure presurgery drink given with written/verbal instruction; chg soap given with written/verbal instruction. Pt verbalized understanding         Enhanced Recovery after Surgery for Orthopedics Enhanced Recovery after Surgery is a protocol used to improve the stress on your body and your recovery after surgery.  Patient Instructions  The night before surgery:  No food after midnight. ONLY clear liquids after midnight  The day of surgery (if you do NOT have diabetes):  Drink ONE (1) Pre-Surgery Clear Ensure as directed.   This drink was given to you during your hospital  pre-op appointment visit. The pre-op nurse will instruct you on the time to drink the  Pre-Surgery Ensure depending on your surgery time. Finish the drink at the designated time by the pre-op nurse.  Nothing else to drink after completing the  Pre-Surgery Clear Ensure.  The day of surgery (if you have diabetes): Drink ONE (1) Gatorade 2 (G2) as directed. This drink was given to you during your hospital  pre-op appointment visit.  The pre-op nurse will instruct you on the time to drink the   Gatorade 2 (G2) depending on your surgery time. Color of the Gatorade may vary. Red is not allowed. Nothing else to drink after completing the  Gatorade 2 (G2).         If you have questions, please contact your surgeon's office.

## 2024-02-09 ENCOUNTER — Inpatient Hospital Stay

## 2024-02-09 ENCOUNTER — Inpatient Hospital Stay (HOSPITAL_BASED_OUTPATIENT_CLINIC_OR_DEPARTMENT_OTHER): Admitting: Genetic Counselor

## 2024-02-09 ENCOUNTER — Encounter: Payer: Self-pay | Admitting: Genetic Counselor

## 2024-02-09 ENCOUNTER — Inpatient Hospital Stay: Admitting: Licensed Clinical Social Worker

## 2024-02-09 DIAGNOSIS — C50412 Malignant neoplasm of upper-outer quadrant of left female breast: Secondary | ICD-10-CM

## 2024-02-09 DIAGNOSIS — C50211 Malignant neoplasm of upper-inner quadrant of right female breast: Secondary | ICD-10-CM | POA: Diagnosis not present

## 2024-02-09 DIAGNOSIS — Z170 Estrogen receptor positive status [ER+]: Secondary | ICD-10-CM

## 2024-02-09 NOTE — Progress Notes (Signed)
 [facility] Clinical Social Work  Initial Assessment   Sandy Morales is a 73 y.o. year old female contacted by phone. Clinical Social Work was referred by medical provider for SDOH needs.   SDOH (Social Determinants of Health) assessments performed: Yes SDOH Interventions    Flowsheet Row Clinical Support from 02/09/2024 in [facility] A Dept Of [place]. [facility] Office Visit from 02/06/2024 in [facility] - A Dept Of [place]. [facility]  SDOH Interventions    Food Insecurity Interventions -- [facility] Food Pantry  Housing Interventions -- Intervention Not Indicated  Transportation Interventions Intervention Not Indicated, Patient Resources (Friends/Family) AMB Referral  Utilities Interventions -- Intervention Not Indicated  Depression Interventions/Treatment  -- Counseling    SDOH Screenings   Food Insecurity: Food Insecurity Present (02/06/2024)  Housing: Low Risk  (02/06/2024)  Transportation Needs: No Transportation Needs (02/09/2024)  Recent Concern: Transportation Needs - Unmet Transportation Needs (02/06/2024)  Utilities: Not At Risk (02/06/2024)  Depression (PHQ2-9): Low Risk  (02/09/2024)  Tobacco Use: Low Risk  (02/07/2024)    PHQ 2/9:    02/09/2024   10:35 AM 02/06/2024   10:30 AM  Depression screen PHQ 2/9  Decreased Interest 0 3  Down, Depressed, Hopeless 0 0  PHQ - 2 Score 0 3  Altered sleeping  0  Tired, decreased energy  0  Change in appetite  0  Feeling bad or failure about yourself   0  Trouble concentrating  0  Moving slowly or fidgety/restless  0  Suicidal thoughts  0  PHQ-9 Score  3     Distress Screen completed: No     No data to display            Family/Social Information:  Housing Arrangement: patient lives alone. She has kids and grandkids locally. Her 34 & 47 yo grandkids stay with her sometimes overnight Family members/support persons in your life? Family and [employer]  concerns: no, drives herself. Has family who can help (son is driving her to & from surgery)  Employment: Retired .  Income source: Actor concerns: No Type of concern: Food (previously) Food access concerns: no, has had previously, but not currently. Does not receive SNAP Religious or spiritual practice: Yes-involved in her church Advanced directives: Not known Services Currently in place:  [employer]  Coping/ Adjustment to diagnosis: Patient understands treatment plan and what happens next? yes, scheduled for lumpectomy. Has had a previous mastectomy and reports feeling not bothered by this surgery other than the amount of appointments that she has had Concerns about diagnosis and/or treatment: I'm not especially worried about anything Patient reported stressors: amount of appts Hopes and/or priorities: priority is to stay active and engaged to keep her brain going Patient enjoys time with family/ friends and cooking, being involved in church Current coping skills/ strengths: Religious Affiliation  and Supportive family/friends     SUMMARY: Current SDOH Barriers:  No current barriers identified today  Clinical Social Work Clinical Goal(s):  No clinical social work goals at this time  Interventions: Discussed common feeling and emotions when being diagnosed with cancer, and the importance of support during treatment Informed patient of the support team roles and support services at [facility] Provided CSW contact information and encouraged patient to call with any questions or concerns Discussed resources, such as [employer], if needs arise again   Follow Up Plan: Patient will contact CSW with  any support or resource needs Patient verbalizes understanding of plan: Yes    Sandy Portilla E Yamato Kopf, LCSW Clinical Social Worker [facility]

## 2024-02-09 NOTE — Progress Notes (Signed)
 REFERRING PROVIDER: Loretha Ash, MD [address] [place],  [place] [address]  PRIMARY PROVIDER:  Shelda Atlas, MD  PRIMARY REASON FOR VISIT:  1. Malignant neoplasm of upper-outer quadrant of left breast in female, estrogen receptor positive (HCC)   2. Malignant neoplasm of upper-inner quadrant of right breast in female, estrogen receptor positive (HCC)      HISTORY OF PRESENT ILLNESS:   Ms. Malbrough, a 73 y.o. female, was seen for a [facility] cancer genetics consultation at the request of Dr. Loretha due to a personal and family history of cancer.  Ms. Parrack presents to clinic today to discuss the possibility of a hereditary predisposition to cancer, genetic testing, and to further clarify her future cancer risks, as well as potential cancer risks for family members.   At the age of 65, Ms. Morea was diagnosed with breast cancer.  In 2025, at the age of 60, Ms. Chicoine was diagnosed a second time with breast cancer.      CANCER HISTORY:  Oncology History  Malignant neoplasm of upper-inner quadrant of right breast in female, estrogen receptor positive (HCC)  01/25/2024 Initial Diagnosis   Malignant neoplasm of upper-inner quadrant of right breast in female, estrogen receptor positive (HCC)   01/31/2024 Cancer Staging   Staging form: Breast, AJCC 8th Edition - Clinical stage from 01/31/2024: Stage IA (cT1b, cN0, cM0, G1, ER+, PR+, HER2-) - Signed by Lanell Donald Stagger, PA-C on 01/31/2024 Stage prefix: Initial diagnosis Method of lymph node assessment: Clinical Histologic grading system: 3 grade system     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   Dyslipidemia    GERD (gastroesophageal reflux disease)    Gout    Hypertension    Osteoarthritis    Papilloma of breast    right    Past Surgical History:  Procedure Laterality Date   ABDOMINAL HYSTERECTOMY      BREAST BIOPSY Right 01/11/2024   US  RT BREAST BX W LOC DEV 1ST LESION IMG BX SPEC US  GUIDE 01/11/2024 [facility]   BREAST EXCISIONAL BIOPSY Right 07/17/2020   FLAT EPITHELIAL ATYPIA WITH CALCIFICATIONS,   BREAST LUMPECTOMY WITH RADIOACTIVE SEED LOCALIZATION Right 07/17/2020   Procedure: RIGHT BREAST LUMPECTOMY WITH RADIOACTIVE SEED LOCALIZATION;  Surgeon: Vanderbilt Ned, MD;  Location: [facility];  Service: General;  Laterality: Right;   CYST EXCISION     rt middle finger   INCISION AND DRAINAGE OF WOUND Left 12/16/2012   Procedure: INCISION AND DRAINAGE Hematoma IN CHEST with drain placement;  Surgeon: Alm Sick, MD;  Location: [facility] OR;  Service: Plastics;  Laterality: Left;   KNEE SURGERY     MASTECTOMY Left 04/27/2012   left breast  with tissue expander   MASTECTOMY W/ SENTINEL NODE BIOPSY  04/27/2012   Procedure: MASTECTOMY WITH SENTINEL LYMPH NODE BIOPSY;  Surgeon: Ned LABOR. Cornett, MD;  Location: [facility] OR;  Service: General;  Laterality: Left;  LEFT SIMPLE MASTECTOMY; SENTINEL LYMPH NODE BIOPSY   TISSUE EXPANDER PLACEMENT  04/27/2012   Procedure: TISSUE EXPANDER;  Surgeon: Alm Sick, MD;  Location: [facility] OR;  Service: Plastics;  Laterality: Left;  PLACEMENT OF LEFT BREAST TISSUE EXPANDER    Social History   Socioeconomic History   Marital status: Single    Spouse name: Not on file   Number of children: Not on file   Years of education: Not on  file   Highest education level: Not on file  Occupational History   Not on file  Tobacco Use   Smoking status: Never   Smokeless tobacco: Never   Tobacco comments:    Uses marijuana    Vaping Use   Vaping status: Never Used  Substance and Sexual Activity   Alcohol use: Yes    Comment: beer   Drug use: Yes    Types: Marijuana    Comment: last smoked 02-05-24   Sexual activity: Yes    Birth control/protection: Surgical    Comment: Hysterectomy  Other Topics Concern   Not on file  Social History Narrative    Not on file   Social Drivers of Health   Financial Resource Strain: Not on file  Food Insecurity: Food Insecurity Present (02/06/2024)   Hunger Vital Sign    Worried About Running Out of Food in the Last Year: Never true    Ran Out of Food in the Last Year: Sometimes true  Transportation Needs: No Transportation Needs (02/09/2024)   PRAPARE - Administrator, Civil Service (Medical): No    Lack of Transportation (Non-Medical): No  Recent Concern: Transportation Needs - Unmet Transportation Needs (02/06/2024)   PRAPARE - Administrator, Civil Service (Medical): Yes    Lack of Transportation (Non-Medical): No  Physical Activity: Not on file  Stress: Not on file  Social Connections: Not on file     FAMILY HISTORY:  We obtained a detailed, 4-generation family history.  Significant diagnoses are listed below: Family History  Problem Relation Age of Onset   Cancer Mother        Unknown type of cancer   Throat cancer Father    Cancer Sister        NOS   Cancer Niece        NOS: d. < 50     The patient has two brothers and five sisters.  She is not sure of the types of cancer in the family however, she reports that her younger sister died of cancer, her niece died under age 4 form cancer and her mother died of an 'abdominal' cancer under 50's.  Ms. Reeb is unaware of previous family history of genetic testing for hereditary cancer risks. There is no reported Ashkenazi Jewish ancestry. There is no known consanguinity.  GENETIC COUNSELING ASSESSMENT: Ms. Brunke is a 73 y.o. female with a personal and family history of cancer which is somewhat suggestive of a hereditary cancer syndrome and predisposition to cancer given her two breast cancer diagnosis. We, therefore, discussed and recommended the following at today's visit.   DISCUSSION: We discussed that, in general, most cancer is not inherited in families, but instead is sporadic or familial. Sporadic  cancers occur by chance and typically happen at older ages (>50 years) as this type of cancer is caused by genetic changes acquired during an individual's lifetime. Some families have more cancers than would be expected by chance; however, the ages or types of cancer are not consistent with a known genetic mutation or known genetic mutations have been ruled out. This type of familial cancer is thought to be due to a combination of multiple genetic, environmental, hormonal, and lifestyle factors. While this combination of factors likely increases the risk of cancer, the exact source of this risk is not currently identifiable or testable.  We discussed that 5 - 10% of breast cancer is hereditary, with most cases associated with BRCA mutations.  There are other genes that can be associated with hereditary breast cancer syndromes.  These include ATM, CHEK2 and PALB2.  We discussed that testing is beneficial for several reasons including knowing how to follow individuals after completing their treatment, identifying whether potential treatment options such as PARP inhibitors would be beneficial, and understand if other family members could be at risk for cancer and allow them to undergo genetic testing.   We reviewed the characteristics, features and inheritance patterns of hereditary cancer syndromes. We also discussed genetic testing, including the appropriate family members to test, the process of testing, insurance coverage and turn-around-time for results. We discussed the implications of a negative, positive, carrier and/or variant of uncertain significant result.  Based on Ms. Warrick's personal and family history of cancer, she meets medical criteria for genetic testing. Despite that she meets criteria, she may still have an out of pocket cost. We discussed that if her out of pocket cost for testing is over $100, the laboratory will call and confirm whether she wants to proceed with testing.  If the out  of pocket cost of testing is less than $100 she will be billed by the genetic testing laboratory.   PLAN: Despite our recommendation, Ms. Easler did not wish to pursue genetic testing at today's visit. We understand this decision and remain available to coordinate genetic testing at any time in the future. We, therefore, recommend Ms. Lipps continue to follow the cancer screening guidelines given by her primary healthcare provider.  Lastly, we encouraged Ms. Fukushima to remain in contact with cancer genetics annually so that we can continuously update the family history and inform her of any changes in cancer genetics and testing that may be of benefit for this family.   Ms. Beal questions were answered to her satisfaction today. Our contact information was provided should additional questions or concerns arise. Thank you for the referral and allowing us  to share in the care of your patient.   Riona Lahti P. Perri, MS, CGC Licensed, Patent attorney [email or web address] phone: [phone]  I personally spent a total of 45 minutes in the care of the patient today including getting/reviewing separately obtained history, counseling and educating, and documenting clinical information in the EHR. . The patient was seen alone.  Drs. Lanny Stalls, and/or Gudena were available for questions, if needed..    _______________________________________________________________________ For Office Staff:  Number of people involved in session: 1 Was an Intern/ student involved with case: no

## 2024-02-10 ENCOUNTER — Ambulatory Visit
Admission: RE | Admit: 2024-02-10 | Discharge: 2024-02-10 | Disposition: A | Discharge status: Home / Self Care | Source: Ambulatory Visit | Attending: Surgery | Admitting: Surgery

## 2024-02-10 ENCOUNTER — Other Ambulatory Visit: Payer: Self-pay | Admitting: Surgery

## 2024-02-10 DIAGNOSIS — C50911 Malignant neoplasm of unspecified site of right female breast: Secondary | ICD-10-CM

## 2024-02-10 HISTORY — PX: BREAST BIOPSY: SHX20

## 2024-02-14 ENCOUNTER — Other Ambulatory Visit: Payer: Self-pay

## 2024-02-14 ENCOUNTER — Encounter (HOSPITAL_BASED_OUTPATIENT_CLINIC_OR_DEPARTMENT_OTHER)
Admission: RE | Disposition: A | Discharge status: Home / Self Care | Payer: Self-pay | Source: Home / Self Care | Attending: Surgery

## 2024-02-14 ENCOUNTER — Ambulatory Visit (HOSPITAL_BASED_OUTPATIENT_CLINIC_OR_DEPARTMENT_OTHER)
Admission: RE | Admit: 2024-02-14 | Discharge: 2024-02-14 | Disposition: A | Discharge status: Home / Self Care | Attending: Surgery | Admitting: Surgery

## 2024-02-14 ENCOUNTER — Ambulatory Visit: Admit: 2024-02-14 | Discharge: 2024-02-14 | Disposition: A | Attending: Surgery | Admitting: Surgery

## 2024-02-14 ENCOUNTER — Encounter (HOSPITAL_BASED_OUTPATIENT_CLINIC_OR_DEPARTMENT_OTHER): Payer: Self-pay | Admitting: Surgery

## 2024-02-14 ENCOUNTER — Ambulatory Visit (HOSPITAL_BASED_OUTPATIENT_CLINIC_OR_DEPARTMENT_OTHER): Admitting: Certified Registered"

## 2024-02-14 ENCOUNTER — Ambulatory Visit
Admission: RE | Admit: 2024-02-14 | Discharge: 2024-02-14 | Disposition: A | Source: Ambulatory Visit | Attending: Surgery | Admitting: Surgery

## 2024-02-14 DIAGNOSIS — C50911 Malignant neoplasm of unspecified site of right female breast: Secondary | ICD-10-CM

## 2024-02-14 DIAGNOSIS — I10 Essential (primary) hypertension: Secondary | ICD-10-CM | POA: Insufficient documentation

## 2024-02-14 DIAGNOSIS — C50811 Malignant neoplasm of overlapping sites of right female breast: Secondary | ICD-10-CM | POA: Insufficient documentation

## 2024-02-14 DIAGNOSIS — K219 Gastro-esophageal reflux disease without esophagitis: Secondary | ICD-10-CM | POA: Insufficient documentation

## 2024-02-14 DIAGNOSIS — Z170 Estrogen receptor positive status [ER+]: Secondary | ICD-10-CM | POA: Insufficient documentation

## 2024-02-14 HISTORY — PX: BREAST LUMPECTOMY WITH RADIOACTIVE SEED AND SENTINEL LYMPH NODE BIOPSY: SHX6550

## 2024-02-14 SURGERY — BREAST LUMPECTOMY WITH RADIOACTIVE SEED AND SENTINEL LYMPH NODE BIOPSY
Anesthesia: General | Site: Breast | Laterality: Right

## 2024-02-14 MED ORDER — FENTANYL CITRATE (PF) 100 MCG/2ML IJ SOLN
INTRAMUSCULAR | Status: AC
  Filled 2024-02-14: qty 2

## 2024-02-14 MED ORDER — PROPOFOL 10 MG/ML IV BOLUS
INTRAVENOUS | Status: AC
  Filled 2024-02-14: qty 20

## 2024-02-14 MED ORDER — LIDOCAINE 2% (20 MG/ML) 5 ML SYRINGE
INTRAMUSCULAR | Status: AC
  Filled 2024-02-14: qty 5

## 2024-02-14 MED ORDER — MAGTRACE LYMPHATIC TRACER
INTRAMUSCULAR | Status: DC | PRN
  Administered 2024-02-14: 2 mL via INTRAMUSCULAR

## 2024-02-14 MED ORDER — PHENYLEPHRINE 80 MCG/ML (10ML) SYRINGE FOR IV PUSH (FOR BLOOD PRESSURE SUPPORT)
PREFILLED_SYRINGE | INTRAVENOUS | Status: AC
  Filled 2024-02-14: qty 10

## 2024-02-14 MED ORDER — OXYCODONE HCL 5 MG PO TABS
5.0000 mg | ORAL_TABLET | Freq: Once | ORAL | Status: AC
  Administered 2024-02-14: 5 mg via ORAL

## 2024-02-14 MED ORDER — OXYCODONE HCL 5 MG PO TABS
ORAL_TABLET | ORAL | Status: AC
  Filled 2024-02-14: qty 1

## 2024-02-14 MED ORDER — ONDANSETRON HCL 4 MG/2ML IJ SOLN
INTRAMUSCULAR | Status: AC
  Filled 2024-02-14: qty 2

## 2024-02-14 MED ORDER — DEXAMETHASONE SOD PHOSPHATE PF 10 MG/ML IJ SOLN
INTRAMUSCULAR | Status: DC | PRN
  Administered 2024-02-14: 5 mg via INTRAVENOUS

## 2024-02-14 MED ORDER — OXYCODONE HCL 5 MG PO TABS: 5.0000 mg | ORAL_TABLET | Freq: Four times a day (QID) | ORAL | 0 refills | Status: DC | PRN

## 2024-02-14 MED ORDER — FENTANYL CITRATE (PF) 100 MCG/2ML IJ SOLN
INTRAMUSCULAR | Status: DC | PRN
  Administered 2024-02-14: 50 ug via INTRAVENOUS
  Administered 2024-02-14 (×2): 25 ug via INTRAVENOUS
  Administered 2024-02-14: 50 ug via INTRAVENOUS
  Administered 2024-02-14 (×2): 25 ug via INTRAVENOUS

## 2024-02-14 MED ORDER — ATROPINE SULFATE 0.4 MG/ML IV SOLN
INTRAVENOUS | Status: AC
Start: 2024-02-14 — End: 2024-02-14
  Filled 2024-02-14: qty 1

## 2024-02-14 MED ORDER — LIDOCAINE HCL (CARDIAC) PF 100 MG/5ML IV SOSY
PREFILLED_SYRINGE | INTRAVENOUS | Status: DC | PRN
  Administered 2024-02-14: 20 mg via INTRAVENOUS

## 2024-02-14 MED ORDER — HYDRALAZINE HCL 20 MG/ML IJ SOLN
10.0000 mg | Freq: Once | INTRAMUSCULAR | Status: AC
  Administered 2024-02-14: 10 mg via INTRAVENOUS

## 2024-02-14 MED ORDER — HYDRALAZINE HCL 20 MG/ML IJ SOLN
INTRAMUSCULAR | Status: AC
  Filled 2024-02-14: qty 1

## 2024-02-14 MED ORDER — FENTANYL CITRATE (PF) 100 MCG/2ML IJ SOLN
50.0000 ug | Freq: Once | INTRAMUSCULAR | Status: AC
  Administered 2024-02-14: 50 ug via INTRAVENOUS

## 2024-02-14 MED ORDER — ONDANSETRON HCL 4 MG/2ML IJ SOLN
INTRAMUSCULAR | Status: DC | PRN
  Administered 2024-02-14: 4 mg via INTRAVENOUS

## 2024-02-14 MED ORDER — PROPOFOL 10 MG/ML IV BOLUS
INTRAVENOUS | Status: AC
Start: 2024-02-14 — End: 2024-02-14
  Filled 2024-02-14: qty 20

## 2024-02-14 MED ORDER — CEFAZOLIN SODIUM-DEXTROSE 2-4 GM/100ML-% IV SOLN
2.0000 g | INTRAVENOUS | Status: AC
  Administered 2024-02-14: 2 g via INTRAVENOUS

## 2024-02-14 MED ORDER — FENTANYL CITRATE (PF) 100 MCG/2ML IJ SOLN
25.0000 ug | INTRAMUSCULAR | Status: DC | PRN
  Administered 2024-02-14 (×2): 50 ug via INTRAVENOUS

## 2024-02-14 MED ORDER — CEFAZOLIN SODIUM-DEXTROSE 2-4 GM/100ML-% IV SOLN
INTRAVENOUS | Status: AC
Start: 2024-02-14 — End: 2024-02-14
  Filled 2024-02-14: qty 100

## 2024-02-14 MED ORDER — EPHEDRINE 5 MG/ML INJ
INTRAVENOUS | Status: AC
  Filled 2024-02-14: qty 5

## 2024-02-14 MED ORDER — 0.9 % SODIUM CHLORIDE (POUR BTL) OPTIME
TOPICAL | Status: DC | PRN
Start: 2024-02-14 — End: 2024-02-14
  Administered 2024-02-14: 50 mL

## 2024-02-14 MED ORDER — LACTATED RINGERS IV SOLN: INTRAVENOUS | Status: DC

## 2024-02-14 MED ORDER — PROPOFOL 10 MG/ML IV BOLUS
INTRAVENOUS | Status: DC | PRN
  Administered 2024-02-14: 150 mg via INTRAVENOUS

## 2024-02-14 MED ORDER — MIDAZOLAM HCL 2 MG/2ML IJ SOLN
INTRAMUSCULAR | Status: AC
  Filled 2024-02-14: qty 2

## 2024-02-14 MED ORDER — EPHEDRINE 5 MG/ML INJ
INTRAVENOUS | Status: AC
Start: 2024-02-14 — End: 2024-02-14
  Filled 2024-02-14: qty 5

## 2024-02-14 MED ORDER — CLONIDINE HCL (ANALGESIA) 100 MCG/ML EP SOLN
EPIDURAL | Status: DC | PRN
  Administered 2024-02-14: 50 ug

## 2024-02-14 MED ORDER — EPHEDRINE SULFATE (PRESSORS) 25 MG/5ML IV SOSY
PREFILLED_SYRINGE | INTRAVENOUS | Status: DC | PRN
  Administered 2024-02-14: 5 mg via INTRAVENOUS

## 2024-02-14 MED ORDER — BUPIVACAINE-EPINEPHRINE (PF) 0.25% -1:200000 IJ SOLN
INTRAMUSCULAR | Status: DC | PRN
Start: 2024-02-14 — End: 2024-02-14
  Administered 2024-02-14: 30 mL

## 2024-02-14 MED ORDER — BUPIVACAINE-EPINEPHRINE (PF) 0.5% -1:200000 IJ SOLN
INTRAMUSCULAR | Status: DC | PRN
  Administered 2024-02-14: 30 mL

## 2024-02-14 MED ORDER — AMISULPRIDE (ANTIEMETIC) 5 MG/2ML IV SOLN: 10.0000 mg | Freq: Once | INTRAVENOUS | Status: DC | PRN

## 2024-02-14 SURGICAL SUPPLY — 44 items
BINDER BREAST LRG (GAUZE/BANDAGES/DRESSINGS) IMPLANT
BINDER BREAST MEDIUM (GAUZE/BANDAGES/DRESSINGS) IMPLANT
BINDER BREAST XLRG (GAUZE/BANDAGES/DRESSINGS) IMPLANT
BINDER BREAST XXLRG (GAUZE/BANDAGES/DRESSINGS) IMPLANT
BLADE SURG 15 STRL LF DISP TIS (BLADE) ×1 IMPLANT
CANISTER SUC SOCK COL 7IN (MISCELLANEOUS) IMPLANT
CANISTER SUCT 1200ML W/VALVE (MISCELLANEOUS) ×1 IMPLANT
CHLORAPREP W/TINT 26 (MISCELLANEOUS) ×1 IMPLANT
CLIP APPLIE 9.375 MED OPEN (MISCELLANEOUS) ×1 IMPLANT
COVER BACK TABLE 60X90IN (DRAPES) ×1 IMPLANT
COVER MAYO STAND STRL (DRAPES) ×1 IMPLANT
COVER PROBE CYLINDRICAL 5X96 (MISCELLANEOUS) ×1 IMPLANT
COVER SURGICAL LIGHT HANDLE (MISCELLANEOUS) IMPLANT
DERMABOND ADVANCED .7 DNX12 (GAUZE/BANDAGES/DRESSINGS) ×1 IMPLANT
DRAIN CHANNEL 19F RND (DRAIN) IMPLANT
DRAPE LAPAROSCOPIC ABDOMINAL (DRAPES) ×1 IMPLANT
DRAPE UTILITY XL STRL (DRAPES) ×1 IMPLANT
ELECT COATED BLADE 2.86 ST (ELECTRODE) ×1 IMPLANT
ELECTRODE REM PT RTRN 9FT ADLT (ELECTROSURGICAL) ×1 IMPLANT
EVACUATOR SILICONE 100CC (DRAIN) IMPLANT
GLOVE BIOGEL PI IND STRL 8 (GLOVE) ×1 IMPLANT
GLOVE ECLIPSE 8.0 STRL XLNG CF (GLOVE) ×1 IMPLANT
GOWN STRL REUS W/ TWL LRG LVL3 (GOWN DISPOSABLE) ×2 IMPLANT
GOWN STRL REUS W/ TWL XL LVL3 (GOWN DISPOSABLE) ×1 IMPLANT
HEMOSTAT ARISTA ABSORB 3G PWDR (HEMOSTASIS) IMPLANT
HEMOSTAT SNOW SURGICEL 2X4 (HEMOSTASIS) IMPLANT
KIT MARKER MARGIN INK (KITS) ×1 IMPLANT
NDL HYPO 25X1 1.5 SAFETY (NEEDLE) ×1 IMPLANT
NDL SAFETY ECLIPSE 18X1.5 (NEEDLE) IMPLANT
NEEDLE HYPO 25X1 1.5 SAFETY (NEEDLE) ×1 IMPLANT
NS IRRIG 1000ML POUR BTL (IV SOLUTION) ×1 IMPLANT
PACK BASIN DAY SURGERY FS (CUSTOM PROCEDURE TRAY) ×1 IMPLANT
PENCIL SMOKE EVACUATOR (MISCELLANEOUS) ×1 IMPLANT
SLEEVE SCD COMPRESS KNEE MED (STOCKING) ×1 IMPLANT
SPIKE FLUID TRANSFER (MISCELLANEOUS) IMPLANT
SPONGE T-LAP 4X18 ~~LOC~~+RFID (SPONGE) ×1 IMPLANT
SUT MNCRL AB 4-0 PS2 18 (SUTURE) ×1 IMPLANT
SUT VICRYL 3-0 CR8 SH (SUTURE) ×1 IMPLANT
SYR CONTROL 10ML LL (SYRINGE) ×1 IMPLANT
TOWEL GREEN STERILE FF (TOWEL DISPOSABLE) ×1 IMPLANT
TRACER MAGTRACE VIAL (MISCELLANEOUS) IMPLANT
TRAY FAXITRON CT DISP (TRAY / TRAY PROCEDURE) ×1 IMPLANT
TUBE CONNECTING 20X1/4 (TUBING) ×1 IMPLANT
YANKAUER SUCT BULB TIP NO VENT (SUCTIONS) ×1 IMPLANT

## 2024-02-14 NOTE — Anesthesia Procedure Notes (Signed)
 Procedure Name: LMA Insertion Date/Time: 02/14/2024 12:31 PM  Performed by: Debarah Chiquita LABOR, CRNAPre-anesthesia Checklist: Patient identified, Emergency Drugs available, Suction available and Patient being monitored Patient Re-evaluated:Patient Re-evaluated prior to induction Oxygen Delivery Method: Circle system utilized Preoxygenation: Pre-oxygenation with 100% oxygen Induction Type: IV induction Ventilation: Mask ventilation without difficulty LMA: LMA inserted LMA Size: 4.0 Number of attempts: 1 Airway Equipment and Method: Bite block Placement Confirmation: positive ETCO2 Tube secured with: Tape Dental Injury: Teeth and Oropharynx as per pre-operative assessment

## 2024-02-14 NOTE — Op Note (Signed)
 Preoperative diagnosis: Right breast cancer overlapping sites lobular subtype stage I  Postoperative diagnosis: Same  Procedure: Right breast seed localized lumpectomy utilizing bracketed approach, deep right axillary sentinel lymph node mapping with injection of mag trace  Surgeon: Debby Shipper, MD  Anesthesia: LMA with right pectoral block and 0.25% Marcaine  with epinephrine   EBL: Minimal  Specimen: Right breast tissue with a superior portion, inferior portion, both seeds removed and both clips removed.  Each tissue was oriented separately but represents the same overlapping sites of the medial right breast.  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  Presents for breast conserving surgery to reviewing all of her options.  She had 2 seeds placed to biopsy spots.  We recommended sentinel lymph node mapping due to lobular subtype.The procedure has been discussed with the patient. Alternatives to surgery have been discussed with the patient.  Risks of surgery include bleeding,  Infection,  Seroma formation, death,  and the need for further surgery.   The patient understands and wishes to proceed. Sentinel lymph node mapping and dissection has been discussed with the patient.  Risk of bleeding,  Infection,  Seroma formation,  Additional procedures, arm swelling,  Shoulder weakness ,  Shoulder stiffness,  Nerve and blood vessel injury and reaction to the mapping dyes have been discussed.  Alternatives to surgery have been discussed with the patient.  The patient agrees to proceed.   Description of procedure: The patient was met in the holding area and questions were answered.  The right breast was marked as the correct site and seeds were placed as an outpatient by the radiologist.  Pectoral  block administered by anesthesia.  She was then taken back to the operating and placed supine upon the operating table.  After induction of general anesthesia, a timeout was performed.  The patient was then prepped and draped in a sterile fashion.  A second timeout was performed.  2 cc of mag tracer injected into the deep right lateral breast.  This massaged for 5 minutes.  Neoprobe was used to identify both seeds in the medial right breast.  1 was superior and was inferior.  A transverse incision was made between the 2 segments.  All tissue around the superior seed was removed.  The more inferolateral seed was removed separately with its tissue.  That seed came out and was placed in a separate container.  Both tissue specimens were oriented with ink.  Imaging of both revealed clips to be in both.  The superior edge of the tissue of both the seed and clip of the inferior only had the clip.  Both seeds were then imaged.  Everything was sent to pathology after orientation.  Gross margins were negative.  I placed a 19 drain through a separate stab incision in the lumpectomy cavity due to its size.  I then approximated the deep tissue planes down with 3-0 Vicryl.  4-0 Monocryl was used to close the skin.  2-0 nylon used to hold the drain in place.  This was placed to bulb suction.  The right axillary sentinel lymph node mapping procedure was done next.  The mag trace probe/symptomatic probe was used to identify signal in the right axilla.  A transverse incision was made along the inferior border of the right Eksir hairline.  Dissection was carried down to the level and contents which were deep.  Identified 3 sentinel nodes which were level 1 sentinel nodes due to discoloration signal.  There were no other spikes or areas of discoloration of the nodes noted.  Hemostasis was achieved.  The long thoracic nerve, left cervical strong extra vein were preserved.  The deep tissue planes were approximated with 3-0 Vicryl.  4  Monocryl used to close the skin in a subcuticular fashion.  Dermabond applied.  Breast binder placed.  All counts found to be correct.  The patient was then awoke extubated taken to recovery in satisfactory condition.

## 2024-02-14 NOTE — Anesthesia Procedure Notes (Signed)
 Anesthesia Regional Block: Pectoralis block   Pre-Anesthetic Checklist: , timeout performed,  Correct Patient, Correct Site, Correct Laterality,  Correct Procedure, Correct Position, site marked,  Risks and benefits discussed,  Surgical consent,  Pre-op evaluation,  At surgeon's request and post-op pain management  Laterality: Right  Prep: chloraprep       Needles:  Injection technique: Single-shot  Needle Type: Echogenic Needle     Needle Length: 9cm  Needle Gauge: 21     Additional Needles:   Procedures:,,,, ultrasound used (permanent image in chart),,    Narrative:  Start time: 02/14/2024 10:55 AM End time: 02/14/2024 11:02 AM Injection made incrementally with aspirations every 5 mL.  Performed by: Personally  Anesthesiologist: Epifanio Charleston, MD

## 2024-02-14 NOTE — Transfer of Care (Signed)
 Immediate Anesthesia Transfer of Care Note  Patient: Sandy Morales  Procedure(s) Performed: BREAST LUMPECTOMY WITH RADIOACTIVE SEED AND SENTINEL LYMPH NODE BIOPSY (Right: Breast)  Patient Location: PACU  Anesthesia Type:GA combined with regional for post-op pain  Level of Consciousness: drowsy  Airway & Oxygen Therapy: Patient Spontanous Breathing and Patient connected to face mask oxygen  Post-op Assessment: Report given to RN and Post -op Vital signs reviewed and stable  Post vital signs: Reviewed and stable  Last Vitals:  Vitals Value Taken Time  BP 179/93 (116)   Temp    Pulse 69   Resp 18 02/14/24 14:10  SpO2 100 %   Vitals shown include unfiled device data.  Last Pain:  Vitals:   02/14/24 1021  TempSrc: Temporal  PainSc: 0-No pain      Patients Stated Pain Goal: 4 (02/14/24 1021)  Complications: No notable events documented.

## 2024-02-14 NOTE — Interval H&P Note (Signed)
 History and Physical Interval Note:  02/14/2024 10:23 AM  Sandy Morales  has presented today for surgery, with the diagnosis of RIGHT BREAST CANCER.  The various methods of treatment have been discussed with the patient and family. After consideration of risks, benefits and other options for treatment, the patient has consented to  Procedure(s) with comments: BREAST LUMPECTOMY WITH RADIOACTIVE SEED AND SENTINEL LYMPH NODE BIOPSY (Right) - GEN w/PEC BLOCK RIGHT BREAST SEED LUMPECTOMY RIGHT SENTINEL LYMPH NODE MAPPING as a surgical intervention.  The patient's history has been reviewed, patient examined, no change in status, stable for surgery.  I have reviewed the patient's chart and labs.  Questions were answered to the patient's satisfaction.     Marlyce Mcdougald A Lesette Frary

## 2024-02-14 NOTE — H&P (Signed)
 Chief Complaint: NEW BREAST CANCER  History of Present Illness: Sandy Morales is a 73 y.o. 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 She had issues and had a hematoma after that and that was revised. She still has her implant in the left. She presents for evaluation of new right breast mammographic abnormality upper outer quadrant. 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 She is on no antiestrogen therapy currently. 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 She has no complaints and feels well.  Review of Systems: A complete review of systems was obtained from the patient. I have reviewed this information and discussed as appropriate with the patient. See HPI as well for other ROS.    Medical History: Past Medical History:  Diagnosis Date  Arthritis  DVT (deep venous thrombosis) (CMS/HHS-HCC)   There is no problem list on file for this patient.  History reviewed. No pertinent surgical history.   No Known Allergies  Current Outpatient Medications on File Prior to Visit  Medication Sig Dispense Refill  allopurinoL  (ZYLOPRIM ) 100 MG tablet Take 100 mg by mouth once daily  benazepriL  (LOTENSIN ) 10 MG tablet  cholecalciferol (VITAMIN D3) 1000 unit capsule Take 1,000 Units by mouth once daily  clotrimazole-betamethasone (LOTRISONE) 1-0.05 % cream  diphenhydrAMINE  (BENADRYL ) 25 mg capsule Take 25 mg by mouth every 6 (six) hours as needed for Itching  EPINEPHrine  (EPIPEN ) 0.3 mg/0.3 mL auto-injector  famotidine  (PEPCID ) 20 MG tablet Take 20 mg by mouth 2 (two) times  daily  gabapentin  (NEURONTIN ) 100 MG capsule  levocetirizine (XYZAL) 5 MG tablet  pravastatin (PRAVACHOL) 80 MG tablet Take 80 mg by mouth once daily  tiZANidine (ZANAFLEX) 4 MG tablet   No current facility-administered medications on file prior to visit.   History reviewed. No pertinent family history.   Social History   Tobacco Use  Smoking Status Never  Smokeless Tobacco Never    Social History   Socioeconomic History  Marital status: Single  Tobacco Use  Smoking status: Never  Smokeless tobacco: Never  Substance and Sexual Activity  Alcohol use: Never  Drug use: Never   Social Drivers of Health   Housing Stability: Unknown (01/19/2024)  Housing Stability Vital Sign  Homeless in the Last Year: No   Objective:   Vitals:  01/19/24 0933  BP: (!) 140/80  Pulse: 83  Temp: 36.7 C (98 F)  SpO2: 99%  Weight: 81.8 kg (180 lb 6.4 oz)  Height: 152.4 cm (5')  PainSc: 0-No pain   Body mass index is 35.23 kg/m.  Physical Exam Exam conducted with a chaperone present (brooks).  HENT:  Head: Normocephalic.  Cardiovascular:  Rate and Rhythm: Normal rate.  Pulmonary:  Effort: Pulmonary effort is normal.  Chest:   Comments: Left breast reconstruction intact. No left sided masses noted over the reconstruction. No left axillary adenopathy. Right breast scar is noted from previous lumpectomies. Mass to be palpated in the upper outer quadrant measuring about a centimeter felt like. There is no evidence of right axillary adenopathy. No evidence of supraclavicular adenopathy. Musculoskeletal:  Cervical back: Normal range of motion.  Skin: General: Skin is warm.  Neurological:  General: No focal deficit present.  Mental Status: She is alert.  Psychiatric:  Mood and Affect: Mood normal.     Labs, Imaging and Diagnostic Testing:  CLINICAL DATA: 73 year old female recalled for a possible MLO asymmetry/architectural distortion in the right breast.  EXAM: DIGITAL  DIAGNOSTIC UNILATERAL RIGHT MAMMOGRAM WITH TOMOSYNTHESIS AND CAD; ULTRASOUND RIGHT BREAST LIMITED  TECHNIQUE: Right digital diagnostic mammography and breast tomosynthesis was performed. Additional 2 and 3D spot compression views were obtained. The images were evaluated with computer-aided detection. ; Targeted ultrasound examination of the right breast was performed  COMPARISON: Previous exam(s).  ACR Breast Density Category c: The breasts are heterogeneously dense, which may obscure small masses.  FINDINGS: Right Mammogram: Spot-compression views demonstrate a possible equal density spiculated mass measuring up to 13 mm about the retroareolar breast, middle to posterior third.  Right breast ultrasound: Targeted imaging at about the 2 o'clock position, approximately 6 centimeters from the nipple, demonstrates an irregular hypoechoic mass with spiculated margins measuring approximately 8 x 7 x 5 mm. Adjacent to this spiculated mass is an oval hypoechoic mass measuring approximately 5 x7 x 3 mm. This mass may represent a complicated versus simple cyst. The spiculated mass correlates favorably with the mammographic findings described above. An incidental cyst was identified about the 3 o'clock position, approximately 6 cm from the nipple measuring approximately 7 x 4 x 6 mm. The right axilla was unremarkable.  IMPRESSION: Suspicious right breast mass as above.  RECOMMENDATION: Right breast ultrasound-guided biopsy.  I have discussed the findings and recommendations with the patient. If applicable, a reminder letter will be sent to the patient regarding the next appointment.  BI-RADS CATEGORY 4: Suspicious.   Electronically Signed By: Curtistine Noble On: 01/10/2024 13:43   1. Breast, right, needle core biopsy, 2:00 6cmfn (ribbon clip) : INVASIVE LOBULAR CARCINOMA, GRADE 1 (3+1+1) LOBULAR CARCINOMA IN SITU (LCIS) TUBULE FORMATION: SCORE 3 NUCLEAR PLEOMORPHISM: SCORE  1 MITOTIC COUNT: SCORE 1 TOTAL SCORE: 6 OVERALL OVERALL GRADE: GRADE 2 (6/9) ANGIOLYMPHATIC INVASION NOT IDENTIFIED NEGATIVE FOR MICROCALCIFICATIONS TUMOR MEASURES 12 MM IN GREATEST LINEAR EXTENT FOCAL CYSTIC PAPILLARY APOCRINE METAPLASIA  Diagnosis Note : An immunohistochemical stain for E-cadherin performed with adequate control is negative within the tumor supporting a lobular differentiation. Immunohistochemical stains for breast prognostic markers have been ordered, and these results will be issued within a subsequent addendum to this report. Case is reviewed by Dr. Macie who concurs with the interpretation. Diagnosis called to [facility] at [facility] by Dr. Reed on 01/12/2024 at 9:55 AM.  DATE SIGNED OUT: 01/13/2024 ELECTRONIC SIGNATURE : Picklesimer Md, Fred , Sports Administrator, Electronic Signature  MICROSCOPIC DESCRIPTION  CASE COMMENTS STAINS USED IN DIAGNOSIS: H&E-2 H&E-3 H&E-4 H&E E-CAD *RECUT 1 SLIDE Universal Negative Control-DAB Stains used in diagnosis 1 Her2 by IHC, 1 ER-ACIS, 1 KI-67-ACIS, 1 PR-ACIS IHC scores are reported using ASCO/CAP scoring criteria. An IHC Score of 0 or 1+ is NEGATIVE for HER2, 3+ is POSITIVE for HER2, and 2+ is EQUIVOCAL. Equivocal results are reflexed to either FISH or IHC testing. Specimens are fixed in 10% Neutral Buffered Formalin for at least 6 hours and up to 72 hours. These tests have not be validated on decalcified tissue. Results should be interpreted with caution given the possibility of false negative results on decalcified specimens. Antibody Clone for HER2 is 4B5 (PATHWAY). Some of these immunohistochemical stains may have been developed and the performance characteristics determined by [facility]. Some may not have been cleared or approved by the U.S. Food and Drug Administration. The FDA has determined that such clearance or approval is not necessary. This test is used for clinical  purposes. It should not be regarded as investigational or for research. This laboratory is certified under the Clinical Laboratory Improvement Amendments of 1988 (CLIA-88) as qualified to perform high complexity clinical laboratory testing. Estrogen receptor (6F11), immunohistochemical stains are performed on formalin fixed, paraffin embedded tissue using a 3,3-diaminobenzidine (DAB) chromogen and Leica Bond Autostainer System. The staining intensity of the nucleus is scored manually and is reported as the percentage of tumor cell nuclei demonstrating specific nuclear staining.Specimens are fixed in 10% Neutral Buffered Formalin for at least 6 hours and up to 72 hours. These tests have not be validated on decalcified tissue. Results should be interpreted with caution given the possibility of false negative results on decalcified specimens. Ki-67 (MM1), immunohistochemical stains are performed on formalin fixed, paraffin embedded tissue using a 3,3-diaminobenzidine (DAB) chromogen and Leica Bond Autostainer System. The staining intensity of the nucleus is scored manually and is reported as the percentage of tumor cell nuclei demonstrating specific nuclear staining.Specimens are fixed in 10% Neutral Buffered Formalin for at least 6 hours and up to 72 hours. These tests have not be validated on decalcified tissue. Results should be interpreted with caution given the possibility of false negative results on decalcified specimens. PR progesterone receptor (16), immunohistochemical stains are performed on formalin fixed, paraffin embedded tissue using a 3,3-diaminobenzidine (DAB) chromogen and Leica Bond Autostainer System. The staining intensity of the nucleus is scored manually and is reported as the percentage of tumor cell nuclei demonstrating specific nuclear staining.Specimens are fixed in 10% Neutral Buffered Formalin for at least 6 hours and up to 72 hours. These tests have not be  validated on decalcified tissue. Results should be interpreted with caution given the possibility of false negative results on decalcified specimens.  ADDENDUM 1) Breast, right, needle core biopsy, 2:00 6 cmfn (ribbon clip) PROGNOSTIC INDICATORS  Results: IMMUNOHISTOCHEMICAL AND MORPHOMETRIC ANALYSIS PERFORMED MANUALLY The tumor cells are NEGATIVE for Her2 (0+). Estrogen Receptor: 100%, POSITIVE, STRONG STAINING INTENSITY Progesterone Receptor: 90%, POSITIVE, STRONG STAINING INTENSITY Proliferation Marker Ki67: 20% REFERENCE RANGE ESTROGEN RECEPTOR NEGATIVE 0% POSITIVE =>1% REFERENCE RANGE PROGESTERONE RECEPTOR NEGATIVE 0% POSITIVE =>1% All controls stained appropriately Picklesimer Md, Fred , Sports Administrator, Electronic Signature ( Signed 09 29 2025)  CLINICAL HISTORY  SPECIMEN(S) OBTAINED 1. Breast, right, needle core biopsy, 2:00 6cmfn (ribbon Clip)  SPECIMEN COMMENTS: 1. TIF: 1204 hrs CIT: < 1 min SPECIMEN CLINICAL INFORMATION: 1. 73 y/o F w/ a spiculated rt breast mass, prior lt mastectomy, three 14 ga core specimens  Gross Description 1. Received in formalin labeled with the patient's name and right breast 2 o'clock 6 cm from nipple and consists of three cores of tan yellow fibroadipose tissue, ranging from 1.5 x 0.1 x 0.1 cm to 2.0 x 0.1 x 0.1 cm. The specimen is entirely submitted in one cassette. TIF 12:04 p.m. on 01/11/24. CIT less than 1 min. (KL:gt, 9/2   Assessment and Plan:   Diagnoses and all orders for this visit:  Breast cancer, stage 1, right (CMS/HHS-HCC) - MRI breast bilateral with and without contrast; Future - Ambulatory Referral to Oncology-Medical - Ambulatory Referral to Radiation Oncology   Discussed surgical options and the patient would like to proceed with right breast seed lumpectomy followed by right breast radiation therapy if needed as well as consideration for  antiestrogen therapy. Refer to medical and radiation oncology for opinions.  Plan for MRI due to lobular subtype and previous surgeries.The procedure has been discussed with the patient. Alternatives to surgery have been discussed with the patient. Risks of surgery include bleeding, Infection, Seroma formation, death, lymphedema, arm stiffness and pain, arm numbness, injury to major vascular and/or neurologic function structures And the need for further surgery. Long-term survival, local regional recurrence and quality of life reviewed today. The patient understands and wishes to proceed.    DEBBY CURTISTINE SHIPPER, MD

## 2024-02-14 NOTE — Discharge Instructions (Addendum)
[facility] Office Phone Number [phone]  BREAST BIOPSY/ PARTIAL MASTECTOMY: POST OP INSTRUCTIONS  Always review your discharge instruction sheet given to you by the facility where your surgery was performed.  IF YOU HAVE DISABILITY OR FAMILY LEAVE FORMS, YOU MUST BRING THEM TO THE OFFICE FOR PROCESSING.  DO NOT GIVE THEM TO YOUR DOCTOR.  A prescription for pain medication may be given to you upon discharge.  Take your pain medication as prescribed, if needed.  If narcotic pain medicine is not needed, then you may take acetaminophen (Tylenol) or ibuprofen (Advil) as needed. Take your usually prescribed medications unless otherwise directed If you need a refill on your pain medication, please contact your pharmacy.  They will contact our office to request authorization.  Prescriptions will not be filled after 5pm or on week-ends. You should eat very light the first 24 hours after surgery, such as soup, crackers, pudding, etc.  Resume your normal diet the day after surgery. Most patients will experience some swelling and bruising in the breast.  Ice packs and a good support bra will help.  Swelling and bruising can take several days to resolve.  It is common to experience some constipation if taking pain medication after surgery.  Increasing fluid intake and taking a stool softener will usually help or prevent this problem from occurring.  A mild laxative (Milk of Magnesia or Miralax) should be taken according to package directions if there are no bowel movements after 48 hours. Unless discharge instructions indicate otherwise, you may remove your bandages 24-48 hours after surgery, and you may shower at that time.  You may have steri-strips (small skin tapes) in place directly over the incision.  These strips should be left on the skin for 7-10 days.  If your surgeon used skin glue on the incision, you may shower in 24 hours.  The glue will flake off over the next 2-3 weeks.  Any  sutures or staples will be removed at the office during your follow-up visit. ACTIVITIES:  You may resume regular daily activities (gradually increasing) beginning the next day.  Wearing a good support bra or sports bra minimizes pain and swelling.  You may have sexual intercourse when it is comfortable. You may drive when you no longer are taking prescription pain medication, you can comfortably wear a seatbelt, and you can safely maneuver your car and apply brakes. RETURN TO WORK:  ______________________________________________________________________________________ You should see your doctor in the office for a follow-up appointment approximately two weeks after your surgery.  Your doctor's nurse will typically make your follow-up appointment when she calls you with your pathology report.  Expect your pathology report 2-3 business days after your surgery.  You may call to check if you do not hear from us after three days. OTHER INSTRUCTIONS: _______________________________________________________________________________________________ _____________________________________________________________________________________________________________________________________ _____________________________________________________________________________________________________________________________________ _____________________________________________________________________________________________________________________________________  WHEN TO CALL YOUR DOCTOR: Fever over 101.0 Nausea and/or vomiting. Extreme swelling or bruising. Continued bleeding from incision. Increased pain, redness, or drainage from the incision.  The clinic staff is available to answer your questions during regular business hours.  Please don't hesitate to call and ask to speak to one of the nurses for clinical concerns.  If you have a medical emergency, go to the nearest emergency room or call 911.  A surgeon from [facility] is always on call at the hospital.  For further questions, please visit [email or web address]    Post Anesthesia Home Care Instructions  Activity: Get plenty of rest for the remainder of   the day. A responsible individual must stay with you for 24 hours following the procedure.  For the next 24 hours, DO NOT: -Drive a car -Operate machinery -Drink alcoholic beverages -Take any medication unless instructed by your physician -Make any legal decisions or sign important papers.  Meals: Start with liquid foods such as gelatin or soup. Progress to regular foods as tolerated. Avoid greasy, spicy, heavy foods. If nausea and/or vomiting occur, drink only clear liquids until the nausea and/or vomiting subsides. Call your physician if vomiting continues.  Special Instructions/Symptoms: Your throat may feel dry or sore from the anesthesia or the breathing tube placed in your throat during surgery. If this causes discomfort, gargle with warm salt water. The discomfort should disappear within 24 hours.  If you had a scopolamine patch placed behind your ear for the management of post- operative nausea and/or vomiting:  1. The medication in the patch is effective for 72 hours, after which it should be removed.  Wrap patch in a tissue and discard in the trash. Wash hands thoroughly with soap and water. 2. You may remove the patch earlier than 72 hours if you experience unpleasant side effects which may include dry mouth, dizziness or visual disturbances. 3. Avoid touching the patch. Wash your hands with soap and water after contact with the patch.      

## 2024-02-14 NOTE — Anesthesia Preprocedure Evaluation (Addendum)
 Anesthesia Evaluation  Patient identified by MRN, date of birth, ID band Patient awake    Reviewed: Allergy & Precautions, NPO status , Patient's Chart, lab work & pertinent test results  Airway Mallampati: II  TM Distance: >3 FB Neck ROM: Full    Dental  (+) Dental Advisory Given   Pulmonary neg pulmonary ROS   breath sounds clear to auscultation       Cardiovascular hypertension, Pt. on medications  Rhythm:Regular Rate:Normal     Neuro/Psych negative neurological ROS     GI/Hepatic Neg liver ROS,GERD  ,,  Endo/Other  negative endocrine ROS    Renal/GU negative Renal ROS     Musculoskeletal  (+) Arthritis ,    Abdominal   Peds  Hematology negative hematology ROS (+)   Anesthesia Other Findings   Reproductive/Obstetrics                              Anesthesia Physical Anesthesia Plan  ASA: 2  Anesthesia Plan: General   Post-op Pain Management: Tylenol  PO (pre-op)* and Regional block*   Induction: Intravenous  PONV Risk Score and Plan: 3 and Dexamethasone, Ondansetron , Treatment may vary due to age or medical condition and Propofol  infusion  Airway Management Planned: LMA  Additional Equipment:   Intra-op Plan:   Post-operative Plan: Extubation in OR  Informed Consent: I have reviewed the patients History and Physical, chart, labs and discussed the procedure including the risks, benefits and alternatives for the proposed anesthesia with the patient or authorized representative who has indicated his/her understanding and acceptance.     Dental advisory given  Plan Discussed with: CRNA  Anesthesia Plan Comments:         Anesthesia Quick Evaluation

## 2024-02-14 NOTE — Progress Notes (Signed)
Assisted Dr. Rob Fitzgerald with right, pectoralis, ultrasound guided block. Side rails up, monitors on throughout procedure. See vital signs in flow sheet. Tolerated Procedure well. 

## 2024-02-14 NOTE — Anesthesia Postprocedure Evaluation (Signed)
 Anesthesia Post Note  Patient: Sandy Morales  Procedure(s) Performed: BREAST LUMPECTOMY WITH RADIOACTIVE SEED AND SENTINEL LYMPH NODE BIOPSY (Right: Breast)     Patient location during evaluation: PACU Anesthesia Type: General Level of consciousness: awake and alert Pain management: pain level controlled Vital Signs Assessment: post-procedure vital signs reviewed and stable Respiratory status: spontaneous breathing, nonlabored ventilation, respiratory function stable and patient connected to nasal cannula oxygen Cardiovascular status: blood pressure returned to baseline and stable Postop Assessment: no apparent nausea or vomiting Anesthetic complications: no   No notable events documented.  Last Vitals:  Vitals:   02/14/24 1445 02/14/24 1500  BP: (!) 156/76 (!) 144/69  Pulse: 78 84  Resp: (!) 22 19  Temp:    SpO2: 100% 96%    Last Pain:  Vitals:   02/14/24 1500  TempSrc:   PainSc: 4                  Epifanio Lamar BRAVO

## 2024-02-15 ENCOUNTER — Encounter (HOSPITAL_BASED_OUTPATIENT_CLINIC_OR_DEPARTMENT_OTHER): Payer: Self-pay | Admitting: Surgery

## 2024-02-20 LAB — SURGICAL PATHOLOGY

## 2024-02-21 ENCOUNTER — Encounter: Payer: Self-pay | Admitting: *Deleted

## 2024-02-21 ENCOUNTER — Telehealth: Payer: Self-pay | Admitting: *Deleted

## 2024-02-21 NOTE — Telephone Encounter (Signed)
Ordered oncotype per Dr. Iruku. Sent requisition to pathology and exact sciences. 

## 2024-02-22 ENCOUNTER — Encounter (HOSPITAL_COMMUNITY): Payer: Self-pay

## 2024-02-22 ENCOUNTER — Inpatient Hospital Stay (HOSPITAL_COMMUNITY)
Admission: EM | Admit: 2024-02-22 | Discharge: 2024-02-24 | DRG: 584 | Disposition: A | Discharge status: Home / Self Care | Attending: Surgery | Admitting: Surgery

## 2024-02-22 ENCOUNTER — Other Ambulatory Visit: Payer: Self-pay

## 2024-02-22 DIAGNOSIS — C50911 Malignant neoplasm of unspecified site of right female breast: Secondary | ICD-10-CM | POA: Diagnosis present

## 2024-02-22 DIAGNOSIS — M109 Gout, unspecified: Secondary | ICD-10-CM | POA: Diagnosis present

## 2024-02-22 DIAGNOSIS — Z808 Family history of malignant neoplasm of other organs or systems: Secondary | ICD-10-CM

## 2024-02-22 DIAGNOSIS — Z90710 Acquired absence of both cervix and uterus: Secondary | ICD-10-CM

## 2024-02-22 DIAGNOSIS — I10 Essential (primary) hypertension: Secondary | ICD-10-CM | POA: Diagnosis present

## 2024-02-22 DIAGNOSIS — Z9012 Acquired absence of left breast and nipple: Secondary | ICD-10-CM | POA: Diagnosis not present

## 2024-02-22 DIAGNOSIS — N6489 Other specified disorders of breast: Principal | ICD-10-CM | POA: Diagnosis present

## 2024-02-22 DIAGNOSIS — D62 Acute posthemorrhagic anemia: Secondary | ICD-10-CM | POA: Diagnosis not present

## 2024-02-22 DIAGNOSIS — K219 Gastro-esophageal reflux disease without esophagitis: Secondary | ICD-10-CM | POA: Diagnosis not present

## 2024-02-22 DIAGNOSIS — Z79899 Other long term (current) drug therapy: Secondary | ICD-10-CM | POA: Diagnosis not present

## 2024-02-22 DIAGNOSIS — E785 Hyperlipidemia, unspecified: Secondary | ICD-10-CM | POA: Diagnosis present

## 2024-02-22 LAB — BASIC METABOLIC PANEL WITH GFR
Anion gap: 14 (ref 5–15)
BUN: 30 mg/dL — ABNORMAL HIGH (ref 8–23)
CO2: 23 mmol/L (ref 22–32)
Calcium: 9.9 mg/dL (ref 8.9–10.3)
Chloride: 103 mmol/L (ref 98–111)
Creatinine, Ser: 1.15 mg/dL — ABNORMAL HIGH (ref 0.44–1.00)
GFR, Estimated: 50 mL/min — ABNORMAL LOW (ref >60)
Glucose, Bld: 125 mg/dL — ABNORMAL HIGH (ref 70–99)
Potassium: 4.4 mmol/L (ref 3.5–5.1)
Sodium: 139 mmol/L (ref 135–145)

## 2024-02-22 LAB — CBC WITH DIFFERENTIAL/PLATELET
Abs Immature Granulocytes: 0.03 K/uL (ref 0.00–0.07)
Basophils Absolute: 0 K/uL (ref 0.0–0.1)
Basophils Relative: 1 %
Eosinophils Absolute: 0.3 K/uL (ref 0.0–0.5)
Eosinophils Relative: 4 %
HCT: 38.2 % (ref 36.0–46.0)
Hemoglobin: 11.8 g/dL — ABNORMAL LOW (ref 12.0–15.0)
Immature Granulocytes: 1 %
Lymphocytes Relative: 22 %
Lymphs Abs: 1.4 K/uL (ref 0.7–4.0)
MCH: 31.3 pg (ref 26.0–34.0)
MCHC: 30.9 g/dL (ref 30.0–36.0)
MCV: 101.3 fL — ABNORMAL HIGH (ref 80.0–100.0)
Monocytes Absolute: 0.7 K/uL (ref 0.1–1.0)
Monocytes Relative: 12 %
Neutro Abs: 3.9 K/uL (ref 1.7–7.7)
Neutrophils Relative %: 60 %
Platelets: 244 K/uL (ref 150–400)
RBC: 3.77 MIL/uL — ABNORMAL LOW (ref 3.87–5.11)
RDW: 12.5 % (ref 11.5–15.5)
WBC: 6.4 K/uL (ref 4.0–10.5)
nRBC: 0 % (ref 0.0–0.2)

## 2024-02-22 MED ORDER — ONDANSETRON HCL 4 MG/2ML IJ SOLN
4.0000 mg | Freq: Once | INTRAMUSCULAR | Status: AC
  Administered 2024-02-22: 4 mg via INTRAVENOUS
  Filled 2024-02-22: qty 2

## 2024-02-22 MED ORDER — MORPHINE SULFATE (PF) 4 MG/ML IV SOLN
4.0000 mg | Freq: Once | INTRAVENOUS | Status: AC
  Administered 2024-02-22: 4 mg via INTRAVENOUS
  Filled 2024-02-22: qty 1

## 2024-02-22 NOTE — H&P (Signed)
 Sandy Morales is an 73 y.o. female.   Chief Complaint: right breast hematoma HPI:  Pt was brought to ED by EMS after having sudden severe right  breast pain with rush of blood into drain bulb.  Right breast also got suddenly tense and swollen. She called EMS. She had seed bracketed lumpectomy 10/28 by Dr. Vanderbilt.  It sounds as though a larger lumpectomy had to be performed and Dr. Vanderbilt closed down the deeper layers and left a drain, which is atypical.  Pt did not have any dizziness.    Pain was severe and pt required multiple doses of IV pain meds since being in the ED.    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   Dyslipidemia    GERD (gastroesophageal reflux disease)    Gout    Hypertension    Osteoarthritis    Papilloma of breast    right    Past Surgical History:  Procedure Laterality Date   ABDOMINAL HYSTERECTOMY     BREAST BIOPSY Right 01/11/2024   US  RT BREAST BX W LOC DEV 1ST LESION IMG BX SPEC US  GUIDE 01/11/2024 [facility]   BREAST BIOPSY  02/10/2024   MM RT RADIOACTIVE SEED LOC MAMMO GUIDE 02/10/2024 [facility]   BREAST BIOPSY  02/10/2024   MM RT RADIOACTIVE SEED EA ADD LESION LOC MAMMO GUIDE 02/10/2024 [facility]   BREAST EXCISIONAL BIOPSY Right 07/17/2020   FLAT EPITHELIAL ATYPIA WITH CALCIFICATIONS,   BREAST LUMPECTOMY WITH RADIOACTIVE SEED AND SENTINEL LYMPH NODE BIOPSY Right 02/14/2024   Procedure: BREAST LUMPECTOMY WITH RADIOACTIVE SEED AND SENTINEL LYMPH NODE BIOPSY;  Surgeon: Vanderbilt Ned, MD;  Location: [facility];  Service: General;  Laterality: Right;  GEN w/PEC BLOCK RIGHT BREAST SEED LUMPECTOMY RIGHT SENTINEL LYMPH NODE MAPPING   BREAST LUMPECTOMY WITH RADIOACTIVE SEED LOCALIZATION Right 07/17/2020   Procedure: RIGHT BREAST LUMPECTOMY WITH RADIOACTIVE SEED LOCALIZATION;  Surgeon: Vanderbilt Ned, MD;  Location:  [facility];  Service: General;  Laterality: Right;   CYST EXCISION     rt middle finger   INCISION AND DRAINAGE OF WOUND Left 12/16/2012   Procedure: INCISION AND DRAINAGE Hematoma IN CHEST with drain placement;  Surgeon: Alm Sick, MD;  Location: [facility] OR;  Service: Plastics;  Laterality: Left;   KNEE SURGERY     MASTECTOMY Left 04/27/2012   left breast  with tissue expander   MASTECTOMY W/ SENTINEL NODE BIOPSY  04/27/2012   Procedure: MASTECTOMY WITH SENTINEL LYMPH NODE BIOPSY;  Surgeon: Ned LABOR. Cornett, MD;  Location: [facility] OR;  Service: General;  Laterality: Left;  LEFT SIMPLE MASTECTOMY; SENTINEL LYMPH NODE BIOPSY   TISSUE EXPANDER PLACEMENT  04/27/2012   Procedure: TISSUE EXPANDER;  Surgeon: Alm Sick, MD;  Location: [facility] OR;  Service: Plastics;  Laterality: Left;  PLACEMENT OF LEFT BREAST TISSUE EXPANDER    Family History  Problem Relation Age of Onset   Cancer Mother        Unknown type of cancer   Throat cancer Father    Cancer Sister        NOS   Cancer Niece        NOS: d. < 50   Social History:  reports that she has never smoked. She has never used smokeless tobacco. She reports current alcohol use. She reports current drug use. Drug: Marijuana.  Allergies: No Known  Allergies  Medications Prior to Admission  Medication Sig Dispense Refill   allopurinol  (ZYLOPRIM ) 100 MG tablet Take 100 mg by mouth daily.     benazepril -hydrochlorthiazide (LOTENSIN  HCT) 10-12.5 MG tablet Take 1 tablet by mouth daily.     Calcium Carbonate (CALCIUM 600 PO) Take 600 mg by mouth daily.     Cholecalciferol (VITAMIN D -3) 1000 UNITS CAPS Take 1 capsule by mouth daily.     diphenhydrAMINE  (BENADRYL ) 25 mg capsule Take 25 mg by mouth every 6 (six) hours as needed for itching.     docusate sodium  100 MG CAPS Take 100 mg by mouth daily. (Patient taking differently: Take 100 mg by mouth daily as needed (constipation).) 10 capsule 0   famotidine  (PEPCID ) 20 MG tablet Take 20 mg by  mouth 2 (two) times daily.     gabapentin  (NEURONTIN ) 100 MG capsule      methocarbamol  (ROBAXIN ) 500 MG tablet Take 500 mg by mouth daily.     oxyCODONE  (OXY IR/ROXICODONE ) 5 MG immediate release tablet Take 1 tablet (5 mg total) by mouth every 6 (six) hours as needed for severe pain (pain score 7-10). 15 tablet 0   potassium chloride  (K-DUR) 10 MEQ tablet Take 2 tablets (20 mEq total) by mouth daily. 6 tablet 0   pravastatin (PRAVACHOL) 80 MG tablet Take 80 mg by mouth daily.      Results for orders placed or performed during the hospital encounter of 02/22/24 (from the past 48 hours)  Basic metabolic panel     Status: Abnormal   Collection Time: 02/22/24  9:08 PM  Result Value Ref Range   Sodium 139 135 - 145 mmol/L   Potassium 4.4 3.5 - 5.1 mmol/L   Chloride 103 98 - 111 mmol/L   CO2 23 22 - 32 mmol/L   Glucose, Bld 125 (H) 70 - 99 mg/dL    Comment: Glucose reference range applies only to samples taken after fasting for at least 8 hours.   BUN 30 (H) 8 - 23 mg/dL   Creatinine, Ser 8.84 (H) 0.44 - 1.00 mg/dL   Calcium 9.9 8.9 - 89.6 mg/dL   GFR, Estimated 50 (L) >60 mL/min    Comment: (NOTE) Calculated using the CKD-EPI Creatinine Equation (2021)    Anion gap 14 5 - 15    Comment: Performed at [facility], [address] [place], [place] [address]  CBC with Differential     Status: Abnormal   Collection Time: 02/22/24  9:08 PM  Result Value Ref Range   WBC 6.4 4.0 - 10.5 K/uL   RBC 3.77 (L) 3.87 - 5.11 MIL/uL   Hemoglobin 11.8 (L) 12.0 - 15.0 g/dL   HCT 61.7 63.9 - 53.9 %   MCV 101.3 (H) 80.0 - 100.0 fL   MCH 31.3 26.0 - 34.0 pg   MCHC 30.9 30.0 - 36.0 g/dL   RDW 87.4 88.4 - 84.4 %   Platelets 244 150 - 400 K/uL   nRBC 0.0 0.0 - 0.2 %   Neutrophils Relative % 60 %   Neutro Abs 3.9 1.7 - 7.7 K/uL   Lymphocytes Relative 22 %   Lymphs Abs 1.4 0.7 - 4.0 K/uL   Monocytes Relative 12 %   Monocytes Absolute 0.7 0.1 - 1.0 K/uL   Eosinophils Relative 4 %    Eosinophils Absolute 0.3 0.0 - 0.5 K/uL   Basophils Relative 1 %   Basophils Absolute 0.0 0.0 - 0.1 K/uL   Immature Granulocytes 1 %  Abs Immature Granulocytes 0.03 0.00 - 0.07 K/uL    Comment: Performed at [facility], [address] [place], [place] [address]  Basic metabolic panel     Status: Abnormal   Collection Time: 02/23/24  5:20 AM  Result Value Ref Range   Sodium 139 135 - 145 mmol/L   Potassium 4.8 3.5 - 5.1 mmol/L   Chloride 106 98 - 111 mmol/L   CO2 21 (L) 22 - 32 mmol/L   Glucose, Bld 141 (H) 70 - 99 mg/dL    Comment: Glucose reference range applies only to samples taken after fasting for at least 8 hours.   BUN 32 (H) 8 - 23 mg/dL   Creatinine, Ser 8.78 (H) 0.44 - 1.00 mg/dL   Calcium 9.4 8.9 - 89.6 mg/dL   GFR, Estimated 47 (L) >60 mL/min    Comment: (NOTE) Calculated using the CKD-EPI Creatinine Equation (2021)    Anion gap 11 5 - 15    Comment: Performed at [facility], [address] [place], [place] [address]  CBC     Status: Abnormal   Collection Time: 02/23/24  5:20 AM  Result Value Ref Range   WBC 9.7 4.0 - 10.5 K/uL   RBC 3.03 (L) 3.87 - 5.11 MIL/uL   Hemoglobin 9.8 (L) 12.0 - 15.0 g/dL   HCT 69.0 (L) 63.9 - 53.9 %   MCV 102.0 (H) 80.0 - 100.0 fL   MCH 32.3 26.0 - 34.0 pg   MCHC 31.7 30.0 - 36.0 g/dL   RDW 87.3 88.4 - 84.4 %   Platelets 228 150 - 400 K/uL   nRBC 0.0 0.0 - 0.2 %    Comment: Performed at [facility], [address] [place], [place] [address]   No results found.  Review of Systems  Constitutional: Negative.   HENT: Negative.    Respiratory: Negative.    Cardiovascular:  Positive for chest pain (right breast pain).  Gastrointestinal: Negative.   Endocrine: Negative.   Genitourinary: Negative.   Musculoskeletal: Negative.   Skin:        Hematoma right breast  Allergic/Immunologic: Negative.   Neurological: Negative.   Hematological: Negative.    Psychiatric/Behavioral: Negative.    All other systems reviewed and are negative.   Blood pressure 113/81, pulse 87, temperature 97.6 F (36.4 C), temperature source Oral, resp. rate 18, height 5' (1.524 m), weight 79.7 kg, SpO2 100%. Physical Exam Vitals reviewed.  Constitutional:      General: She is not in acute distress (looks uncomfortable).    Appearance: Normal appearance. She is not ill-appearing, toxic-appearing or diaphoretic.  HENT:     Head: Normocephalic and atraumatic.     Right Ear: External ear normal.     Left Ear: External ear normal.     Nose: No congestion or rhinorrhea.     Mouth/Throat:     Mouth: Mucous membranes are moist.  Eyes:     General: No scleral icterus.    Extraocular Movements: Extraocular movements intact.     Conjunctiva/sclera: Conjunctivae normal.     Pupils: Pupils are equal, round, and reactive to light.  Cardiovascular:     Rate and Rhythm: Normal rate and regular rhythm.     Pulses: Normal pulses.     Heart sounds: Normal heart sounds.  Pulmonary:     Effort: Pulmonary effort is normal. No respiratory distress.     Breath sounds: Normal breath sounds.  Chest:  Chest wall: Tenderness (right breast tenderness) present.  Abdominal:     General: Abdomen is flat. There is no distension.     Palpations: Abdomen is soft.  Musculoskeletal:        General: No swelling.     Cervical back: Normal range of motion and neck supple.  Skin:    General: Skin is warm and dry.     Capillary Refill: Capillary refill takes 2 to 3 seconds.     Findings: Bruising (right chest) present.  Neurological:     General: No focal deficit present.     Mental Status: She is alert. She is disoriented.     Cranial Nerves: No cranial nerve deficit.     Sensory: No sensory deficit.     Motor: No weakness.     Coordination: Coordination normal.      Assessment/Plan Right breast cancer Right breast hematoma s/p right seed bracketed lumpectomy 10/28.    ABL anemia  Ice  Pain control Continue drain. Switch out bulb given large clot in bulb.  Will communicate with Dr. Vanderbilt.   IV fluids for slight bump in creatinine.   Jina LITTIE Nephew, MD, FACS, FSSO Surgical Oncology, General Surgery, Trauma and Critical [facility] Surgery, [place] [phone] for weekday/non holidays Check [email or web address] for coverage night/weekend/holidays under General Surgery

## 2024-02-22 NOTE — ED Notes (Signed)
 JP drain emptied x 2. 150 cc total. Unable to fully empty d/t large clot in drain.

## 2024-02-22 NOTE — ED Provider Notes (Signed)
 [place] EMERGENCY DEPARTMENT AT [facility] Provider Note   CSN: [id number] Arrival date & time: 02/22/24  2052     Patient presents with: Post-op Problem   Sandy Morales is a 73 y.o. female.   Pt is a 73 yo female with pmhx significant for htn, gerd, hld, arthritis, and breast cancer.  Pt had a right breast lumpectomy done by Dr. Vanderbilt on 10/28.  She has had very little drainage into her JP drain and was hoping to get it out soon.  She felt a pain in her right breast and then noticed a lot of blood into the drain.         Prior to Admission medications   Medication Sig Start Date End Date Taking? Authorizing Provider  allopurinol  (ZYLOPRIM ) 100 MG tablet Take 100 mg by mouth daily.    [provider]  benazepril -hydrochlorthiazide (LOTENSIN  HCT) 10-12.5 MG tablet Take 1 tablet by mouth daily.    [provider]  Calcium Carbonate (CALCIUM 600 PO) Take 600 mg by mouth daily.    [provider]  Cholecalciferol (VITAMIN D -3) 1000 UNITS CAPS Take 1 capsule by mouth daily.    [provider]  diphenhydrAMINE  (BENADRYL ) 25 mg capsule Take 25 mg by mouth every 6 (six) hours as needed for itching.    [provider]  docusate sodium  100 MG CAPS Take 100 mg by mouth daily. Patient taking differently: Take 100 mg by mouth daily as needed (constipation). 12/18/12   Leora Lenis, MD  famotidine  (PEPCID ) 20 MG tablet Take 20 mg by mouth 2 (two) times daily.    [provider]  gabapentin  (NEURONTIN ) 100 MG capsule  01/18/24   [provider]  methocarbamol  (ROBAXIN ) 500 MG tablet Take 500 mg by mouth daily. 01/28/16   [provider]  oxyCODONE  (OXY IR/ROXICODONE ) 5 MG immediate release tablet Take 1 tablet (5 mg total) by mouth every 6 (six) hours as needed for severe pain (pain score 7-10). 02/14/24   Cornett, Debby, MD  potassium chloride  (K-DUR) 10 MEQ tablet Take 2 tablets (20 mEq total) by mouth  daily. 05/27/14 02/07/24  Elgergawy, Brayton RAMAN, MD  pravastatin (PRAVACHOL) 80 MG tablet Take 80 mg by mouth daily.    [provider]    Allergies: Patient has no known allergies.    Review of Systems  Constitutional:        Right breast pain  All other systems reviewed and are negative.   Updated Vital Signs BP (!) 150/105 (BP Location: Right Arm)   Pulse (!) 103   Temp 98.1 F (36.7 C) (Oral)   Resp 17   Ht 5' (1.524 m)   Wt 79.7 kg   SpO2 99%   BMI 34.32 kg/m   Physical Exam Vitals and nursing note reviewed.  Constitutional:      Appearance: Normal appearance.  HENT:     Head: Normocephalic and atraumatic.     Right Ear: External ear normal.     Left Ear: External ear normal.     Nose: Nose normal.     Mouth/Throat:     Mouth: Mucous membranes are moist.     Pharynx: Oropharynx is clear.  Eyes:     Extraocular Movements: Extraocular movements intact.     Conjunctiva/sclera: Conjunctivae normal.     Pupils: Pupils are equal, round, and reactive to light.  Cardiovascular:     Rate and Rhythm: Normal rate and regular rhythm.  Pulses: Normal pulses.     Heart sounds: Normal heart sounds.  Pulmonary:     Effort: Pulmonary effort is normal.     Breath sounds: Normal breath sounds.  Chest:     Comments: Right breast swelling and pain; Jp drain with 50 cc blood Abdominal:     General: Abdomen is flat. Bowel sounds are normal.     Palpations: Abdomen is soft.  Musculoskeletal:        General: Normal range of motion.     Cervical back: Normal range of motion and neck supple.  Skin:    General: Skin is warm.     Capillary Refill: Capillary refill takes less than 2 seconds.  Neurological:     General: No focal deficit present.     Mental Status: She is alert and oriented to person, place, and time.  Psychiatric:        Mood and Affect: Mood normal.        Behavior: Behavior normal.          (all labs ordered are listed, but only abnormal  results are displayed) Labs Reviewed  BASIC METABOLIC PANEL WITH GFR - Abnormal; Notable for the following components:      Result Value   Glucose, Bld 125 (*)    BUN 30 (*)    Creatinine, Ser 1.15 (*)    GFR, Estimated 50 (*)    All other components within normal limits  CBC WITH DIFFERENTIAL/PLATELET - Abnormal; Notable for the following components:   RBC 3.77 (*)    Hemoglobin 11.8 (*)    MCV 101.3 (*)    All other components within normal limits    EKG: None  Radiology: No results found.   Procedures   Medications Ordered in the ED  morphine  (PF) 4 MG/ML injection 4 mg (4 mg Intravenous Given 02/22/24 2153)  ondansetron  (ZOFRAN ) injection 4 mg (4 mg Intravenous Given 02/22/24 2154)  morphine  (PF) 4 MG/ML injection 4 mg (4 mg Intravenous Given 02/22/24 2326)                                    Medical Decision Making Amount and/or Complexity of Data Reviewed Labs: ordered.  Risk Prescription drug management.   This patient presents to the ED for concern of breast pain, this involves an extensive number of treatment options, and is a complaint that carries with it a high risk of complications and morbidity.  The differential diagnosis includes breast hematoma, infection   Co morbidities that complicate the patient evaluation  htn, gerd, hld, arthritis, and breast cancer.   Additional history obtained:  Additional history obtained from epic chart review External records from outside source obtained and reviewed including EMS report   Lab Tests:  I Ordered, and personally interpreted labs.  The pertinent results include:  cbc with hgb 11.8; bmp with bun elevated at 30, cr 1.15  Medicines ordered and prescription drug management:  I ordered medication including morphine /zofran   for sx  Reevaluation of the patient after these medicines showed that the patient improved I have reviewed the patients home medicines and have made adjustments as  needed   Consultations Obtained:  I requested consultation with the surgeon (Dr. Aron),  and discussed lab and imaging findings as well as pertinent plan - she will admit for obs   Problem List / ED Course:  Breast hematoma: surgery will observe overnight.  Reevaluation:  After the interventions noted above, I reevaluated the patient and found that they have :improved   Social Determinants of Health:  Lives at home   Dispostion:  After consideration of the diagnostic results and the patients response to treatment, I feel that the patent would benefit from admission.       Final diagnoses:  Breast hematoma    ED Discharge Orders     None          Dean Clarity, MD 02/22/24 2327

## 2024-02-22 NOTE — ED Triage Notes (Signed)
 Pt BIB [facility] from home for post-op problem. Pt reports bleeding into jp drain. EMS reports about 100 cc. Pt unsure what surgery she had, possible a mass removal from breast. Hx breast ca.   170/137 HR 111 96 RA T 98.4

## 2024-02-23 ENCOUNTER — Observation Stay (HOSPITAL_COMMUNITY)

## 2024-02-23 ENCOUNTER — Encounter (HOSPITAL_COMMUNITY): Payer: Self-pay

## 2024-02-23 ENCOUNTER — Encounter (HOSPITAL_COMMUNITY): Payer: Self-pay | Admitting: Surgery

## 2024-02-23 ENCOUNTER — Encounter (HOSPITAL_COMMUNITY)
Admission: EM | Disposition: A | Discharge status: Home / Self Care | Payer: Self-pay | Source: Home / Self Care | Attending: Surgery

## 2024-02-23 DIAGNOSIS — E785 Hyperlipidemia, unspecified: Secondary | ICD-10-CM | POA: Diagnosis not present

## 2024-02-23 DIAGNOSIS — K219 Gastro-esophageal reflux disease without esophagitis: Secondary | ICD-10-CM | POA: Diagnosis not present

## 2024-02-23 DIAGNOSIS — I10 Essential (primary) hypertension: Secondary | ICD-10-CM | POA: Diagnosis not present

## 2024-02-23 DIAGNOSIS — D62 Acute posthemorrhagic anemia: Secondary | ICD-10-CM | POA: Diagnosis not present

## 2024-02-23 DIAGNOSIS — Z808 Family history of malignant neoplasm of other organs or systems: Secondary | ICD-10-CM | POA: Diagnosis not present

## 2024-02-23 DIAGNOSIS — C50911 Malignant neoplasm of unspecified site of right female breast: Secondary | ICD-10-CM | POA: Diagnosis not present

## 2024-02-23 DIAGNOSIS — M109 Gout, unspecified: Secondary | ICD-10-CM | POA: Diagnosis not present

## 2024-02-23 DIAGNOSIS — L7632 Postprocedural hematoma of skin and subcutaneous tissue following other procedure: Secondary | ICD-10-CM | POA: Diagnosis not present

## 2024-02-23 DIAGNOSIS — Z90710 Acquired absence of both cervix and uterus: Secondary | ICD-10-CM | POA: Diagnosis not present

## 2024-02-23 DIAGNOSIS — Z79899 Other long term (current) drug therapy: Secondary | ICD-10-CM | POA: Diagnosis not present

## 2024-02-23 DIAGNOSIS — N6489 Other specified disorders of breast: Secondary | ICD-10-CM | POA: Diagnosis present

## 2024-02-23 DIAGNOSIS — Z9012 Acquired absence of left breast and nipple: Secondary | ICD-10-CM | POA: Diagnosis not present

## 2024-02-23 HISTORY — PX: HEMATOMA EVACUATION: SHX5118

## 2024-02-23 LAB — CBC
HCT: 30.9 % — ABNORMAL LOW (ref 36.0–46.0)
Hemoglobin: 9.8 g/dL — ABNORMAL LOW (ref 12.0–15.0)
MCH: 32.3 pg (ref 26.0–34.0)
MCHC: 31.7 g/dL (ref 30.0–36.0)
MCV: 102 fL — ABNORMAL HIGH (ref 80.0–100.0)
Platelets: 228 K/uL (ref 150–400)
RBC: 3.03 MIL/uL — ABNORMAL LOW (ref 3.87–5.11)
RDW: 12.6 % (ref 11.5–15.5)
WBC: 9.7 K/uL (ref 4.0–10.5)
nRBC: 0 % (ref 0.0–0.2)

## 2024-02-23 LAB — BASIC METABOLIC PANEL WITH GFR
Anion gap: 11 (ref 5–15)
BUN: 32 mg/dL — ABNORMAL HIGH (ref 8–23)
CO2: 21 mmol/L — ABNORMAL LOW (ref 22–32)
Calcium: 9.4 mg/dL (ref 8.9–10.3)
Chloride: 106 mmol/L (ref 98–111)
Creatinine, Ser: 1.21 mg/dL — ABNORMAL HIGH (ref 0.44–1.00)
GFR, Estimated: 47 mL/min — ABNORMAL LOW (ref >60)
Glucose, Bld: 141 mg/dL — ABNORMAL HIGH (ref 70–99)
Potassium: 4.8 mmol/L (ref 3.5–5.1)
Sodium: 139 mmol/L (ref 135–145)

## 2024-02-23 SURGERY — EVACUATION HEMATOMA
Anesthesia: General | Laterality: Right

## 2024-02-23 MED ORDER — METHOCARBAMOL 500 MG PO TABS: 500.0000 mg | ORAL_TABLET | Freq: Four times a day (QID) | ORAL | Status: DC | PRN

## 2024-02-23 MED ORDER — PROCHLORPERAZINE MALEATE 10 MG PO TABS: 10.0000 mg | ORAL_TABLET | Freq: Four times a day (QID) | ORAL | Status: DC | PRN

## 2024-02-23 MED ORDER — DIPHENHYDRAMINE HCL 12.5 MG/5ML PO ELIX
12.5000 mg | ORAL_SOLUTION | Freq: Four times a day (QID) | ORAL | Status: DC | PRN
Start: 2024-02-23 — End: 2024-02-24

## 2024-02-23 MED ORDER — MELATONIN 3 MG PO TABS: 3.0000 mg | ORAL_TABLET | Freq: Every evening | ORAL | Status: DC | PRN

## 2024-02-23 MED ORDER — ACETAMINOPHEN 10 MG/ML IV SOLN: 1000.0000 mg | Freq: Once | INTRAVENOUS | Status: DC | PRN

## 2024-02-23 MED ORDER — CALCIUM CARBONATE 600 MG PO TABS: 600.0000 mg | ORAL_TABLET | Freq: Every day | ORAL | Status: DC

## 2024-02-23 MED ORDER — FENTANYL CITRATE (PF) 100 MCG/2ML IJ SOLN
INTRAMUSCULAR | Status: AC
  Filled 2024-02-23: qty 2

## 2024-02-23 MED ORDER — CHLORHEXIDINE GLUCONATE 0.12 % MT SOLN
OROMUCOSAL | Status: AC
  Administered 2024-02-23: 15 mL via OROMUCOSAL
  Filled 2024-02-23: qty 15

## 2024-02-23 MED ORDER — FENTANYL CITRATE (PF) 250 MCG/5ML IJ SOLN
INTRAMUSCULAR | Status: DC | PRN
  Administered 2024-02-23 (×4): 25 ug via INTRAVENOUS

## 2024-02-23 MED ORDER — BENAZEPRIL HCL 5 MG PO TABS
10.0000 mg | ORAL_TABLET | Freq: Every day | ORAL | Status: DC
Start: 2024-02-23 — End: 2024-02-24
  Administered 2024-02-23: 10 mg via ORAL
  Filled 2024-02-23 (×2): qty 2

## 2024-02-23 MED ORDER — PHENYLEPHRINE 80 MCG/ML (10ML) SYRINGE FOR IV PUSH (FOR BLOOD PRESSURE SUPPORT)
PREFILLED_SYRINGE | INTRAVENOUS | Status: DC | PRN
  Administered 2024-02-23 (×2): 80 ug via INTRAVENOUS

## 2024-02-23 MED ORDER — LIDOCAINE 2% (20 MG/ML) 5 ML SYRINGE
INTRAMUSCULAR | Status: AC
Start: 2024-02-23 — End: 2024-02-23
  Filled 2024-02-23: qty 5

## 2024-02-23 MED ORDER — ONDANSETRON HCL 4 MG/2ML IJ SOLN: 4.0000 mg | Freq: Once | INTRAMUSCULAR | Status: DC | PRN

## 2024-02-23 MED ORDER — DOCUSATE SODIUM 100 MG PO CAPS: 100.0000 mg | ORAL_CAPSULE | Freq: Every day | ORAL | Status: DC | PRN

## 2024-02-23 MED ORDER — ONDANSETRON 4 MG PO TBDP: 4.0000 mg | ORAL_TABLET | Freq: Four times a day (QID) | ORAL | Status: DC | PRN

## 2024-02-23 MED ORDER — FAMOTIDINE 20 MG PO TABS
20.0000 mg | ORAL_TABLET | Freq: Two times a day (BID) | ORAL | Status: DC
  Administered 2024-02-23 (×3): 20 mg via ORAL
  Filled 2024-02-23 (×3): qty 1

## 2024-02-23 MED ORDER — PHENYLEPHRINE HCL-NACL 20-0.9 MG/250ML-% IV SOLN
INTRAVENOUS | Status: DC | PRN
  Administered 2024-02-23: 40 ug/min via INTRAVENOUS

## 2024-02-23 MED ORDER — PROPOFOL 10 MG/ML IV BOLUS
INTRAVENOUS | Status: DC | PRN
  Administered 2024-02-23: 30 mg via INTRAVENOUS
  Administered 2024-02-23: 20 mg via INTRAVENOUS
  Administered 2024-02-23: 100 mg via INTRAVENOUS

## 2024-02-23 MED ORDER — ORAL CARE MOUTH RINSE: 15.0000 mL | Freq: Once | OROMUCOSAL | Status: AC

## 2024-02-23 MED ORDER — CALCIUM CARBONATE 1250 (500 CA) MG PO TABS
1.0000 | ORAL_TABLET | Freq: Every day | ORAL | Status: DC
  Administered 2024-02-23 – 2024-02-24 (×2): 1250 mg via ORAL
  Filled 2024-02-23 (×2): qty 1

## 2024-02-23 MED ORDER — DEXAMETHASONE SOD PHOSPHATE PF 10 MG/ML IJ SOLN
INTRAMUSCULAR | Status: DC | PRN
  Administered 2024-02-23: 5 mg via INTRAVENOUS

## 2024-02-23 MED ORDER — 0.9 % SODIUM CHLORIDE (POUR BTL) OPTIME
TOPICAL | Status: DC | PRN
  Administered 2024-02-23 (×2): 1000 mL

## 2024-02-23 MED ORDER — PRAVASTATIN SODIUM 40 MG PO TABS: 80.0000 mg | ORAL_TABLET | Freq: Every day | ORAL | Status: DC

## 2024-02-23 MED ORDER — LACTATED RINGERS IV SOLN: INTRAVENOUS | Status: DC

## 2024-02-23 MED ORDER — VITAMIN D 25 MCG (1000 UNIT) PO TABS
1000.0000 [IU] | ORAL_TABLET | Freq: Every day | ORAL | Status: DC
  Administered 2024-02-23: 1000 [IU] via ORAL
  Filled 2024-02-23: qty 1

## 2024-02-23 MED ORDER — OXYCODONE HCL 5 MG PO TABS: 5.0000 mg | ORAL_TABLET | Freq: Once | ORAL | Status: DC | PRN

## 2024-02-23 MED ORDER — CEFAZOLIN SODIUM-DEXTROSE 2-3 GM-%(50ML) IV SOLR
INTRAVENOUS | Status: DC | PRN
  Administered 2024-02-23: 2 g via INTRAVENOUS

## 2024-02-23 MED ORDER — DIPHENHYDRAMINE HCL 25 MG PO CAPS
25.0000 mg | ORAL_CAPSULE | Freq: Four times a day (QID) | ORAL | Status: DC | PRN
Start: 2024-02-23 — End: 2024-02-24

## 2024-02-23 MED ORDER — EPHEDRINE 5 MG/ML INJ
INTRAVENOUS | Status: AC
  Filled 2024-02-23: qty 5

## 2024-02-23 MED ORDER — ONDANSETRON HCL 4 MG/2ML IJ SOLN: 4.0000 mg | Freq: Four times a day (QID) | INTRAMUSCULAR | Status: DC | PRN

## 2024-02-23 MED ORDER — SODIUM CHLORIDE 0.9 % IV SOLN
INTRAVENOUS | Status: DC
  Filled 2024-02-23: qty 10

## 2024-02-23 MED ORDER — HYDROCHLOROTHIAZIDE 12.5 MG PO TABS: 12.5000 mg | ORAL_TABLET | Freq: Every day | ORAL | Status: DC

## 2024-02-23 MED ORDER — LIDOCAINE 2% (20 MG/ML) 5 ML SYRINGE
INTRAMUSCULAR | Status: DC | PRN
  Administered 2024-02-23: 100 mg via INTRAVENOUS

## 2024-02-23 MED ORDER — PHENYLEPHRINE 80 MCG/ML (10ML) SYRINGE FOR IV PUSH (FOR BLOOD PRESSURE SUPPORT)
PREFILLED_SYRINGE | INTRAVENOUS | Status: AC
Start: 2024-02-23 — End: 2024-02-23
  Filled 2024-02-23: qty 10

## 2024-02-23 MED ORDER — OXYCODONE HCL 5 MG/5ML PO SOLN: 5.0000 mg | Freq: Once | ORAL | Status: DC | PRN

## 2024-02-23 MED ORDER — SODIUM CHLORIDE 0.9 % IV SOLN
INTRAVENOUS | Status: DC | PRN
  Administered 2024-02-23: 500 mL

## 2024-02-23 MED ORDER — DIPHENHYDRAMINE HCL 50 MG/ML IJ SOLN: 12.5000 mg | Freq: Four times a day (QID) | INTRAMUSCULAR | Status: DC | PRN

## 2024-02-23 MED ORDER — OXYCODONE HCL 5 MG PO TABS
5.0000 mg | ORAL_TABLET | Freq: Four times a day (QID) | ORAL | Status: DC | PRN
  Administered 2024-02-23 (×2): 10 mg via ORAL
  Filled 2024-02-23 (×2): qty 2

## 2024-02-23 MED ORDER — ACETAMINOPHEN 500 MG PO TABS
1000.0000 mg | ORAL_TABLET | Freq: Four times a day (QID) | ORAL | Status: DC
  Administered 2024-02-23 – 2024-02-24 (×5): 1000 mg via ORAL
  Filled 2024-02-23 (×5): qty 2

## 2024-02-23 MED ORDER — MORPHINE SULFATE (PF) 2 MG/ML IV SOLN
1.0000 mg | INTRAVENOUS | Status: DC | PRN
  Administered 2024-02-23: 2 mg via INTRAVENOUS
  Filled 2024-02-23: qty 1

## 2024-02-23 MED ORDER — STERILE WATER FOR IRRIGATION IR SOLN
Status: DC | PRN
Start: 2024-02-23 — End: 2024-02-23
  Administered 2024-02-23: 1000 mL

## 2024-02-23 MED ORDER — SENNA 8.6 MG PO TABS
1.0000 | ORAL_TABLET | Freq: Two times a day (BID) | ORAL | Status: DC
  Administered 2024-02-23 (×3): 8.6 mg via ORAL
  Filled 2024-02-23 (×3): qty 1

## 2024-02-23 MED ORDER — ONDANSETRON HCL 4 MG/2ML IJ SOLN
INTRAMUSCULAR | Status: DC | PRN
Start: 2024-02-23 — End: 2024-02-23
  Administered 2024-02-23: 4 mg via INTRAVENOUS

## 2024-02-23 MED ORDER — GABAPENTIN 100 MG PO CAPS
100.0000 mg | ORAL_CAPSULE | Freq: Two times a day (BID) | ORAL | Status: DC
  Administered 2024-02-23 (×3): 100 mg via ORAL
  Filled 2024-02-23 (×3): qty 1

## 2024-02-23 MED ORDER — HEMOSTATIC AGENTS (NO CHARGE) OPTIME
TOPICAL | Status: DC | PRN
  Administered 2024-02-23 (×3): 1 via TOPICAL

## 2024-02-23 MED ORDER — ALLOPURINOL 100 MG PO TABS
100.0000 mg | ORAL_TABLET | Freq: Every day | ORAL | Status: DC
  Administered 2024-02-23: 100 mg via ORAL
  Filled 2024-02-23: qty 1

## 2024-02-23 MED ORDER — ONDANSETRON HCL 4 MG/2ML IJ SOLN
INTRAMUSCULAR | Status: AC
  Filled 2024-02-23: qty 2

## 2024-02-23 MED ORDER — FENTANYL CITRATE (PF) 100 MCG/2ML IJ SOLN
25.0000 ug | INTRAMUSCULAR | Status: DC | PRN
  Administered 2024-02-23: 25 ug via INTRAVENOUS
  Administered 2024-02-23: 50 ug via INTRAVENOUS
  Administered 2024-02-23: 25 ug via INTRAVENOUS

## 2024-02-23 MED ORDER — SODIUM CHLORIDE 0.9 % IV SOLN: INTRAVENOUS | Status: DC

## 2024-02-23 MED ORDER — CEFAZOLIN SODIUM 1 G IJ SOLR
INTRAMUSCULAR | Status: AC
  Filled 2024-02-23: qty 20

## 2024-02-23 MED ORDER — PROPOFOL 10 MG/ML IV BOLUS
INTRAVENOUS | Status: AC
Start: 2024-02-23 — End: 2024-02-23
  Filled 2024-02-23: qty 20

## 2024-02-23 MED ORDER — BENAZEPRIL HCL 5 MG PO TABS
10.0000 mg | ORAL_TABLET | Freq: Every day | ORAL | Status: DC
  Filled 2024-02-23 (×2): qty 2

## 2024-02-23 MED ORDER — POTASSIUM CHLORIDE ER 10 MEQ PO TBCR: 20.0000 meq | EXTENDED_RELEASE_TABLET | Freq: Every day | ORAL | Status: DC

## 2024-02-23 MED ORDER — CHLORHEXIDINE GLUCONATE 0.12 % MT SOLN: 15.0000 mL | Freq: Once | OROMUCOSAL | Status: AC

## 2024-02-23 MED ORDER — TRANEXAMIC ACID 1000 MG/10ML IV SOLN
3000.0000 mg | Freq: Once | INTRAVENOUS | Status: AC
  Administered 2024-02-23: 3000 mg via TOPICAL
  Filled 2024-02-23 (×2): qty 30

## 2024-02-23 MED ORDER — BUPIVACAINE-EPINEPHRINE (PF) 0.25% -1:200000 IJ SOLN
INTRAMUSCULAR | Status: AC
  Filled 2024-02-23: qty 30

## 2024-02-23 MED ORDER — SODIUM CHLORIDE 0.9 % IV SOLN
Freq: Once | INTRAVENOUS | Status: DC
  Filled 2024-02-23 (×2): qty 10

## 2024-02-23 MED ORDER — METHOCARBAMOL 500 MG PO TABS: 500.0000 mg | ORAL_TABLET | Freq: Every day | ORAL | Status: DC

## 2024-02-23 MED ORDER — BUPIVACAINE-EPINEPHRINE (PF) 0.25% -1:200000 IJ SOLN
INTRAMUSCULAR | Status: DC | PRN
  Administered 2024-02-23: 10 mL

## 2024-02-23 MED ORDER — BENAZEPRIL-HYDROCHLOROTHIAZIDE 10-12.5 MG PO TABS: 1.0000 | ORAL_TABLET | Freq: Every day | ORAL | Status: DC

## 2024-02-23 MED ORDER — PROCHLORPERAZINE EDISYLATE 10 MG/2ML IJ SOLN: 5.0000 mg | Freq: Four times a day (QID) | INTRAMUSCULAR | Status: DC | PRN

## 2024-02-23 MED ORDER — DEXTROSE-SODIUM CHLORIDE 5-0.45 % IV SOLN: INTRAVENOUS | Status: AC

## 2024-02-23 MED ORDER — ALBUMIN HUMAN 5 % IV SOLN: INTRAVENOUS | Status: DC | PRN

## 2024-02-23 MED ORDER — PHENYLEPHRINE HCL-NACL 20-0.9 MG/250ML-% IV SOLN: INTRAVENOUS | Status: DC | PRN

## 2024-02-23 SURGICAL SUPPLY — 34 items
BAG COUNTER SPONGE SURGICOUNT (BAG) ×1 IMPLANT
BINDER BREAST LRG (GAUZE/BANDAGES/DRESSINGS) IMPLANT
BINDER BREAST XLRG (GAUZE/BANDAGES/DRESSINGS) IMPLANT
CANISTER SUCTION 3000ML PPV (SUCTIONS) IMPLANT
CHLORAPREP W/TINT 26 (MISCELLANEOUS) ×1 IMPLANT
CLIP APPLIE 9.375 MED OPEN (MISCELLANEOUS) IMPLANT
CNTNR URN SCR LID CUP LEK RST (MISCELLANEOUS) IMPLANT
COVER SURGICAL LIGHT HANDLE (MISCELLANEOUS) ×1 IMPLANT
DERMABOND ADVANCED .7 DNX12 (GAUZE/BANDAGES/DRESSINGS) ×1 IMPLANT
DRAIN CHANNEL 19F RND (DRAIN) IMPLANT
DRAPE CHEST BREAST 15X10 FENES (DRAPES) ×1 IMPLANT
ELECT BLADE 6.5 EXT (BLADE) IMPLANT
ELECT CAUTERY BLADE 6.4 (BLADE) ×1 IMPLANT
ELECTRODE REM PT RTRN 9FT ADLT (ELECTROSURGICAL) ×1 IMPLANT
EVACUATOR SILICONE 100CC (DRAIN) IMPLANT
GAUZE PAD ABD 8X10 STRL (GAUZE/BANDAGES/DRESSINGS) ×1 IMPLANT
GAUZE SPONGE 4X4 12PLY STRL (GAUZE/BANDAGES/DRESSINGS) IMPLANT
GLOVE BIO SURGEON STRL SZ8 (GLOVE) ×1 IMPLANT
GLOVE BIOGEL PI IND STRL 8 (GLOVE) ×1 IMPLANT
GOWN STRL REUS W/ TWL LRG LVL3 (GOWN DISPOSABLE) ×1 IMPLANT
GOWN STRL REUS W/ TWL XL LVL3 (GOWN DISPOSABLE) ×1 IMPLANT
IV STERILE WATER 1000 ML (IV SOLUTION) IMPLANT
KIT BASIN OR (CUSTOM PROCEDURE TRAY) ×1 IMPLANT
NDL HYPO 25GX1X1/2 BEV (NEEDLE) ×1 IMPLANT
NEEDLE HYPO 25GX1X1/2 BEV (NEEDLE) ×1 IMPLANT
PACK GENERAL/GYN (CUSTOM PROCEDURE TRAY) ×1 IMPLANT
PAD ABD 8X10 STRL (GAUZE/BANDAGES/DRESSINGS) IMPLANT
POWDER SURGICEL 3.0 GRAM (HEMOSTASIS) IMPLANT
SOLN 0.9% NACL POUR BTL 1000ML (IV SOLUTION) IMPLANT
SUT ETHILON 2 0 FS 18 (SUTURE) IMPLANT
SUT MNCRL AB 4-0 PS2 18 (SUTURE) ×1 IMPLANT
SUT VIC AB 3-0 SH 8-18 (SUTURE) ×1 IMPLANT
SYR CONTROL 10ML LL (SYRINGE) IMPLANT
TOWEL GREEN STERILE FF (TOWEL DISPOSABLE) IMPLANT

## 2024-02-23 NOTE — Transfer of Care (Signed)
 Immediate Anesthesia Transfer of Care Note  Patient: Sandy Morales  Procedure(s) Performed: EVACUATION HEMATOMA (Right)  Patient Location: PACU  Anesthesia Type:General  Level of Consciousness: awake, alert , and oriented  Airway & Oxygen Therapy: Patient Spontanous Breathing and Patient connected to face mask oxygen  Post-op Assessment: Report given to RN and Post -op Vital signs reviewed and stable  Post vital signs: Reviewed and stable  Last Vitals:  Vitals Value Taken Time  BP 157/84 02/23/24 17:07  Temp 36.4 C 02/23/24 17:07  Pulse 82 02/23/24 17:09  Resp 20 02/23/24 17:09  SpO2 97 % 02/23/24 17:09  Vitals shown include unfiled device data.  Last Pain:  Vitals:   02/23/24 1309  TempSrc: Oral  PainSc:       Patients Stated Pain Goal: 3 (02/23/24 0600)  Complications: No notable events documented.

## 2024-02-23 NOTE — Anesthesia Procedure Notes (Signed)
 Procedure Name: LMA Insertion Date/Time: 02/23/2024 3:40 PM  Performed by: Delores Dus, CRNAPre-anesthesia Checklist: Patient identified, Emergency Drugs available, Suction available, Patient being monitored and Timeout performed Patient Re-evaluated:Patient Re-evaluated prior to induction Oxygen Delivery Method: Circle system utilized Preoxygenation: Pre-oxygenation with 100% oxygen Induction Type: IV induction LMA Size: 4.0 Number of attempts: 1

## 2024-02-23 NOTE — Plan of Care (Signed)

## 2024-02-23 NOTE — Anesthesia Preprocedure Evaluation (Signed)
 Anesthesia Evaluation  Patient identified by MRN, date of birth, ID band Patient awake    Reviewed: Allergy & Precautions, NPO status , Patient's Chart, lab work & pertinent test results  Airway Mallampati: III  TM Distance: >3 FB Neck ROM: Limited    Dental  (+) Dental Advisory Given, Missing, Edentulous Upper, Partial Lower   Pulmonary neg pulmonary ROS   breath sounds clear to auscultation       Cardiovascular hypertension, Pt. on medications  Rhythm:Regular Rate:Normal     Neuro/Psych negative neurological ROS     GI/Hepatic Neg liver ROS,GERD  ,,  Endo/Other  negative endocrine ROS    Renal/GU negative Renal ROS     Musculoskeletal  (+) Arthritis ,    Abdominal  (+) + obese  Peds  Hematology negative hematology ROS (+)   Anesthesia Other Findings   Reproductive/Obstetrics                              Anesthesia Physical Anesthesia Plan  ASA: 2  Anesthesia Plan: General   Post-op Pain Management: Tylenol  PO (pre-op)* and Regional block*   Induction: Intravenous  PONV Risk Score and Plan: 3 and Dexamethasone, Ondansetron  and Treatment may vary due to age or medical condition  Airway Management Planned: LMA  Additional Equipment:   Intra-op Plan:   Post-operative Plan: Extubation in OR  Informed Consent: I have reviewed the patients History and Physical, chart, labs and discussed the procedure including the risks, benefits and alternatives for the proposed anesthesia with the patient or authorized representative who has indicated his/her understanding and acceptance.     Dental advisory given  Plan Discussed with: CRNA  Anesthesia Plan Comments:          Anesthesia Quick Evaluation

## 2024-02-23 NOTE — Progress Notes (Signed)
 Pt's JP drain not emptying properly due to very large clot burden inside of bulb, which is blocking the exit spout of drain.

## 2024-02-23 NOTE — Interval H&P Note (Signed)
 History and Physical Interval Note:  02/23/2024 3:21 PM  Sandy Morales  has presented today for surgery, with the diagnosis of HEMATOMA.  The various methods of treatment have been discussed with the patient and family. After consideration of risks, benefits and other options for treatment, the patient has consented to  Procedure(s) with comments: EVACUATION HEMATOMA (Right) - EVACUATION RIGHT BREAST HEMATOMA as a surgical intervention.  The patient's history has been reviewed, patient examined, no change in status, stable for surgery.  I have reviewed the patient's chart and labs.  Questions were answered to the patient's satisfaction.   Plan to evacuate hematom No further signs of ongoing acute blood loss Continue drain.  Shayleigh Bouldin A Lachandra Dettmann

## 2024-02-23 NOTE — Op Note (Signed)
 Preoperative diagnosis: Status post right breast lumpectomy for breast cancer with large right breast hematoma  Postoperative diagnosis: Same  Procedure: Evacuation of right breast hematoma from the lumpectomy bed  Surgeon: Debby Shipper, MD  Anesthesia: LMA with 0.25% Marcaine  with epinephrine   EBL: 300 cc  Drains: 19 round  Specimen: Anterior skin margin from previous lumpectomy cavity to pathology  Indications for procedure: The patient is a 73 year old female 8 days out from a right breast lumpectomy for breast cancer.  She had the sudden onset of severe right breast pain last night and significant output from a drain placed at her lumpectomy.  She was brought by EMS to emergency room was found to have a large hematoma.  She was evaluated and admitted.  Her hemoglobin dropped from 11.8-9.8.  She had no hypotension though.  Upon examination she had a large right breast hematoma and I felt evacuation appropriate.  She was brought to the op room for evacuation of right breast hematoma.  Risks and benefits of the procedure were reviewed with her family and she agreed to proceed.The procedure has been discussed with the patient.  Alternative therapies have been discussed with the patient.  Operative risks include bleeding,  Infection,  Organ injury,  Nerve injury,  Blood vessel injury,  DVT,  Pulmonary embolism,  Death,  And possible reoperation.  Medical management risks include worsening of present situation.  The success of the procedure is 50 -90 % at treating patients symptoms.  The patient understands and agrees to proceed.     Description of procedure: The patient was met in the holding area.  She was then taken back to the operative room after marking the right breast as the correct site.  She was placed on the operating table.  After induction of general seizure the right breast was prepped and draped in sterile fashion timeout performed.  Her previous drain had significant blood clot in  it.  I cut the suture and removed it.  I then open the incision.  She had a large clot probably about 300 cc of clotted blood.  I was able to irrigate this out and evacuated.  Cautery was used to cauterize the skin flap surface since it is quite raw.  The muscles also was cauterized.  I did find an area at the superior skin flap with a large clot removed, there is a large arterial bleed.  I was able to control this with clips and a 3-0 Vicryl suture.  I suspect this area which was tacked down previously had torn through and this is the cause of her bleeding.  There is a residual suture in the muscle at this site as well.  We then used cautery to control other oozing.  I TXA soaked sponge was placed for 5 minutes.  This was removed and reinspected.  There was good hemostasis.  Surgicel powder was then applied to the entire undersurface of the skin flaps muscle.  There is no further bleeding that I can see.  I placed a fresh drain to the drain site and secured to the skin with 2-0 nylon.  We then closed the skin in layers of 3-0 Vicryl and 4-0 Monocryl.  Dermabond applied.  The drain was placed to bulb suction with good seal.  Breast binder placed.  All counts were found to be correct.  The patient was awoke taken recovery in satisfactory condition.

## 2024-02-24 ENCOUNTER — Encounter (HOSPITAL_COMMUNITY): Payer: Self-pay | Admitting: Surgery

## 2024-02-24 ENCOUNTER — Inpatient Hospital Stay: Admitting: Hematology and Oncology

## 2024-02-24 MED ORDER — OXYCODONE HCL 5 MG PO TABS: 5.0000 mg | ORAL_TABLET | Freq: Four times a day (QID) | ORAL | 0 refills | Status: AC | PRN

## 2024-02-24 NOTE — Discharge Summary (Signed)
 Physician Discharge Summary  Patient ID: Sandy Morales MRN: [id number] DOB/AGE: 74-18-1952 74 y.o.  Admit date: 02/22/2024 Discharge date: 02/24/2024  Admission Diagnoses:hematoma right breast   Discharge Diagnoses:  Principal Problem:   Hematoma of right breast   Discharged Condition: good  Hospital Course: Pt admitted 8 days S/P right lumpectomy for breast cancer due to sudden onset of swelling and pain consistent with  a hematoma. She was taken to the OR for drainage and did well. She was D/C home on POD 1 in good condition.       Treatments: surgery: drainage right breast hematoma   Discharge Exam: Blood pressure 123/64, pulse 73, temperature 98.2 F (36.8 C), temperature source Oral, resp. rate 18, height 5' (1.524 m), weight 79.7 kg, SpO2 99%. General appearance: alert and appears stated age Resp: clear to auscultation bilaterally Cardio: NSR  Incision/Wound: CDI no hematoma drain min serous output   Disposition: Discharge disposition: 01-Home or Self Care       Discharge Instructions     Diet - low sodium heart healthy   Complete by: As directed    Increase activity slowly   Complete by: As directed       Allergies as of 02/24/2024   No Known Allergies      Medication List     TAKE these medications    allopurinol  100 MG tablet Commonly known as: ZYLOPRIM  Take 100 mg by mouth daily.   benazepril  10 MG tablet Commonly known as: LOTENSIN  Take 10 mg by mouth daily.   benazepril -hydrochlorthiazide 10-12.5 MG tablet Commonly known as: LOTENSIN  HCT Take 1 tablet by mouth daily.   CALCIUM 600 PO Take 600 mg by mouth daily.   diphenhydrAMINE  25 mg capsule Commonly known as: BENADRYL  Take 25 mg by mouth every 6 (six) hours as needed for itching.   DSS 100 MG Caps Take 100 mg by mouth daily. What changed:  when to take this reasons to take this   EPINEPHrine  0.3 mg/0.3 mL Soaj injection Commonly known as: EPI-PEN Inject 0.3 mg  into the muscle as needed for anaphylaxis.   famotidine  20 MG tablet Commonly known as: PEPCID  Take 20 mg by mouth 2 (two) times daily.   gabapentin  100 MG capsule Commonly known as: NEURONTIN  Take 100 mg by mouth 2 (two) times daily.   methocarbamol  500 MG tablet Commonly known as: ROBAXIN  Take 500 mg by mouth daily.   oxyCODONE  5 MG immediate release tablet Commonly known as: Oxy IR/ROXICODONE  Take 1 tablet (5 mg total) by mouth every 6 (six) hours as needed for severe pain (pain score 7-10). What changed: Another medication with the same name was added. Make sure you understand how and when to take each.   oxyCODONE  5 MG immediate release tablet Commonly known as: Oxy IR/ROXICODONE  Take 1 tablet (5 mg total) by mouth every 6 (six) hours as needed for severe pain (pain score 7-10). What changed: You were already taking a medication with the same name, and this prescription was added. Make sure you understand how and when to take each.   potassium chloride  10 MEQ tablet Commonly known as: KLOR-CON  Take 2 tablets (20 mEq total) by mouth daily.   pravastatin 80 MG tablet Commonly known as: PRAVACHOL Take 80 mg by mouth daily.   Vitamin D -3 25 MCG (1000 UT) Caps Take 1 capsule by mouth daily.         Signed: [name] 02/24/2024, 8:38 AM

## 2024-02-24 NOTE — Anesthesia Postprocedure Evaluation (Signed)
 Anesthesia Post Note  Patient: Sandy Morales  Procedure(s) Performed: EVACUATION HEMATOMA (Right)     Patient location during evaluation: PACU Anesthesia Type: General Level of consciousness: sedated and patient cooperative Pain management: pain level controlled Vital Signs Assessment: post-procedure vital signs reviewed and stable Respiratory status: spontaneous breathing Cardiovascular status: stable Anesthetic complications: no   No notable events documented.  Last Vitals:  Vitals:   02/24/24 0559 02/24/24 0621  BP: 118/64 123/64  Pulse: 72 73  Resp: 16 18  Temp: 36.8 C 36.8 C  SpO2: 99% 99%    Last Pain:  Vitals:   02/24/24 0925  TempSrc:   PainSc: 0-No pain                 Norleen Pope

## 2024-02-27 LAB — SURGICAL PATHOLOGY

## 2024-02-28 ENCOUNTER — Encounter (HOSPITAL_COMMUNITY): Payer: Self-pay

## 2024-02-29 ENCOUNTER — Other Ambulatory Visit

## 2024-02-29 ENCOUNTER — Encounter: Admitting: Genetic Counselor

## 2024-03-02 ENCOUNTER — Encounter: Payer: Self-pay | Admitting: *Deleted

## 2024-03-02 ENCOUNTER — Telehealth: Payer: Self-pay | Admitting: *Deleted

## 2024-03-02 DIAGNOSIS — Z170 Estrogen receptor positive status [ER+]: Secondary | ICD-10-CM

## 2024-03-02 NOTE — Telephone Encounter (Signed)
 Received oncotype results of 5/5%. Referral placed for Dr. Dewey.

## 2024-03-06 ENCOUNTER — Encounter: Payer: Self-pay | Admitting: *Deleted

## 2024-03-09 NOTE — Progress Notes (Incomplete)
 Location of Breast Cancer: right breast  Histology per Pathology Report: grade 1, Invasive Lobular Carcinoma with associated LCIS.  01/11/2024                                Receptor Status: ER(pos), PR (pos), Her2-neu (neg), Ki-(20%)  Did patient present with symptoms (if so, please note symptoms) or was this found on screening mammography?:   found on screening mammogram 12/29/23  Past/Anticipated interventions by surgeon, if any: Lumpectomy on 02/14/24  Past/Anticipated interventions by medical oncology, if any: None  Lymphedema issues, if any:  {:18581} {t:21944}   Pain issues, if any:  {:18581} {PAIN DESCRIPTION:21022940}  Skin issues if any   SAFETY ISSUES: Prior radiation? no Pacemaker/ICD? no Possible current pregnancy? No, hysterectomy Is the patient on methotrexate? no  Current Complaints / other details:  ***    Dyke JULIANNA Frost, LPN 88/78/7974,1:45 AM

## 2024-03-19 NOTE — Progress Notes (Signed)
 Location of Breast Cancer: right breast  Histology per Pathology Report: grade 1, Invasive Lobular Carcinoma with associated LCIS.  01/11/2024                                Receptor Status: ER(pos), PR (pos), Her2-neu (neg), Ki-(20%)  Did patient present with symptoms (if so, please note symptoms) or was this found on screening mammography?:   found on screening mammogram 12/29/23  Past/Anticipated interventions by surgeon, if any: Lumpectomy on 02/14/24  Past/Anticipated interventions by medical oncology, if any: None  Lymphedema issues, if any:  {:18581} {t:21944}   Pain issues, if any:  {:18581} {PAIN DESCRIPTION:21022940}  Skin issues if any   SAFETY ISSUES: Prior radiation? no Pacemaker/ICD? no Possible current pregnancy? No, hysterectomy Is the patient on methotrexate? no  Current Complaints / other details:  ***    Sandy JULIANNA Frost, LPN 87/11/7972,1:40 AM

## 2024-03-20 ENCOUNTER — Ambulatory Visit: Admitting: Radiation Oncology

## 2024-03-20 ENCOUNTER — Ambulatory Visit

## 2024-03-20 NOTE — Progress Notes (Signed)
 Radiation Oncology         [phone]) [phone] ________________________________  Name: Sandy Morales        MRN: [id number]  Date of Service: 03/22/2024 DOB: 1951-01-19  CC:Shelda Atlas, MD  Loretha Ash, MD     REFERRING PHYSICIAN: Loretha Ash, MD   DIAGNOSIS: The primary encounter diagnosis was Malignant neoplasm of upper-outer quadrant of left breast in female, estrogen receptor positive (HCC). A diagnosis of Malignant neoplasm of upper-inner quadrant of right breast in female, estrogen receptor positive (HCC) was also pertinent to this visit.   HISTORY OF PRESENT ILLNESS: Sandy Morales is a 73 y.o. femaleseen at the request of Dr. Vanderbilt for a new diagnosis of  right breast cancer.  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  She also had a breast tissue expander placed at the time.  It appears that she had an incision and drainage of a hematoma, and in 2022, the patient was diagnosed with a papilloma in the right breast.  She was counseled on surgical excision and underwent a right lumpectomy with Dr. Vanderbilt on 07/17/2020 and at the time no malignancy was identified but a papillary lesion with atypical type ductal hyperplasia was noted.  She recently had a screening mammogram on 12/29/2023 that showed a possible asymmetry/architecture in the right breast and diagnostic workup showed persistence of this on spot compression views in the mid to posterior third of the retroareolar breast.  By ultrasound a mass with spiculated margins was confirmed in the 2 o'clock position measuring 8 mm in greatest dimension with an adjacent oval hypoechoic mass measuring 7 mm in greatest dimension felt to either be a complicated or simple cyst.  An incidental cyst was also seen in the 3 o'clock position measuring 7 mm in greatest dimension.  She underwent biopsy on 01/11/2024 that  showed a grade 1 invasive lobular carcinoma with associated LCIS and her cancer was ER/PR positive, HER2 negative with a Ki-67 of 20%.  She was counseled on MRI for extent of disease which was performed on 01/22/2024 and showed signal artifact in the upper inner quadrant of the right breast but no surrounding abnormal enhancement around that area was appreciated, and 2 areas of non-mass like enhancement measuring 8 and 5 mm in the upper inner quadrant were noted to be indeterminant and it was recommended that she have MRI core biopsy.  No abnormal appearing lymph nodes were present.  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    Since her last visit, she underwent a stereotactic biopsy on 02/02/2024 of calcifications in the right breast which showed intraductal papilloma with atypical ductal hyperplasia and separate multiple intraductal papillomas without evidence of malignancy. She then underwent a right lumpectomy x2 on 02/14/24. The superior specimen showed a grade 2 invasive lobular carcinoma measuring 1.4 cm with associated LCIS the anterior margin was positive for invasive disease and 0.4 mm for LCIS also to the anterior margin.  The second specimen labeled right inferior showed a grade 2 invasive lobular carcinoma measuring 6 mm.  All margins were negative the closest being 4 mm to the lateral margin, she did have 4 sentinel lymph nodes removed 1 contained isolated tumor cells and no extranodal extension or macroscopic disease. Oncotype Dx score was performed on the first specimen and the  recurrence score risk was 5.  She did have a hematoma and went back for evacuation of this and for additional margin on 02/23/2024, and final pathology from that procedure showed benign skin and breast tissue with focal small intraductal papilloma no evidence of residual disease was appreciated. Dr. Loretha does  not recommend chemotherapy, and the patient is seen to discuss adjuvant radiotherapy.    PREVIOUS RADIATION THERAPY: No   PAST MEDICAL HISTORY:  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   Dyslipidemia    GERD (gastroesophageal reflux disease)    Gout    Hypertension    Osteoarthritis    Papilloma of breast    right       PAST SURGICAL HISTORY: Past Surgical History:  Procedure Laterality Date   ABDOMINAL HYSTERECTOMY     BREAST BIOPSY Right 01/11/2024   US  RT BREAST BX W LOC DEV 1ST LESION IMG BX SPEC US  GUIDE 01/11/2024 [facility]   BREAST BIOPSY  02/10/2024   MM RT RADIOACTIVE SEED LOC MAMMO GUIDE 02/10/2024 [facility]   BREAST BIOPSY  02/10/2024   MM RT RADIOACTIVE SEED EA ADD LESION LOC MAMMO GUIDE 02/10/2024 [facility]   BREAST EXCISIONAL BIOPSY Right 07/17/2020   FLAT EPITHELIAL ATYPIA WITH CALCIFICATIONS,   BREAST LUMPECTOMY WITH RADIOACTIVE SEED AND SENTINEL LYMPH NODE BIOPSY Right 02/14/2024   Procedure: BREAST LUMPECTOMY WITH RADIOACTIVE SEED AND SENTINEL LYMPH NODE BIOPSY;  Surgeon: Vanderbilt Ned, MD;  Location: [facility];  Service: General;  Laterality: Right;  GEN w/PEC BLOCK RIGHT BREAST SEED LUMPECTOMY RIGHT SENTINEL LYMPH NODE MAPPING   BREAST LUMPECTOMY WITH RADIOACTIVE SEED LOCALIZATION Right 07/17/2020   Procedure: RIGHT BREAST LUMPECTOMY WITH RADIOACTIVE SEED LOCALIZATION;  Surgeon: Vanderbilt Ned, MD;  Location: [facility];  Service: General;  Laterality: Right;   CYST EXCISION     rt middle finger   HEMATOMA EVACUATION Right 02/23/2024   Procedure: EVACUATION HEMATOMA;  Surgeon: Vanderbilt Ned, MD;  Location: [facility] OR;  Service: General;  Laterality: Right;  EVACUATION RIGHT BREAST HEMATOMA   INCISION AND DRAINAGE OF WOUND Left 12/16/2012   Procedure: INCISION AND DRAINAGE Hematoma IN CHEST with drain  placement;  Surgeon: Alm Sick, MD;  Location: [facility] OR;  Service: Plastics;  Laterality: Left;   KNEE SURGERY     MASTECTOMY Left 04/27/2012   left breast  with tissue expander   MASTECTOMY W/ SENTINEL NODE BIOPSY  04/27/2012   Procedure: MASTECTOMY WITH SENTINEL LYMPH NODE BIOPSY;  Surgeon: Ned LABOR. Cornett, MD;  Location: [facility] OR;  Service: General;  Laterality: Left;  LEFT SIMPLE MASTECTOMY; SENTINEL LYMPH NODE BIOPSY   TISSUE EXPANDER PLACEMENT  04/27/2012   Procedure: TISSUE EXPANDER;  Surgeon: Alm Sick, MD;  Location: [facility] OR;  Service: Plastics;  Laterality: Left;  PLACEMENT OF LEFT BREAST TISSUE EXPANDER     FAMILY HISTORY:  Family History  Problem Relation Age of Onset   Cancer Mother        Unknown type of cancer   Throat cancer Father    Cancer Sister        NOS   Cancer Niece        NOS: d. < 50     SOCIAL HISTORY:  reports that she has never smoked. She has never used smokeless tobacco. She reports current alcohol use. She reports current drug use. Drug: Marijuana. The  patient is single and lives in [place]. She helps care for her grandchildren and great grandchildren. She enjoys cooking for her family and spending time at her church.    ALLERGIES: Patient has no known allergies.   MEDICATIONS:  Current Outpatient Medications  Medication Sig Dispense Refill   allopurinol  (ZYLOPRIM ) 100 MG tablet Take 100 mg by mouth daily.     benazepril  (LOTENSIN ) 10 MG tablet Take 10 mg by mouth daily.     Calcium  Carbonate (CALCIUM  600 PO) Take 600 mg by mouth daily.     Cholecalciferol  (VITAMIN D -3) 1000 UNITS CAPS Take 1 capsule by mouth daily.     diphenhydrAMINE  (BENADRYL ) 25 mg capsule Take 25 mg by mouth every 6 (six) hours as needed for itching. (Patient not taking: Reported on 02/23/2024)     docusate sodium  100 MG CAPS Take 100 mg by mouth daily. (Patient taking differently: Take 100 mg by mouth daily as needed (constipation).) 10 capsule 0   EPINEPHrine  0.3 mg/0.3  mL IJ SOAJ injection Inject 0.3 mg into the muscle as needed for anaphylaxis.     famotidine  (PEPCID ) 20 MG tablet Take 20 mg by mouth 2 (two) times daily.     gabapentin  (NEURONTIN ) 100 MG capsule Take 100 mg by mouth 2 (two) times daily.     methocarbamol  (ROBAXIN ) 500 MG tablet Take 500 mg by mouth daily.     oxyCODONE  (OXY IR/ROXICODONE ) 5 MG immediate release tablet Take 1 tablet (5 mg total) by mouth every 6 (six) hours as needed for severe pain (pain score 7-10). 15 tablet 0   oxyCODONE  (OXY IR/ROXICODONE ) 5 MG immediate release tablet Take 1 tablet (5 mg total) by mouth every 6 (six) hours as needed for severe pain (pain score 7-10). 20 tablet 0   potassium chloride  (K-DUR) 10 MEQ tablet Take 2 tablets (20 mEq total) by mouth daily. 6 tablet 0   pravastatin  (PRAVACHOL ) 80 MG tablet Take 80 mg by mouth daily.     No current facility-administered medications for this visit.     REVIEW OF SYSTEMS: On review of systems, the patient reports that she is doing ***     PHYSICAL EXAM:  Wt Readings from Last 3 Encounters:  02/22/24 175 lb 11.3 oz (79.7 kg)  02/14/24 175 lb 11.3 oz (79.7 kg)  02/07/24 (P) 179 lb (81.2 kg)   Temp Readings from Last 3 Encounters:  02/24/24 98.2 F (36.8 C) (Oral)  02/14/24 97.7 F (36.5 C) (Temporal)  02/07/24 (!) (P) 97.3 F (36.3 C) ((P) Temporal)   BP Readings from Last 3 Encounters:  02/24/24 123/64  02/14/24 (!) 162/83  02/07/24 (!) (P) 169/75   Pulse Readings from Last 3 Encounters:  02/24/24 73  02/14/24 88  02/07/24 (P) 60    In general this is a well appearing African American female in no acute distress. She's alert and oriented x4 and appropriate throughout the examination. Cardiopulmonary assessment is negative for acute distress and she exhibits normal effort. The right breast and axillary sites are evaluated and incision sites are intact without erythema, separation, or drainage. ***    ECOG = ***  0 - Asymptomatic (Fully  active, able to carry on all predisease activities without restriction)  1 - Symptomatic but completely ambulatory (Restricted in physically strenuous activity but ambulatory and able to carry out work of a light or sedentary nature. For example, light housework, office work)  2 - Symptomatic, <50% in bed during the day (Ambulatory and capable of  all self care but unable to carry out any work activities. Up and about more than 50% of waking hours)  3 - Symptomatic, >50% in bed, but not bedbound (Capable of only limited self-care, confined to bed or chair 50% or more of waking hours)  4 - Bedbound (Completely disabled. Cannot carry on any self-care. Totally confined to bed or chair)  5 - Death   Raylene MM, Creech RH, Tormey DC, et al. [phone]). Toxicity and response criteria of the [facility]. Am. DOROTHA Bridges. Oncol. 5 (6): 649-55    LABORATORY DATA:  Lab Results  Component Value Date   WBC 9.7 02/23/2024   HGB 9.8 (L) 02/23/2024   HCT 30.9 (L) 02/23/2024   MCV 102.0 (H) 02/23/2024   PLT 228 02/23/2024   Lab Results  Component Value Date   NA 139 02/23/2024   K 4.8 02/23/2024   CL 106 02/23/2024   CO2 21 (L) 02/23/2024   Lab Results  Component Value Date   ALT 14 06/20/2020   AST 19 06/20/2020   ALKPHOS 79 06/20/2020   BILITOT 1.0 06/20/2020      RADIOGRAPHY: No results found.      IMPRESSION/PLAN: 1. Stage IA multifocal, pT1cNmiM0, grade 2, ER/PR positive invasive lobular carcinoma with associated LCIS of the right breast. Dr. Dewey has reviewed the final pathology results. She is healing well after her second surgery. Dr. Loretha did not recommend chemotherapy based on the patient's oncotype dx results, but does anticipate adjuvant antiestrogen therapy to follow. Dr. Dewey recommends adjuvant radiotherapy to the breast to reduce risks of long term local recurrence. Previously we had discussed the possibility of forgoing radiation, however she does not  meet the postoperative criteria to avoid radiation. Rather Dr. Dewey recommends 4 weeks of radiation with high tangent fields given her isolated tumor cells. Because of her nodal findings she is not a candidate for ultrahypofractionated radiotherapy. We reviewed the risks, benefits, short, and long term effects of therapy. The patient is in agreement to proceed. Written consent is obtained and placed in the chart, a copy was provided to the patient. She will simulate next week.  2. History of left breast DCIS. She will be followed expectantly while proceeding with treatment for #1.      In a visit lasting 60 minutes, greater than 50% of the time was spent face to face reviewing her case, as well as in preparation of, discussing, and coordinating the patient's care.  The above documentation reflects my direct findings during this shared patient visit. Please see the separate note by Dr. Dewey on this date for the remainder of the patient's plan of care.    Donald KYM Husband, [facility]    **Disclaimer: This note was dictated with voice recognition software. Similar sounding words can inadvertently be transcribed and this note may contain transcription errors which may not have been corrected upon publication of note.**

## 2024-03-21 ENCOUNTER — Ambulatory Visit: Admitting: Radiation Oncology

## 2024-03-22 ENCOUNTER — Ambulatory Visit
Admission: RE | Admit: 2024-03-22 | Discharge: 2024-03-22 | Attending: Radiation Oncology | Admitting: Radiation Oncology

## 2024-03-22 ENCOUNTER — Ambulatory Visit
Admission: RE | Admit: 2024-03-22 | Discharge: 2024-03-22 | Disposition: A | Source: Ambulatory Visit | Attending: Radiation Oncology | Admitting: Radiation Oncology

## 2024-03-22 DIAGNOSIS — Z170 Estrogen receptor positive status [ER+]: Secondary | ICD-10-CM

## 2024-03-22 DIAGNOSIS — C50912 Malignant neoplasm of unspecified site of left female breast: Secondary | ICD-10-CM

## 2024-03-27 ENCOUNTER — Encounter: Payer: Self-pay | Admitting: *Deleted

## 2024-03-27 DIAGNOSIS — Z170 Estrogen receptor positive status [ER+]: Secondary | ICD-10-CM

## 2024-03-28 ENCOUNTER — Telehealth: Payer: Self-pay | Admitting: Hematology and Oncology

## 2024-03-28 NOTE — Telephone Encounter (Signed)
 I attempted to reach patient to schedule post xrt follow up appointment with Dr. Loretha. Patient's voicemail box is full so I mailed an appointment reminder for visit on 05/11/2024.

## 2024-03-29 ENCOUNTER — Ambulatory Visit
Admission: RE | Admit: 2024-03-29 | Discharge: 2024-03-29 | Disposition: A | Discharge status: Home / Self Care | Source: Ambulatory Visit | Attending: Radiation Oncology | Admitting: Radiation Oncology

## 2024-03-29 DIAGNOSIS — Z510 Encounter for antineoplastic radiation therapy: Secondary | ICD-10-CM | POA: Diagnosis present

## 2024-03-29 DIAGNOSIS — C50211 Malignant neoplasm of upper-inner quadrant of right female breast: Secondary | ICD-10-CM | POA: Insufficient documentation

## 2024-04-04 DIAGNOSIS — Z510 Encounter for antineoplastic radiation therapy: Secondary | ICD-10-CM | POA: Diagnosis not present

## 2024-04-09 ENCOUNTER — Other Ambulatory Visit: Payer: Self-pay

## 2024-04-09 ENCOUNTER — Ambulatory Visit
Admission: RE | Admit: 2024-04-09 | Discharge: 2024-04-09 | Attending: Radiation Oncology | Admitting: Radiation Oncology

## 2024-04-09 DIAGNOSIS — Z510 Encounter for antineoplastic radiation therapy: Secondary | ICD-10-CM | POA: Diagnosis not present

## 2024-04-09 LAB — RAD ONC ARIA SESSION SUMMARY
Course Elapsed Days: 0
Plan Fractions Treated to Date: 1
Plan Prescribed Dose Per Fraction: 2.66 Gy
Plan Total Fractions Prescribed: 16
Plan Total Prescribed Dose: 42.56 Gy
Reference Point Dosage Given to Date: 2.66 Gy
Reference Point Session Dosage Given: 2.66 Gy
Session Number: 1

## 2024-04-10 ENCOUNTER — Other Ambulatory Visit: Payer: Self-pay

## 2024-04-10 ENCOUNTER — Ambulatory Visit
Admission: RE | Admit: 2024-04-10 | Discharge: 2024-04-10 | Disposition: A | Discharge status: Home / Self Care | Source: Ambulatory Visit | Attending: Radiation Oncology | Admitting: Radiation Oncology

## 2024-04-10 DIAGNOSIS — Z510 Encounter for antineoplastic radiation therapy: Secondary | ICD-10-CM | POA: Diagnosis not present

## 2024-04-10 LAB — RAD ONC ARIA SESSION SUMMARY
Course Elapsed Days: 1
Plan Fractions Treated to Date: 2
Plan Prescribed Dose Per Fraction: 2.66 Gy
Plan Total Fractions Prescribed: 16
Plan Total Prescribed Dose: 42.56 Gy
Reference Point Dosage Given to Date: 5.32 Gy
Reference Point Session Dosage Given: 2.66 Gy
Session Number: 2

## 2024-04-11 ENCOUNTER — Ambulatory Visit
Admission: RE | Admit: 2024-04-11 | Discharge: 2024-04-11 | Disposition: A | Source: Ambulatory Visit | Attending: Radiation Oncology

## 2024-04-11 ENCOUNTER — Other Ambulatory Visit: Payer: Self-pay

## 2024-04-11 DIAGNOSIS — Z510 Encounter for antineoplastic radiation therapy: Secondary | ICD-10-CM | POA: Diagnosis not present

## 2024-04-11 DIAGNOSIS — C50211 Malignant neoplasm of upper-inner quadrant of right female breast: Secondary | ICD-10-CM

## 2024-04-11 LAB — RAD ONC ARIA SESSION SUMMARY
Course Elapsed Days: 2
Plan Fractions Treated to Date: 3
Plan Prescribed Dose Per Fraction: 2.66 Gy
Plan Total Fractions Prescribed: 16
Plan Total Prescribed Dose: 42.56 Gy
Reference Point Dosage Given to Date: 7.98 Gy
Reference Point Session Dosage Given: 2.66 Gy
Session Number: 3

## 2024-04-11 MED ORDER — ALRA NON-METALLIC DEODORANT (RAD-ONC)
1.0000 | Freq: Once | TOPICAL | Status: AC
  Administered 2024-04-11: 1 via TOPICAL

## 2024-04-11 MED ORDER — RADIAPLEXRX EX GEL: Freq: Once | CUTANEOUS | Status: AC

## 2024-04-16 ENCOUNTER — Ambulatory Visit
Admission: RE | Admit: 2024-04-16 | Discharge: 2024-04-16 | Disposition: A | Discharge status: Home / Self Care | Source: Ambulatory Visit | Attending: Radiation Oncology | Admitting: Radiation Oncology

## 2024-04-16 ENCOUNTER — Other Ambulatory Visit: Payer: Self-pay

## 2024-04-16 DIAGNOSIS — Z510 Encounter for antineoplastic radiation therapy: Secondary | ICD-10-CM | POA: Diagnosis not present

## 2024-04-16 LAB — RAD ONC ARIA SESSION SUMMARY
Course Elapsed Days: 7
Plan Fractions Treated to Date: 4
Plan Prescribed Dose Per Fraction: 2.66 Gy
Plan Total Fractions Prescribed: 16
Plan Total Prescribed Dose: 42.56 Gy
Reference Point Dosage Given to Date: 10.64 Gy
Reference Point Session Dosage Given: 2.66 Gy
Session Number: 4

## 2024-04-17 ENCOUNTER — Other Ambulatory Visit: Payer: Self-pay

## 2024-04-17 ENCOUNTER — Ambulatory Visit
Admission: RE | Admit: 2024-04-17 | Discharge: 2024-04-17 | Disposition: A | Discharge status: Home / Self Care | Source: Ambulatory Visit | Attending: Radiation Oncology | Admitting: Radiation Oncology

## 2024-04-17 DIAGNOSIS — Z510 Encounter for antineoplastic radiation therapy: Secondary | ICD-10-CM | POA: Diagnosis not present

## 2024-04-17 LAB — RAD ONC ARIA SESSION SUMMARY
Course Elapsed Days: 8
Plan Fractions Treated to Date: 5
Plan Prescribed Dose Per Fraction: 2.66 Gy
Plan Total Fractions Prescribed: 16
Plan Total Prescribed Dose: 42.56 Gy
Reference Point Dosage Given to Date: 13.3 Gy
Reference Point Session Dosage Given: 2.66 Gy
Session Number: 5

## 2024-04-18 ENCOUNTER — Other Ambulatory Visit: Payer: Self-pay

## 2024-04-18 ENCOUNTER — Ambulatory Visit
Admission: RE | Admit: 2024-04-18 | Discharge: 2024-04-18 | Disposition: A | Discharge status: Home / Self Care | Source: Ambulatory Visit | Attending: Radiation Oncology | Admitting: Radiation Oncology

## 2024-04-18 DIAGNOSIS — Z510 Encounter for antineoplastic radiation therapy: Secondary | ICD-10-CM | POA: Diagnosis not present

## 2024-04-18 LAB — RAD ONC ARIA SESSION SUMMARY
Course Elapsed Days: 9
Plan Fractions Treated to Date: 6
Plan Prescribed Dose Per Fraction: 2.66 Gy
Plan Total Fractions Prescribed: 16
Plan Total Prescribed Dose: 42.56 Gy
Reference Point Dosage Given to Date: 15.96 Gy
Reference Point Session Dosage Given: 2.66 Gy
Session Number: 6

## 2024-04-20 ENCOUNTER — Ambulatory Visit
Admission: RE | Admit: 2024-04-20 | Discharge: 2024-04-20 | Disposition: A | Discharge status: Home / Self Care | Source: Ambulatory Visit | Attending: Radiation Oncology | Admitting: Radiation Oncology

## 2024-04-20 ENCOUNTER — Ambulatory Visit
Admission: RE | Admit: 2024-04-20 | Discharge: 2024-04-20 | Disposition: A | Source: Ambulatory Visit | Attending: Radiation Oncology

## 2024-04-20 ENCOUNTER — Other Ambulatory Visit: Payer: Self-pay

## 2024-04-20 DIAGNOSIS — C50211 Malignant neoplasm of upper-inner quadrant of right female breast: Secondary | ICD-10-CM | POA: Insufficient documentation

## 2024-04-20 DIAGNOSIS — Z510 Encounter for antineoplastic radiation therapy: Secondary | ICD-10-CM | POA: Insufficient documentation

## 2024-04-20 LAB — RAD ONC ARIA SESSION SUMMARY
Course Elapsed Days: 11
Plan Fractions Treated to Date: 7
Plan Prescribed Dose Per Fraction: 2.66 Gy
Plan Total Fractions Prescribed: 16
Plan Total Prescribed Dose: 42.56 Gy
Reference Point Dosage Given to Date: 18.62 Gy
Reference Point Session Dosage Given: 2.66 Gy
Session Number: 7

## 2024-04-23 ENCOUNTER — Ambulatory Visit
Admission: RE | Admit: 2024-04-23 | Discharge: 2024-04-23 | Disposition: A | Discharge status: Home / Self Care | Source: Ambulatory Visit | Attending: Radiation Oncology | Admitting: Radiation Oncology

## 2024-04-23 ENCOUNTER — Other Ambulatory Visit: Payer: Self-pay

## 2024-04-23 LAB — RAD ONC ARIA SESSION SUMMARY
Course Elapsed Days: 14
Plan Fractions Treated to Date: 8
Plan Prescribed Dose Per Fraction: 2.66 Gy
Plan Total Fractions Prescribed: 16
Plan Total Prescribed Dose: 42.56 Gy
Reference Point Dosage Given to Date: 21.28 Gy
Reference Point Session Dosage Given: 2.66 Gy
Session Number: 8

## 2024-04-24 ENCOUNTER — Other Ambulatory Visit: Payer: Self-pay

## 2024-04-24 ENCOUNTER — Ambulatory Visit
Admission: RE | Admit: 2024-04-24 | Discharge: 2024-04-24 | Disposition: A | Source: Ambulatory Visit | Attending: Radiation Oncology

## 2024-04-24 LAB — RAD ONC ARIA SESSION SUMMARY
Course Elapsed Days: 15
Plan Fractions Treated to Date: 9
Plan Prescribed Dose Per Fraction: 2.66 Gy
Plan Total Fractions Prescribed: 16
Plan Total Prescribed Dose: 42.56 Gy
Reference Point Dosage Given to Date: 23.94 Gy
Reference Point Session Dosage Given: 2.66 Gy
Session Number: 9

## 2024-04-25 ENCOUNTER — Other Ambulatory Visit: Payer: Self-pay

## 2024-04-25 ENCOUNTER — Ambulatory Visit
Admission: RE | Admit: 2024-04-25 | Discharge: 2024-04-25 | Disposition: A | Discharge status: Home / Self Care | Source: Ambulatory Visit | Attending: Radiation Oncology | Admitting: Radiation Oncology

## 2024-04-25 LAB — RAD ONC ARIA SESSION SUMMARY
Course Elapsed Days: 16
Plan Fractions Treated to Date: 10
Plan Prescribed Dose Per Fraction: 2.66 Gy
Plan Total Fractions Prescribed: 16
Plan Total Prescribed Dose: 42.56 Gy
Reference Point Dosage Given to Date: 26.6 Gy
Reference Point Session Dosage Given: 2.66 Gy
Session Number: 10

## 2024-04-26 ENCOUNTER — Ambulatory Visit
Admission: RE | Admit: 2024-04-26 | Discharge: 2024-04-26 | Disposition: A | Discharge status: Home / Self Care | Source: Ambulatory Visit | Attending: Radiation Oncology | Admitting: Radiation Oncology

## 2024-04-26 ENCOUNTER — Other Ambulatory Visit: Payer: Self-pay

## 2024-04-26 LAB — RAD ONC ARIA SESSION SUMMARY
Course Elapsed Days: 17
Plan Fractions Treated to Date: 11
Plan Prescribed Dose Per Fraction: 2.66 Gy
Plan Total Fractions Prescribed: 16
Plan Total Prescribed Dose: 42.56 Gy
Reference Point Dosage Given to Date: 29.26 Gy
Reference Point Session Dosage Given: 2.66 Gy
Session Number: 11

## 2024-04-27 ENCOUNTER — Ambulatory Visit

## 2024-04-27 ENCOUNTER — Ambulatory Visit: Admitting: Radiation Oncology

## 2024-04-30 ENCOUNTER — Other Ambulatory Visit: Payer: Self-pay

## 2024-04-30 ENCOUNTER — Ambulatory Visit

## 2024-04-30 ENCOUNTER — Ambulatory Visit
Admission: RE | Admit: 2024-04-30 | Discharge: 2024-04-30 | Disposition: A | Source: Ambulatory Visit | Attending: Radiation Oncology

## 2024-04-30 LAB — RAD ONC ARIA SESSION SUMMARY
Course Elapsed Days: 21
Plan Fractions Treated to Date: 12
Plan Prescribed Dose Per Fraction: 2.66 Gy
Plan Total Fractions Prescribed: 16
Plan Total Prescribed Dose: 42.56 Gy
Reference Point Dosage Given to Date: 31.92 Gy
Reference Point Session Dosage Given: 2.66 Gy
Session Number: 12

## 2024-05-01 ENCOUNTER — Ambulatory Visit
Admission: RE | Admit: 2024-05-01 | Discharge: 2024-05-01 | Disposition: A | Source: Ambulatory Visit | Attending: Radiation Oncology

## 2024-05-01 ENCOUNTER — Ambulatory Visit

## 2024-05-01 ENCOUNTER — Other Ambulatory Visit: Payer: Self-pay

## 2024-05-01 LAB — RAD ONC ARIA SESSION SUMMARY
Course Elapsed Days: 22
Plan Fractions Treated to Date: 13
Plan Prescribed Dose Per Fraction: 2.66 Gy
Plan Total Fractions Prescribed: 16
Plan Total Prescribed Dose: 42.56 Gy
Reference Point Dosage Given to Date: 34.58 Gy
Reference Point Session Dosage Given: 2.66 Gy
Session Number: 13

## 2024-05-02 ENCOUNTER — Other Ambulatory Visit: Payer: Self-pay

## 2024-05-02 ENCOUNTER — Ambulatory Visit

## 2024-05-02 ENCOUNTER — Ambulatory Visit
Admission: RE | Admit: 2024-05-02 | Discharge: 2024-05-02 | Disposition: A | Discharge status: Home / Self Care | Source: Ambulatory Visit | Attending: Radiation Oncology | Admitting: Radiation Oncology

## 2024-05-02 LAB — RAD ONC ARIA SESSION SUMMARY
Course Elapsed Days: 23
Plan Fractions Treated to Date: 14
Plan Prescribed Dose Per Fraction: 2.66 Gy
Plan Total Fractions Prescribed: 16
Plan Total Prescribed Dose: 42.56 Gy
Reference Point Dosage Given to Date: 37.24 Gy
Reference Point Session Dosage Given: 2.66 Gy
Session Number: 14

## 2024-05-03 ENCOUNTER — Other Ambulatory Visit: Payer: Self-pay

## 2024-05-03 ENCOUNTER — Ambulatory Visit
Admission: RE | Admit: 2024-05-03 | Discharge: 2024-05-03 | Disposition: A | Source: Ambulatory Visit | Attending: Radiation Oncology

## 2024-05-03 LAB — RAD ONC ARIA SESSION SUMMARY
Course Elapsed Days: 24
Plan Fractions Treated to Date: 15
Plan Prescribed Dose Per Fraction: 2.66 Gy
Plan Total Fractions Prescribed: 16
Plan Total Prescribed Dose: 42.56 Gy
Reference Point Dosage Given to Date: 39.9 Gy
Reference Point Session Dosage Given: 2.66 Gy
Session Number: 15

## 2024-05-04 ENCOUNTER — Ambulatory Visit

## 2024-05-04 ENCOUNTER — Ambulatory Visit: Admitting: Radiation Oncology

## 2024-05-07 ENCOUNTER — Ambulatory Visit

## 2024-05-07 ENCOUNTER — Ambulatory Visit
Admission: RE | Admit: 2024-05-07 | Discharge: 2024-05-07 | Disposition: A | Source: Ambulatory Visit | Attending: Radiation Oncology

## 2024-05-07 ENCOUNTER — Other Ambulatory Visit: Payer: Self-pay

## 2024-05-07 LAB — RAD ONC ARIA SESSION SUMMARY
Course Elapsed Days: 28
Plan Fractions Treated to Date: 16
Plan Prescribed Dose Per Fraction: 2.66 Gy
Plan Total Fractions Prescribed: 16
Plan Total Prescribed Dose: 42.56 Gy
Reference Point Dosage Given to Date: 42.56 Gy
Reference Point Session Dosage Given: 2.66 Gy
Session Number: 16

## 2024-05-08 ENCOUNTER — Ambulatory Visit

## 2024-05-08 ENCOUNTER — Ambulatory Visit
Admission: RE | Admit: 2024-05-08 | Discharge: 2024-05-08 | Disposition: A | Source: Ambulatory Visit | Attending: Radiation Oncology | Admitting: Radiation Oncology

## 2024-05-08 ENCOUNTER — Other Ambulatory Visit: Payer: Self-pay

## 2024-05-08 LAB — RAD ONC ARIA SESSION SUMMARY
Course Elapsed Days: 29
Plan Fractions Treated to Date: 1
Plan Prescribed Dose Per Fraction: 2 Gy
Plan Total Fractions Prescribed: 4
Plan Total Prescribed Dose: 8 Gy
Reference Point Dosage Given to Date: 2 Gy
Reference Point Session Dosage Given: 2 Gy
Session Number: 17

## 2024-05-09 ENCOUNTER — Ambulatory Visit
Admission: RE | Admit: 2024-05-09 | Discharge: 2024-05-09 | Disposition: A | Source: Ambulatory Visit | Attending: Radiation Oncology

## 2024-05-09 ENCOUNTER — Other Ambulatory Visit: Payer: Self-pay

## 2024-05-09 ENCOUNTER — Ambulatory Visit

## 2024-05-09 LAB — RAD ONC ARIA SESSION SUMMARY
Course Elapsed Days: 30
Plan Fractions Treated to Date: 2
Plan Prescribed Dose Per Fraction: 2 Gy
Plan Total Fractions Prescribed: 4
Plan Total Prescribed Dose: 8 Gy
Reference Point Dosage Given to Date: 4 Gy
Reference Point Session Dosage Given: 2 Gy
Session Number: 18

## 2024-05-10 ENCOUNTER — Other Ambulatory Visit: Payer: Self-pay

## 2024-05-10 ENCOUNTER — Ambulatory Visit

## 2024-05-10 ENCOUNTER — Ambulatory Visit
Admission: RE | Admit: 2024-05-10 | Discharge: 2024-05-10 | Disposition: A | Discharge status: Home / Self Care | Source: Ambulatory Visit | Attending: Radiation Oncology | Admitting: Radiation Oncology

## 2024-05-10 LAB — RAD ONC ARIA SESSION SUMMARY
Course Elapsed Days: 31
Plan Fractions Treated to Date: 3
Plan Prescribed Dose Per Fraction: 2 Gy
Plan Total Fractions Prescribed: 4
Plan Total Prescribed Dose: 8 Gy
Reference Point Dosage Given to Date: 6 Gy
Reference Point Session Dosage Given: 2 Gy
Session Number: 19

## 2024-05-11 ENCOUNTER — Inpatient Hospital Stay: Attending: Hematology and Oncology | Admitting: Hematology and Oncology

## 2024-05-11 ENCOUNTER — Other Ambulatory Visit: Payer: Self-pay

## 2024-05-11 ENCOUNTER — Ambulatory Visit
Admission: RE | Admit: 2024-05-11 | Discharge: 2024-05-11 | Disposition: A | Source: Ambulatory Visit | Attending: Radiation Oncology

## 2024-05-11 VITALS — BP 138/82 | HR 80 | Temp 99.1°F | Resp 16 | Ht 60.0 in | Wt 178.1 lb

## 2024-05-11 DIAGNOSIS — Z170 Estrogen receptor positive status [ER+]: Secondary | ICD-10-CM | POA: Diagnosis not present

## 2024-05-11 DIAGNOSIS — C50211 Malignant neoplasm of upper-inner quadrant of right female breast: Secondary | ICD-10-CM

## 2024-05-11 LAB — RAD ONC ARIA SESSION SUMMARY
Course Elapsed Days: 32
Plan Fractions Treated to Date: 4
Plan Prescribed Dose Per Fraction: 2 Gy
Plan Total Fractions Prescribed: 4
Plan Total Prescribed Dose: 8 Gy
Reference Point Dosage Given to Date: 8 Gy
Reference Point Session Dosage Given: 2 Gy
Session Number: 20

## 2024-05-11 MED ORDER — LETROZOLE 2.5 MG PO TABS: 2.5000 mg | ORAL_TABLET | Freq: Every day | ORAL | 3 refills | Status: AC

## 2024-05-11 NOTE — Progress Notes (Signed)
 [facility] CONSULT NOTE  Patient Care Team: Shelda Atlas, MD as PCP - General (Internal Medicine) Gerome, Devere HERO, RN as Oncology Nurse Navigator Tyree Nanetta SAILOR, RN as Oncology Nurse Navigator Dewey Rush, MD as Consulting Physician (Radiation Oncology) Vanderbilt Ned, MD as Consulting Physician (General Surgery) Loretha Ash, MD as Consulting Physician (Hematology and Oncology)  CHIEF COMPLAINTS/PURPOSE OF CONSULTATION:  Newly diagnosed breast cancer  HISTORY OF PRESENTING ILLNESS:  Sandy Morales 74 y.o. female is here because of recent diagnosis of right ILC  I reviewed her records extensively and collaborated the history with the patient.  SUMMARY OF ONCOLOGIC HISTORY: Oncology History  Malignant neoplasm of upper-inner quadrant of right breast in female, estrogen receptor positive (HCC)  01/25/2024 Initial Diagnosis   Malignant neoplasm of upper-inner quadrant of right breast in female, estrogen receptor positive (HCC)   01/31/2024 Cancer Staging   Staging form: Breast, AJCC 8th Edition - Clinical stage from 01/31/2024: Stage IA (cT1b, cN0, cM0, G1, ER+, PR+, HER2-) - Signed by Lanell Donald Stagger, PA-C on 01/31/2024 Stage prefix: Initial diagnosis Method of lymph node assessment: Clinical Histologic grading system: 3 grade system   03/20/2024 Cancer Staging   Staging form: Breast, AJCC 8th Edition - Pathologic stage from 03/20/2024: Stage IA (pT1c, pN0(i+)(sn), cM0, G2, ER+, PR+, HER2-, Oncotype DX score: 4) - Signed by Lanell Donald Stagger, PA-C on 03/20/2024 Stage prefix: Initial diagnosis Method of lymph node assessment: Sentinel lymph node biopsy Multigene prognostic tests performed: Oncotype DX Recurrence score range: Less than 11 Histologic grading system: 3 grade system     Discussed the use of AI scribe software for clinical note transcription with the patient, who gave verbal consent to proceed.  History of Present  Illness     MEDICAL HISTORY:  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   Dyslipidemia    GERD (gastroesophageal reflux disease)    Gout    Hypertension    Osteoarthritis    Papilloma of breast    right    SURGICAL HISTORY: Past Surgical History:  Procedure Laterality Date   ABDOMINAL HYSTERECTOMY     BREAST BIOPSY Right 01/11/2024   US  RT BREAST BX W LOC DEV 1ST LESION IMG BX SPEC US  GUIDE 01/11/2024 [facility]   BREAST BIOPSY  02/10/2024   MM RT RADIOACTIVE SEED LOC MAMMO GUIDE 02/10/2024 [facility]   BREAST BIOPSY  02/10/2024   MM RT RADIOACTIVE SEED EA ADD LESION LOC MAMMO GUIDE 02/10/2024 [facility]   BREAST EXCISIONAL BIOPSY Right 07/17/2020   FLAT EPITHELIAL ATYPIA WITH CALCIFICATIONS,   BREAST LUMPECTOMY WITH RADIOACTIVE SEED AND SENTINEL LYMPH NODE BIOPSY Right 02/14/2024   Procedure: BREAST LUMPECTOMY WITH RADIOACTIVE SEED AND SENTINEL LYMPH NODE BIOPSY;  Surgeon: Vanderbilt Ned, MD;  Location: [facility];  Service: General;  Laterality: Right;  GEN w/PEC BLOCK RIGHT BREAST SEED LUMPECTOMY RIGHT SENTINEL LYMPH NODE MAPPING   BREAST LUMPECTOMY WITH RADIOACTIVE SEED LOCALIZATION Right 07/17/2020   Procedure: RIGHT BREAST LUMPECTOMY WITH RADIOACTIVE SEED LOCALIZATION;  Surgeon: Vanderbilt Ned, MD;  Location: [facility];  Service: General;  Laterality: Right;   CYST EXCISION     rt middle finger   HEMATOMA EVACUATION Right 02/23/2024   Procedure: EVACUATION HEMATOMA;  Surgeon: Vanderbilt Ned, MD;  Location: [facility];  Service: General;  Laterality: Right;  EVACUATION RIGHT BREAST HEMATOMA  INCISION AND DRAINAGE OF WOUND Left 12/16/2012   Procedure: INCISION AND DRAINAGE Hematoma IN CHEST with drain placement;  Surgeon: Alm Sick, MD;  Location: [facility] OR;  Service: Plastics;  Laterality: Left;   KNEE SURGERY     MASTECTOMY  Left 04/27/2012   left breast  with tissue expander   MASTECTOMY W/ SENTINEL NODE BIOPSY  04/27/2012   Procedure: MASTECTOMY WITH SENTINEL LYMPH NODE BIOPSY;  Surgeon: Debby LABOR. Cornett, MD;  Location: [facility] OR;  Service: General;  Laterality: Left;  LEFT SIMPLE MASTECTOMY; SENTINEL LYMPH NODE BIOPSY   TISSUE EXPANDER PLACEMENT  04/27/2012   Procedure: TISSUE EXPANDER;  Surgeon: Alm Sick, MD;  Location: [facility] OR;  Service: Plastics;  Laterality: Left;  PLACEMENT OF LEFT BREAST TISSUE EXPANDER    SOCIAL HISTORY: Social History   Socioeconomic History   Marital status: Single    Spouse name: Not on file   Number of children: Not on file   Years of education: Not on file   Highest education level: Not on file  Occupational History   Not on file  Tobacco Use   Smoking status: Never   Smokeless tobacco: Never   Tobacco comments:    Uses marijuana    Vaping Use   Vaping status: Never Used  Substance and Sexual Activity   Alcohol use: Yes    Comment: beer   Drug use: Yes    Types: Marijuana    Comment: last smoked 02-05-24   Sexual activity: Yes    Birth control/protection: Surgical    Comment: Hysterectomy  Other Topics Concern   Not on file  Social History Narrative   Not on file   Social Drivers of Health   Tobacco Use: Low Risk  (05/09/2024)   Received from [facility]   Patient History    Smoking Tobacco Use: Never    Smokeless Tobacco Use: Never    Passive Exposure: Not on file  Financial Resource Strain: Not on file  Food Insecurity: Food Insecurity Present (02/06/2024)   Epic    Worried About Programme Researcher, Broadcasting/film/video in the Last Year: Never true    Ran Out of Food in the Last Year: Sometimes true  Transportation Needs: No Transportation Needs (02/09/2024)   Epic    Lack of Transportation (Medical): No    Lack of Transportation (Non-Medical): No  Recent Concern: Transportation Needs - Unmet Transportation Needs (02/06/2024)   Epic    Lack of  Transportation (Medical): Yes    Lack of Transportation (Non-Medical): No  Physical Activity: Not on file  Stress: Not on file  Social Connections: Not on file  Intimate Partner Violence: Not At Risk (02/06/2024)   Epic    Fear of Current or Ex-Partner: No    Emotionally Abused: No    Physically Abused: No    Sexually Abused: No  Depression (PHQ2-9): Low Risk (03/22/2024)   Depression (PHQ2-9)    PHQ-2 Score: 0  Alcohol Screen: Not on file  Housing: Low Risk (02/06/2024)   Epic    Unable to Pay for Housing in the Last Year: No    Number of Times Moved in the Last Year: 0    Homeless in the Last Year: No  Utilities: Not At Risk (02/06/2024)   Epic    Threatened with loss of utilities: No  Health Literacy: Not on file    FAMILY HISTORY: Family History  Problem Relation Age of Onset   Cancer Mother  Unknown type of cancer   Throat cancer Father    Cancer Sister        NOS   Cancer Niece        NOS: d. < 50    ALLERGIES:  has no known allergies.  MEDICATIONS:  Current Outpatient Medications  Medication Sig Dispense Refill   allopurinol  (ZYLOPRIM ) 100 MG tablet Take 100 mg by mouth daily.     benazepril  (LOTENSIN ) 10 MG tablet Take 10 mg by mouth daily.     Calcium  Carbonate (CALCIUM  600 PO) Take 600 mg by mouth daily.     Cholecalciferol  (VITAMIN D -3) 1000 UNITS CAPS Take 1 capsule by mouth daily.     docusate sodium  100 MG CAPS Take 100 mg by mouth daily. 10 capsule 0   EPINEPHrine  0.3 mg/0.3 mL IJ SOAJ injection Inject 0.3 mg into the muscle as needed for anaphylaxis.     famotidine  (PEPCID ) 20 MG tablet Take 20 mg by mouth 2 (two) times daily.     gabapentin  (NEURONTIN ) 100 MG capsule Take 100 mg by mouth 2 (two) times daily.     letrozole (FEMARA) 2.5 MG tablet Take 1 tablet (2.5 mg total) by mouth daily. 90 tablet 3   oxyCODONE  (OXY IR/ROXICODONE ) 5 MG immediate release tablet Take 1 tablet (5 mg total) by mouth every 6 (six) hours as needed for severe pain  (pain score 7-10). 20 tablet 0   potassium chloride  (K-DUR) 10 MEQ tablet Take 2 tablets (20 mEq total) by mouth daily. 6 tablet 0   pravastatin  (PRAVACHOL ) 80 MG tablet Take 80 mg by mouth daily.     No current facility-administered medications for this visit.    REVIEW OF SYSTEMS:   Constitutional: Denies fevers, chills or abnormal night sweats Eyes: Denies blurriness of vision, double vision or watery eyes Ears, nose, mouth, throat, and face: Denies mucositis or sore throat Respiratory: Denies cough, dyspnea or wheezes Cardiovascular: Denies palpitation, chest discomfort or lower extremity swelling Gastrointestinal:  Denies nausea, heartburn or change in bowel habits Skin: Denies abnormal skin rashes Lymphatics: Denies new lymphadenopathy or easy bruising Neurological:Denies numbness, tingling or new weaknesses Behavioral/Psych: Mood is stable, no new changes  Breast:  Denies any palpable lumps or discharge All other systems were reviewed with the patient and are negative.  PHYSICAL EXAMINATION: ECOG PERFORMANCE STATUS: 0 - Asymptomatic  Vitals:   05/11/24 1015  BP: 138/82  Pulse: 80  Resp: 16  Temp: 99.1 F (37.3 C)  SpO2: 100%   Filed Weights   05/11/24 1015  Weight: 178 lb 1.6 oz (80.8 kg)    GENERAL:alert, no distress and comfortable Right breast with post biopsy changes. No def palpable mass. No regional adenopathy Left breast s/p mastectomy and implant. No concern for local recurrence CTA bilaterally RRR No LE edema.  LABORATORY DATA:  I have reviewed the data as listed Lab Results  Component Value Date   WBC 9.7 02/23/2024   HGB 9.8 (L) 02/23/2024   HCT 30.9 (L) 02/23/2024   MCV 102.0 (H) 02/23/2024   PLT 228 02/23/2024   Lab Results  Component Value Date   NA 139 02/23/2024   K 4.8 02/23/2024   CL 106 02/23/2024   CO2 21 (L) 02/23/2024    RADIOGRAPHIC STUDIES: I have personally reviewed the radiological reports and agreed with the findings in  the report. Assessment & Plan      All questions were answered. The patient knows to call the clinic with any problems, questions  or concerns.    Amber Stalls, MD 05/11/24

## 2024-05-11 NOTE — Progress Notes (Signed)
 [facility] CONSULT NOTE  Patient Care Team: Shelda Atlas, MD as PCP - General (Internal Medicine) Gerome, Devere HERO, RN as Oncology Nurse Navigator Tyree Nanetta SAILOR, RN as Oncology Nurse Navigator Dewey Rush, MD as Consulting Physician (Radiation Oncology) Vanderbilt Ned, MD as Consulting Physician (General Surgery) Loretha Ash, MD as Consulting Physician (Hematology and Oncology)  CHIEF COMPLAINTS/PURPOSE OF CONSULTATION:  Newly diagnosed breast cancer  HISTORY OF PRESENTING ILLNESS:  Sandy Morales 74 y.o. female is here because of recent diagnosis of right ILC  I reviewed her records extensively and collaborated the history with the patient.  SUMMARY OF ONCOLOGIC HISTORY: Oncology History  Malignant neoplasm of upper-inner quadrant of right breast in female, estrogen receptor positive (HCC)  01/25/2024 Initial Diagnosis   Malignant neoplasm of upper-inner quadrant of right breast in female, estrogen receptor positive (HCC)   01/31/2024 Cancer Staging   Staging form: Breast, AJCC 8th Edition - Clinical stage from 01/31/2024: Stage IA (cT1b, cN0, cM0, G1, ER+, PR+, HER2-) - Signed by Lanell Donald Stagger, PA-C on 01/31/2024 Stage prefix: Initial diagnosis Method of lymph node assessment: Clinical Histologic grading system: 3 grade system   03/20/2024 Cancer Staging   Staging form: Breast, AJCC 8th Edition - Pathologic stage from 03/20/2024: Stage IA (pT1c, pN0(i+)(sn), cM0, G2, ER+, PR+, HER2-, Oncotype DX score: 4) - Signed by Lanell Donald Stagger, PA-C on 03/20/2024 Stage prefix: Initial diagnosis Method of lymph node assessment: Sentinel lymph node biopsy Multigene prognostic tests performed: Oncotype DX Recurrence score range: Less than 11 Histologic grading system: 3 grade system     Discussed the use of AI scribe software for clinical note transcription with the patient, who gave verbal consent to proceed.  History of Present  Illness  Sandy Morales is a 74 year old female with right breast invasive lobular carcinoma who presents for oncology follow-up and initiation of adjuvant aromatase inhibitor therapy.  She underwent two right breast lumpectomies, the first on October 28th for a 1.47 cm invasive lobular carcinoma with a positive anterior margin, followed by re-excision in November to achieve negative margins. A second 6 mm focus of invasive lobular carcinoma in the right inferior breast was also excised with negative margins. One lymph node contained isolated tumor cells; three additional lymph nodes were negative. Pathologic staging was T1cN0(i+). She completed adjuvant radiation therapy.  Oncotype DX testing demonstrated a score of 5. She has not yet initiated adjuvant endocrine therapy. She previously tolerated similar medication without notable adverse effects and is aware of potential side effects, including hot flushes, morning stiffness, and vaginal dryness, but did not experience these previously. She is not currently receiving a bone strengthener.  She has not yet scheduled the bone density scan due to conflicts with radiation appointments and is seeking assistance in obtaining contact information to arrange this.    MEDICAL HISTORY:  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   Dyslipidemia    GERD (gastroesophageal reflux disease)    Gout    Hypertension    Osteoarthritis    Papilloma of breast    right    SURGICAL HISTORY: Past Surgical History:  Procedure Laterality Date   ABDOMINAL HYSTERECTOMY     BREAST BIOPSY Right 01/11/2024   US  RT BREAST BX W LOC DEV 1ST LESION IMG BX SPEC US  GUIDE 01/11/2024 [facility]   BREAST BIOPSY  02/10/2024   MM  RT RADIOACTIVE SEED LOC MAMMO GUIDE 02/10/2024 [facility]   BREAST BIOPSY  02/10/2024   MM RT RADIOACTIVE SEED EA ADD LESION LOC MAMMO  GUIDE 02/10/2024 [facility]   BREAST EXCISIONAL BIOPSY Right 07/17/2020   FLAT EPITHELIAL ATYPIA WITH CALCIFICATIONS,   BREAST LUMPECTOMY WITH RADIOACTIVE SEED AND SENTINEL LYMPH NODE BIOPSY Right 02/14/2024   Procedure: BREAST LUMPECTOMY WITH RADIOACTIVE SEED AND SENTINEL LYMPH NODE BIOPSY;  Surgeon: Vanderbilt Ned, MD;  Location: [facility];  Service: General;  Laterality: Right;  GEN w/PEC BLOCK RIGHT BREAST SEED LUMPECTOMY RIGHT SENTINEL LYMPH NODE MAPPING   BREAST LUMPECTOMY WITH RADIOACTIVE SEED LOCALIZATION Right 07/17/2020   Procedure: RIGHT BREAST LUMPECTOMY WITH RADIOACTIVE SEED LOCALIZATION;  Surgeon: Vanderbilt Ned, MD;  Location: [facility];  Service: General;  Laterality: Right;   CYST EXCISION     rt middle finger   HEMATOMA EVACUATION Right 02/23/2024   Procedure: EVACUATION HEMATOMA;  Surgeon: Vanderbilt Ned, MD;  Location: [facility];  Service: General;  Laterality: Right;  EVACUATION RIGHT BREAST HEMATOMA   INCISION AND DRAINAGE OF WOUND Left 12/16/2012   Procedure: INCISION AND DRAINAGE Hematoma IN CHEST with drain placement;  Surgeon: Alm Sick, MD;  Location: [facility] OR;  Service: Plastics;  Laterality: Left;   KNEE SURGERY     MASTECTOMY Left 04/27/2012   left breast  with tissue expander   MASTECTOMY W/ SENTINEL NODE BIOPSY  04/27/2012   Procedure: MASTECTOMY WITH SENTINEL LYMPH NODE BIOPSY;  Surgeon: Ned LABOR. Cornett, MD;  Location: [facility];  Service: General;  Laterality: Left;  LEFT SIMPLE MASTECTOMY; SENTINEL LYMPH NODE BIOPSY   TISSUE EXPANDER PLACEMENT  04/27/2012   Procedure: TISSUE EXPANDER;  Surgeon: Alm Sick, MD;  Location: [facility] OR;  Service: Plastics;  Laterality: Left;  PLACEMENT OF LEFT BREAST TISSUE EXPANDER    SOCIAL HISTORY: Social History   Socioeconomic History   Marital status: Single    Spouse name: Not on file   Number of children: Not on file   Years of education: Not on file   Highest education level: Not  on file  Occupational History   Not on file  Tobacco Use   Smoking status: Never   Smokeless tobacco: Never   Tobacco comments:    Uses marijuana    Vaping Use   Vaping status: Never Used  Substance and Sexual Activity   Alcohol use: Yes    Comment: beer   Drug use: Yes    Types: Marijuana    Comment: last smoked 02-05-24   Sexual activity: Yes    Birth control/protection: Surgical    Comment: Hysterectomy  Other Topics Concern   Not on file  Social History Narrative   Not on file   Social Drivers of Health   Tobacco Use: Low Risk  (05/09/2024)   Received from [facility]   Patient History    Smoking Tobacco Use: Never    Smokeless Tobacco Use: Never    Passive Exposure: Not on file  Financial Resource Strain: Not on file  Food Insecurity: Food Insecurity Present (02/06/2024)   Epic    Worried About Programme Researcher, Broadcasting/film/video in the Last Year: Never true    Ran Out of Food in the Last Year: Sometimes true  Transportation Needs: No Transportation Needs (02/09/2024)   Epic    Lack of Transportation (Medical): No    Lack of Transportation (Non-Medical): No  Recent Concern: Transportation Needs - Unmet Transportation Needs (02/06/2024)   Epic  Lack of Transportation (Medical): Yes    Lack of Transportation (Non-Medical): No  Physical Activity: Not on file  Stress: Not on file  Social Connections: Not on file  Intimate Partner Violence: Not At Risk (02/06/2024)   Epic    Fear of Current or Ex-Partner: No    Emotionally Abused: No    Physically Abused: No    Sexually Abused: No  Depression (PHQ2-9): Low Risk (03/22/2024)   Depression (PHQ2-9)    PHQ-2 Score: 0  Alcohol Screen: Not on file  Housing: Low Risk (02/06/2024)   Epic    Unable to Pay for Housing in the Last Year: No    Number of Times Moved in the Last Year: 0    Homeless in the Last Year: No  Utilities: Not At Risk (02/06/2024)   Epic    Threatened with loss of utilities: No  Health  Literacy: Not on file    FAMILY HISTORY: Family History  Problem Relation Age of Onset   Cancer Mother        Unknown type of cancer   Throat cancer Father    Cancer Sister        NOS   Cancer Niece        NOS: d. < 50    ALLERGIES:  has no known allergies.  MEDICATIONS:  Current Outpatient Medications  Medication Sig Dispense Refill   allopurinol  (ZYLOPRIM ) 100 MG tablet Take 100 mg by mouth daily.     benazepril  (LOTENSIN ) 10 MG tablet Take 10 mg by mouth daily.     Calcium  Carbonate (CALCIUM  600 PO) Take 600 mg by mouth daily.     Cholecalciferol  (VITAMIN D -3) 1000 UNITS CAPS Take 1 capsule by mouth daily.     docusate sodium  100 MG CAPS Take 100 mg by mouth daily. 10 capsule 0   EPINEPHrine  0.3 mg/0.3 mL IJ SOAJ injection Inject 0.3 mg into the muscle as needed for anaphylaxis.     famotidine  (PEPCID ) 20 MG tablet Take 20 mg by mouth 2 (two) times daily.     gabapentin  (NEURONTIN ) 100 MG capsule Take 100 mg by mouth 2 (two) times daily.     letrozole (FEMARA) 2.5 MG tablet Take 1 tablet (2.5 mg total) by mouth daily. 90 tablet 3   oxyCODONE  (OXY IR/ROXICODONE ) 5 MG immediate release tablet Take 1 tablet (5 mg total) by mouth every 6 (six) hours as needed for severe pain (pain score 7-10). 20 tablet 0   potassium chloride  (K-DUR) 10 MEQ tablet Take 2 tablets (20 mEq total) by mouth daily. 6 tablet 0   pravastatin  (PRAVACHOL ) 80 MG tablet Take 80 mg by mouth daily.     No current facility-administered medications for this visit.    REVIEW OF SYSTEMS:   Constitutional: Denies fevers, chills or abnormal night sweats Eyes: Denies blurriness of vision, double vision or watery eyes Ears, nose, mouth, throat, and face: Denies mucositis or sore throat Respiratory: Denies cough, dyspnea or wheezes Cardiovascular: Denies palpitation, chest discomfort or lower extremity swelling Gastrointestinal:  Denies nausea, heartburn or change in bowel habits Skin: Denies abnormal skin  rashes Lymphatics: Denies new lymphadenopathy or easy bruising Neurological:Denies numbness, tingling or new weaknesses Behavioral/Psych: Mood is stable, no new changes  Breast:  Denies any palpable lumps or discharge All other systems were reviewed with the patient and are negative.  PHYSICAL EXAMINATION: ECOG PERFORMANCE STATUS: 0 - Asymptomatic  Vitals:   05/11/24 1015  BP: 138/82  Pulse: 80  Resp: 16  Temp: 99.1 F (37.3 C)  SpO2: 100%    Filed Weights   05/11/24 1015  Weight: 178 lb 1.6 oz (80.8 kg)     GENERAL:alert, no distress and comfortable Right breast with post biopsy changes. No def palpable mass. No regional adenopathy Left breast s/p mastectomy and implant. No concern for local recurrence CTA bilaterally RRR No LE edema.  LABORATORY DATA:  I have reviewed the data as listed Lab Results  Component Value Date   WBC 9.7 02/23/2024   HGB 9.8 (L) 02/23/2024   HCT 30.9 (L) 02/23/2024   MCV 102.0 (H) 02/23/2024   PLT 228 02/23/2024   Lab Results  Component Value Date   NA 139 02/23/2024   K 4.8 02/23/2024   CL 106 02/23/2024   CO2 21 (L) 02/23/2024    RADIOGRAPHIC STUDIES: I have personally reviewed the radiological reports and agreed with the findings in the report.  ASSESSMENT AND PLAN:   Assessment and Plan Assessment & Plan Invasive lobular carcinoma of right breast, estrogen and progesterone receptor positive, her 2 neg Previous left breast cancer treated with mastectomy and anastrozole .  Right breast invasive lobular carcinoma Post-lumpectomy with negative margins, isolated tumor cells in one lymph node, low recurrence risk per Oncotype DX score. Completed radiation therapy. Hormone receptor positive, indicating need for adjuvant endocrine therapy. - Initiated letrozole  orally once daily for seven to ten yrs - Discussed letrozole  side effects and emphasized daily adherence.  Aromatase inhibitor therapy and bone health monitoring -  Ordered bone density (DEXA) scan. - Instructed to schedule bone density scan post-radiation. - Plan to monitor bone density every two years. - Provided pharmacy information for letrozole  prescription.  All questions were answered. The patient knows to call the clinic with any problems, questions or concerns.    Amber Stalls, MD 05/11/24

## 2024-05-14 NOTE — Radiation Completion Notes (Signed)
 Patient Name: Sandy Morales, Sandy Morales MRN: [id number] Date of Birth: 07/25/1950 Referring Physician: DEBBY SHIPPER, M.D. Date of Service: 2024-05-14 Radiation Oncologist: Norleen Limes, M.D. [facility]                             RADIATION ONCOLOGY END OF TREATMENT NOTE     Diagnosis: C50.211 Malignant neoplasm of upper-inner quadrant of right female breast Staging on 2012-01-26: Malignant neoplasm of upper-outer quadrant of left breast in female, estrogen receptor positive (HCC) T=Tis, N=N1a, M=cM0 Staging on 2024-03-20: Malignant neoplasm of upper-inner quadrant of right breast in female, estrogen receptor positive (HCC) T=pT1c, N=pN0(i+), M=cM0 Staging on 2024-01-31: Malignant neoplasm of upper-inner quadrant of right breast in female, estrogen receptor positive (HCC) T=cT1b, N=cN0, M=cM0 Intent: Curative     ==========DELIVERED PLANS==========  First Treatment Date: 2024-04-09 Last Treatment Date: 2024-05-11   Plan Name: Breast_R Site: Breast, Right Technique: 3D Mode: Photon Dose Per Fraction: 2.66 Gy Prescribed Dose (Delivered / Prescribed): 42.56 Gy / 42.56 Gy Prescribed Fxs (Delivered / Prescribed): 16 / 16   Plan Name: Breast_R_Bst Site: Breast, Right Technique: 3D Mode: Photon Dose Per Fraction: 2 Gy Prescribed Dose (Delivered / Prescribed): 8 Gy / 8 Gy Prescribed Fxs (Delivered / Prescribed): 4 / 4     ==========ON TREATMENT VISIT DATES========== 2024-04-11, 2024-04-20, 2024-04-30, 2024-05-11     ==========UPCOMING VISITS========== 08/08/2024 [facility] NURSE EVAL [facility] NURSE VISIT  08/08/2024 [facility] SURVIVORSHIP CARE PLAN VISIT Crawford Morna Pickle, NP  06/11/2024 [facility] ONC POST TREATMENT CALL [facility]        ==========APPENDIX - ON TREATMENT VISIT NOTES==========   See weekly On Treatment Notes in Epic for details in the Media tab (listed as Progress notes on the On  Treatment Visit Dates listed above).

## 2024-06-11 ENCOUNTER — Ambulatory Visit

## 2024-07-30 ENCOUNTER — Inpatient Hospital Stay: Admitting: Adult Health

## 2024-07-30 ENCOUNTER — Inpatient Hospital Stay

## 2024-08-08 ENCOUNTER — Inpatient Hospital Stay

## 2024-08-08 ENCOUNTER — Inpatient Hospital Stay: Attending: Hematology and Oncology | Admitting: Adult Health
